# Patient Record
Sex: Female | Born: 1960 | Race: Black or African American | Hispanic: No | Marital: Single | Smoking: Never smoker
Health system: Southern US, Community
[De-identification: ages and names within clinical notes are randomized; demographics above are authoritative.]

## PROBLEM LIST (undated history)

## (undated) DIAGNOSIS — I639 Cerebral infarction, unspecified: Secondary | ICD-10-CM

## (undated) DIAGNOSIS — T7840XA Allergy, unspecified, initial encounter: Secondary | ICD-10-CM

## (undated) DIAGNOSIS — J309 Allergic rhinitis, unspecified: Secondary | ICD-10-CM

## (undated) DIAGNOSIS — E559 Vitamin D deficiency, unspecified: Secondary | ICD-10-CM

## (undated) DIAGNOSIS — I10 Essential (primary) hypertension: Secondary | ICD-10-CM

## (undated) HISTORY — DX: Vitamin D deficiency, unspecified: E55.9

## (undated) HISTORY — DX: Allergy, unspecified, initial encounter: T78.40XA

## (undated) HISTORY — DX: Allergic rhinitis, unspecified: J30.9

## (undated) HISTORY — DX: Essential (primary) hypertension: I10

## (undated) HISTORY — PX: TOTAL ABDOMINAL HYSTERECTOMY W/ BILATERAL SALPINGOOPHORECTOMY: SHX83

## (undated) HISTORY — PX: ABDOMINAL HYSTERECTOMY: SHX81

## (undated) HISTORY — DX: Cerebral infarction, unspecified: I63.9

---

## 1998-02-22 ENCOUNTER — Emergency Department (HOSPITAL_COMMUNITY): Admission: EM | Admit: 1998-02-22 | Discharge: 1998-02-22 | Payer: Self-pay | Admitting: Emergency Medicine

## 1998-02-22 ENCOUNTER — Encounter: Payer: Self-pay | Admitting: Emergency Medicine

## 1999-03-17 ENCOUNTER — Other Ambulatory Visit: Admission: RE | Admit: 1999-03-17 | Discharge: 1999-03-17 | Payer: Self-pay | Admitting: Internal Medicine

## 1999-04-22 ENCOUNTER — Encounter: Payer: Self-pay | Admitting: Internal Medicine

## 1999-04-22 ENCOUNTER — Ambulatory Visit (HOSPITAL_COMMUNITY): Admission: RE | Admit: 1999-04-22 | Discharge: 1999-04-22 | Payer: Self-pay | Admitting: Internal Medicine

## 2002-01-04 ENCOUNTER — Emergency Department (HOSPITAL_COMMUNITY): Admission: EM | Admit: 2002-01-04 | Discharge: 2002-01-04 | Payer: Self-pay | Admitting: Emergency Medicine

## 2002-10-13 ENCOUNTER — Encounter: Admission: RE | Admit: 2002-10-13 | Discharge: 2002-10-13 | Payer: Self-pay | Admitting: Internal Medicine

## 2002-10-13 ENCOUNTER — Encounter: Payer: Self-pay | Admitting: Internal Medicine

## 2004-08-17 ENCOUNTER — Ambulatory Visit: Payer: Self-pay | Admitting: Internal Medicine

## 2004-11-24 ENCOUNTER — Ambulatory Visit: Payer: Self-pay | Admitting: Internal Medicine

## 2005-05-02 ENCOUNTER — Ambulatory Visit: Payer: Self-pay | Admitting: Internal Medicine

## 2005-05-25 ENCOUNTER — Ambulatory Visit: Payer: Self-pay | Admitting: Internal Medicine

## 2005-08-24 ENCOUNTER — Ambulatory Visit: Payer: Self-pay | Admitting: Internal Medicine

## 2005-09-27 ENCOUNTER — Ambulatory Visit: Payer: Self-pay | Admitting: Internal Medicine

## 2006-02-13 ENCOUNTER — Ambulatory Visit: Payer: Self-pay | Admitting: Internal Medicine

## 2006-07-06 ENCOUNTER — Ambulatory Visit: Payer: Self-pay | Admitting: Internal Medicine

## 2006-11-07 ENCOUNTER — Ambulatory Visit: Payer: Self-pay | Admitting: Internal Medicine

## 2007-03-19 ENCOUNTER — Ambulatory Visit: Payer: Self-pay | Admitting: Internal Medicine

## 2007-04-16 ENCOUNTER — Ambulatory Visit: Payer: Self-pay | Admitting: Internal Medicine

## 2007-06-04 ENCOUNTER — Encounter: Admission: RE | Admit: 2007-06-04 | Discharge: 2007-06-04 | Payer: Self-pay | Admitting: Internal Medicine

## 2007-08-05 ENCOUNTER — Ambulatory Visit: Payer: Self-pay | Admitting: Internal Medicine

## 2007-10-11 DIAGNOSIS — J3089 Other allergic rhinitis: Secondary | ICD-10-CM

## 2007-10-11 DIAGNOSIS — J45998 Other asthma: Secondary | ICD-10-CM

## 2007-10-11 DIAGNOSIS — L259 Unspecified contact dermatitis, unspecified cause: Secondary | ICD-10-CM | POA: Insufficient documentation

## 2007-10-11 DIAGNOSIS — J302 Other seasonal allergic rhinitis: Secondary | ICD-10-CM

## 2007-10-14 ENCOUNTER — Ambulatory Visit: Payer: Self-pay | Admitting: Internal Medicine

## 2008-01-06 ENCOUNTER — Ambulatory Visit: Payer: Self-pay | Admitting: Internal Medicine

## 2008-05-14 ENCOUNTER — Encounter: Payer: Self-pay | Admitting: Internal Medicine

## 2008-06-02 ENCOUNTER — Ambulatory Visit: Payer: Self-pay | Admitting: Internal Medicine

## 2008-07-08 ENCOUNTER — Ambulatory Visit: Payer: Self-pay | Admitting: Internal Medicine

## 2008-07-10 ENCOUNTER — Ambulatory Visit: Payer: Self-pay | Admitting: Internal Medicine

## 2008-07-12 ENCOUNTER — Emergency Department (HOSPITAL_COMMUNITY): Admission: EM | Admit: 2008-07-12 | Discharge: 2008-07-12 | Payer: Self-pay | Admitting: Emergency Medicine

## 2008-07-13 ENCOUNTER — Ambulatory Visit: Payer: Self-pay | Admitting: Internal Medicine

## 2008-07-13 LAB — CONVERTED CEMR LAB
BUN: 15 mg/dL (ref 6–23)
Basophils Absolute: 0 10*3/uL (ref 0.0–0.1)
Basophils Relative: 0 % (ref 0.0–3.0)
CO2: 27 meq/L (ref 19–32)
Calcium: 10.4 mg/dL (ref 8.4–10.5)
Chloride: 96 meq/L (ref 96–112)
Creatinine, Ser: 0.7 mg/dL (ref 0.4–1.2)
Eosinophils Absolute: 0 10*3/uL (ref 0.0–0.7)
Eosinophils Relative: 0.1 % (ref 0.0–5.0)
GFR calc Af Amer: 114 mL/min
GFR calc non Af Amer: 95 mL/min
Glucose, Bld: 102 mg/dL — ABNORMAL HIGH (ref 70–99)
HCT: 42.1 % (ref 36.0–46.0)
Hemoglobin: 14.3 g/dL (ref 12.0–15.0)
Lymphocytes Relative: 17.9 % (ref 12.0–46.0)
MCHC: 34 g/dL (ref 30.0–36.0)
MCV: 82.6 fL (ref 78.0–100.0)
Monocytes Absolute: 0.8 10*3/uL (ref 0.1–1.0)
Monocytes Relative: 6.6 % (ref 3.0–12.0)
Neutro Abs: 8.8 10*3/uL — ABNORMAL HIGH (ref 1.4–7.7)
Neutrophils Relative %: 75.4 % (ref 43.0–77.0)
Platelets: 320 10*3/uL (ref 150–400)
Potassium: 3.5 meq/L (ref 3.5–5.1)
Pro B Natriuretic peptide (BNP): 22 pg/mL (ref 0.0–100.0)
RBC: 5.09 M/uL (ref 3.87–5.11)
RDW: 13.7 % (ref 11.5–14.6)
Sodium: 135 meq/L (ref 135–145)
WBC: 11.7 10*3/uL — ABNORMAL HIGH (ref 4.5–10.5)

## 2008-07-15 ENCOUNTER — Telehealth: Payer: Self-pay | Admitting: Internal Medicine

## 2008-07-26 DIAGNOSIS — I10 Essential (primary) hypertension: Secondary | ICD-10-CM | POA: Insufficient documentation

## 2008-08-08 ENCOUNTER — Encounter: Payer: Self-pay | Admitting: Internal Medicine

## 2008-08-13 ENCOUNTER — Telehealth: Payer: Self-pay | Admitting: Internal Medicine

## 2008-08-17 ENCOUNTER — Ambulatory Visit: Payer: Self-pay | Admitting: Internal Medicine

## 2008-10-13 ENCOUNTER — Ambulatory Visit: Payer: Self-pay | Admitting: Internal Medicine

## 2008-12-15 ENCOUNTER — Ambulatory Visit: Payer: Self-pay | Admitting: Internal Medicine

## 2009-02-12 ENCOUNTER — Emergency Department (HOSPITAL_COMMUNITY): Admission: EM | Admit: 2009-02-12 | Discharge: 2009-02-12 | Payer: Self-pay | Admitting: Emergency Medicine

## 2009-02-15 ENCOUNTER — Emergency Department (HOSPITAL_COMMUNITY): Admission: EM | Admit: 2009-02-15 | Discharge: 2009-02-15 | Payer: Self-pay | Admitting: Family Medicine

## 2009-02-19 ENCOUNTER — Emergency Department (HOSPITAL_COMMUNITY): Admission: EM | Admit: 2009-02-19 | Discharge: 2009-02-19 | Payer: Self-pay | Admitting: Family Medicine

## 2009-02-22 ENCOUNTER — Ambulatory Visit: Payer: Self-pay | Admitting: Internal Medicine

## 2009-02-26 ENCOUNTER — Emergency Department (HOSPITAL_COMMUNITY): Admission: EM | Admit: 2009-02-26 | Discharge: 2009-02-26 | Payer: Self-pay | Admitting: Family Medicine

## 2009-04-14 ENCOUNTER — Ambulatory Visit: Payer: Self-pay | Admitting: Internal Medicine

## 2009-06-17 ENCOUNTER — Emergency Department (HOSPITAL_COMMUNITY): Admission: EM | Admit: 2009-06-17 | Discharge: 2009-06-17 | Payer: Self-pay | Admitting: Family Medicine

## 2009-06-18 ENCOUNTER — Ambulatory Visit (HOSPITAL_COMMUNITY): Admission: RE | Admit: 2009-06-18 | Discharge: 2009-06-18 | Payer: Self-pay | Admitting: Gastroenterology

## 2009-06-30 ENCOUNTER — Encounter: Payer: Self-pay | Admitting: Internal Medicine

## 2009-07-28 ENCOUNTER — Ambulatory Visit: Payer: Self-pay | Admitting: Internal Medicine

## 2009-08-12 ENCOUNTER — Ambulatory Visit: Payer: Self-pay | Admitting: Internal Medicine

## 2009-12-01 ENCOUNTER — Ambulatory Visit: Payer: Self-pay | Admitting: Internal Medicine

## 2009-12-06 ENCOUNTER — Ambulatory Visit: Payer: Self-pay | Admitting: Internal Medicine

## 2010-02-11 ENCOUNTER — Ambulatory Visit: Payer: Self-pay | Admitting: Internal Medicine

## 2010-02-11 DIAGNOSIS — K219 Gastro-esophageal reflux disease without esophagitis: Secondary | ICD-10-CM

## 2010-03-16 ENCOUNTER — Ambulatory Visit: Payer: Self-pay | Admitting: Internal Medicine

## 2010-03-30 ENCOUNTER — Encounter: Payer: Self-pay | Admitting: Internal Medicine

## 2010-04-12 ENCOUNTER — Ambulatory Visit: Payer: Self-pay | Admitting: Internal Medicine

## 2010-06-01 ENCOUNTER — Ambulatory Visit: Admit: 2010-06-01 | Payer: Self-pay | Admitting: Internal Medicine

## 2010-06-02 ENCOUNTER — Telehealth: Payer: Self-pay | Admitting: Internal Medicine

## 2010-06-28 NOTE — Miscellaneous (Signed)
Summary: Injection Record / [facility]    Injection Record / [facility]    Imported By: Lennie Odor 10/19/2009 15:11:40  _____________________________________________________________________  External Attachment:    Type:   Image     Comment:   External Document

## 2010-06-28 NOTE — Miscellaneous (Signed)
Summary: Injection Record/[facility]  Injectio Record/ Allergy   Imported By: Sherian Rein 10/19/2009 08:42:58  _____________________________________________________________________  External Attachment:    Type:   Image     Comment:   External Document

## 2010-06-28 NOTE — Miscellaneous (Signed)
Summary: Injection Financial risk analyst   Imported By: Sherian Rein 04/20/2010 14:16:43  _____________________________________________________________________  External Attachment:    Type:   Image     Comment:   External Document

## 2010-06-28 NOTE — Miscellaneous (Signed)
Summary: Injection record/[facility]  Injection record/[facility]   Imported By: Lester Shiner 12/22/2009 09:36:46  _____________________________________________________________________  External Attachment:    Type:   Image     Comment:   External Document

## 2010-06-28 NOTE — Assessment & Plan Note (Signed)
Summary: rov/am appt/apc   Primary Provider/Referring Provider:  Kellie Shropshire  CC:  Follow up visit-allergies; having some chest tightness, strachy throat, sinus pressure, and watery eyes., and Hypertension Management.  History of Present Illness: April 14, 2009- Asthma, allergic rhinitis, Hx eczema Did well through the summer til now, but in the last week she has felt chest tight without wheeze and without obvious cold or head congestion. We reviewed her meds. She did get a flu shot.  August 12, 2009- Asthma, allergic rhinitis, Hx eczema She had increased wheeze with a viral illness 2-3 weeks ago, but fought it off with her own meds. Still a little tired but otw feels well. No significant pollen discomfort yet.  Continues allergy vaccine doing well, needing epipen refill to update.  Asks refill prednisone to hold.  December 06, 2009--Presents for an acute office visit. Complains that she has had a cold w/ sinus congestion, drianage, ear pressure for 3 weeks, Now over last week, cough and wheezing are acting up.  Denies chest pain,  orthopnea, hemoptysis, fever, n/v/d, edema, headache. No otc used.   February 11, 2010- Asthma, allergic rhiniitis, Hx eczema Got help here and resolved sinusitis in [place]. Now in her worst season with itching eyes, scratchy throat, chest tight and wheeze.  Using Advair two times a day. Pat 2 weeks needing rescue inhaler several times daily. Thinks she may have to take a little time off at work due to tired.  Feeling more heartburn treated with Tums.    Asthma History    Initial Asthma Severity Rating:    Age range: 12+ years    Symptoms: daily    Nighttime Awakenings: 0-2/month    Interferes w/ normal activity: some limitations    SABA use (not for EIB): several times per day    Asthma Severity Assessment: Severe Persistent  Hypertension History:      Positive major cardiovascular risk factors include hypertension.  Negative major cardiovascular risk  factors include female age less than 25 years old and non-tobacco-user status.     Preventive Screening-Counseling & Management  Alcohol-Tobacco     Smoking Status: never  Current Medications (verified): 1)  Advair Diskus 500-50 Mcg/dose  Misc (Fluticasone-Salmeterol) .Marland Kitchen.. 1 Puff and Rinse, Twice Daily 2)  Proair Hfa 108 (90 Base) Mcg/act  Aers (Albuterol Sulfate) .... 2 Puffs Four Times A Day As Needed 3)  Claritin 10 Mg  Tabs (Loratadine) .... Take 1 By Mouth Once Daily 4)  Flonase 50 Mcg/act  Susp (Fluticasone Propionate) .... Use As Directed As Needed 5)  Albuterol Sulfate (2.5 Mg/31ml) 0.083%  Nebu (Albuterol Sulfate) .Marland Kitchen.. 1 Neb Four Times A Day As Needed 6)  Triamterene-Hctz 37.5-25 Mg  Tabs (Triamterene-Hctz) .... Take 1 By Mouth Once Daily 7)  Allergy Vaccine 1:10 Go (W-E) .... Once A Week 8)  Epipen 0.3 Mg/0.2ml (1:1000)  Devi (Epinephrine Hcl (Anaphylaxis)) .... For Severe Allergic Reaction 9)  Multivitamins   Tabs (Multiple Vitamin) .... Take 1 By Mouth Once Daily 10)  Aleve 220 Mg  Tabs (Naproxen Sodium) .... As Needed 11)  Fish Oil 1000 Mg Caps (Omega-3 Fatty Acids) .... Take 1 By Mouth Once Daily  Allergies (verified): 1)  ! 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  House, has cat and outside dog.  Risk Factors: Smoking Status: never (02/11/2010)  Review of Systems      See HPI       The patient complains of shortness of breath with activity,  non-productive cough, acid heartburn, indigestion, and nasal congestion/difficulty breathing through nose.  The patient denies shortness of breath at rest, productive cough, coughing up blood, chest pain, irregular heartbeats, loss of appetite, weight change, abdominal pain, difficulty swallowing, sore throat, tooth/dental problems, headaches, and sneezing.    Vital Signs:  Patient profile:   50 year old female Height:      64 inches Weight:      169.13 pounds BMI:     29.14 O2 Sat:      98 % on Room air Pulse rate:   98 / minute BP sitting:   148 / 82  (left arm) Cuff size:   regular  Vitals Entered By: Reynaldo Minium CMA (February 11, 2010 10:31 AM)  O2 Flow:  Room air CC: Follow up visit-allergies; having some chest tightness, strachy throat, sinus pressure, and watery eyes., Hypertension Management   Physical Exam  Additional Exam:  General: A/Ox3; pleasant and cooperative, NAD, calm, unlabored SKIN: no rash, lesions NODES: no lymphadenopathy HEENT: [place]/AT, EOM- WNL, Conjuctivae- pale w/ clear mucus.  PERRLA, TM-WNL, Nose- clear, Throat- clear and wnl, Mallampati III, residual tonsils, no drainage NECK: Supple w/ fair ROM, JVD- none, normal carotid impulses w/o bruits Thyroid-  CHEST: Coarse BS w/ no wheeizng.  HEART: RRR, no m/g/r heard ABDOMEN: soft nt  ELF:YBOF, nl pulses, no edema  NEURO: Grossly intact to observation      Impression & Recommendations:  Problem # 1:  ASTHMA (ICD-493.90) She happens to be clear right now but gives good hx for an exacerbation she attributes to the seasonal factors. She is not itching or sneezing and I'm nbot sure how much is really allergic, especially since she is noting more heartburn and some penetration during eating. Management of LPR was discussed.  She will be off work this weekend, but may need a note for a couple f days off next week if she is slow to improve.  Problem # 2:  GERD (ICD-530.81)  This may be an aggravating  factor or incidental. I wll have her take otc pepcid.  Orders: Est. Patient Level IV (75102)  Problem # 3:  ALLERGIC RHINITIS (ICD-477.9)  Noting nasal stuffines which should improve with prednisone. Her updated medication list for this problem includes:    Claritin 10 Mg Tabs (Loratadine) .Marland Kitchen... Take 1 by mouth once daily    Flonase 50 Mcg/act Susp (Fluticasone propionate) ..... Use as directed as needed  Orders: Est. Patient Level IV (58527)  Medications Added to Medication List This Visit: 1)  Prednisone 10 Mg Tabs (Prednisone) .Marland Kitchen.. 1 tab four times daily x 2 days, 3 times daily x 2 days, 2 times daily x 2 days, 1 time daily x 2 days  Hypertension Assessment/Plan:      The patient's hypertensive risk group is category A: No risk factors and no target organ damage.  Today's blood pressure is 148/82.     Patient Instructions: 1)  Please schedule a follow-up appointment in 1 month. 2)  Script sent for prednisone  taper 3)  Try otc Pepcid 20 mg, once every day before breakfast. See if this stops the heartburn. Prescriptions: PREDNISONE 10 MG TABS (PREDNISONE) 1 tab four times daily x 2 days, 3 times daily x 2 days, 2 times daily x 2 days, 1 time daily x 2 days  #20 x 0   Entered and Authorized by:   Waymon Budge MD   Signed by:   Waymon Budge MD on 02/11/2010   Method used:   Electronically to        [facility]* (retail)       [address]       [address] Shipping/mailing       [place], [place]  [address]       Ph: [phone]       Fax: [phone]   RxID:   [id number]

## 2010-06-28 NOTE — Assessment & Plan Note (Signed)
Summary: Acute NP office visit - asthma   Primary Provider/Referring Provider:  Kellie Shropshire  CC:  wheezing, increased SOB, occ dry cough, and chills x2-3weeks.  History of Present Illness: 12/15/08- Asthma, allergic rhinitis, Hx eczema Overall she has been doing better. In past 3 weeks increased cough and tightness but better in last few days. Weather/ hukmidity bothers, and may have had a cold last week. Lying down gets some postnasal drip. not congested in head but occasionally headache. Sneezing. Denies ear pain or sore throat. No fever. Using flonase - does help. Doing well with allergy shots.  April 14, 2009- Asthma, allergic rhinitis, Hx eczema Did well through the summer til now, but in the last week she has felt chest tight without wheeze and without obvious cold or head congestion. We reviewed her meds. She did get a flu shot.  August 12, 2009- Asthma, allergic rhinitis, Hx eczema She had increased wheeze with a viral illness 2-3 weeks ago, but fought it off with her own meds. Still a little tired but otw feels well. No significant pollen discomfort yet.  Continues allergy vaccine doing well, needing epipen refill to update.  Asks refill prednisone to hold.  December 06, 2009--Presents for an acute office visit. Complains that she has had a cold w/ sinus congestion, drianage, ear pressure for 3 weeks, Now over last week, cough and wheezing are acting up.  Denies chest pain,  orthopnea, hemoptysis, fever, n/v/d, edema, headache. No otc used.    Medications Prior to Update: 1)  Advair Diskus 500-50 Mcg/dose  Misc (Fluticasone-Salmeterol) .Marland Kitchen.. 1 Puff and Rinse, Twice Daily 2)  Proair Hfa 108 (90 Base) Mcg/act  Aers (Albuterol Sulfate) .... 2 Puffs Four Times A Day As Needed 3)  Claritin 10 Mg  Tabs (Loratadine) .... Take 1 By Mouth Once Daily 4)  Flonase 50 Mcg/act  Susp (Fluticasone Propionate) .... Use As Directed As Needed 5)  Albuterol Sulfate (2.5 Mg/71ml) 0.083%  Nebu (Albuterol  Sulfate) .Marland Kitchen.. 1 Neb Four Times A Day As Needed 6)  Triamterene-Hctz 37.5-25 Mg  Tabs (Triamterene-Hctz) .... Take 1 By Mouth Once Daily 7)  Allergy Vaccine 1:10 Go (W-E) 8)  Epipen 0.3 Mg/0.56ml (1:1000)  Devi (Epinephrine Hcl (Anaphylaxis)) .... For Severe Allergic Reaction 9)  Multivitamins   Tabs (Multiple Vitamin) .... Take 1 By Mouth Once Daily 10)  Aleve 220 Mg  Tabs (Naproxen Sodium) .... As Needed 11)  Fish Oil 1000 Mg Caps (Omega-3 Fatty Acids) .... Take 1 By Mouth Once Daily 12)  Prednisone 10 Mg Tabs (Prednisone) .Marland Kitchen.. 1 Tab Four Times Daily X 2 Days, 3 Times Daily X 2 Days, 2 Times Daily X 2 Days, 1 Time Daily X 2 Days  Current Medications (verified): 1)  Advair Diskus 500-50 Mcg/dose  Misc (Fluticasone-Salmeterol) .Marland Kitchen.. 1 Puff and Rinse, Twice Daily 2)  Proair Hfa 108 (90 Base) Mcg/act  Aers (Albuterol Sulfate) .... 2 Puffs Four Times A Day As Needed 3)  Claritin 10 Mg  Tabs (Loratadine) .... Take 1 By Mouth Once Daily 4)  Flonase 50 Mcg/act  Susp (Fluticasone Propionate) .... Use As Directed As Needed 5)  Albuterol Sulfate (2.5 Mg/4ml) 0.083%  Nebu (Albuterol Sulfate) .Marland Kitchen.. 1 Neb Four Times A Day As Needed 6)  Triamterene-Hctz 37.5-25 Mg  Tabs (Triamterene-Hctz) .... Take 1 By Mouth Once Daily 7)  Allergy Vaccine 1:10 Go (W-E) .... Once A Week 8)  Epipen 0.3 Mg/0.78ml (1:1000)  Devi (Epinephrine Hcl (Anaphylaxis)) .... For Severe Allergic Reaction 9)  Multivitamins  Tabs (Multiple Vitamin) .... Take 1 By Mouth Once Daily 10)  Aleve 220 Mg  Tabs (Naproxen Sodium) .... As Needed 11)  Fish Oil 1000 Mg Caps (Omega-3 Fatty Acids) .... Take 1 By Mouth Once Daily  Allergies (verified): 1)  ! 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  Risk Factors: Smoking Status: never (02-Nov-2007)  Review of Systems      See HPI  Vital Signs:  Patient profile:   50 year old female Height:      64 inches Weight:      166.38 pounds BMI:     28.66 O2 Sat:      99 % on Room air Temp:     97.8 degrees F oral Pulse rate:   85 / minute BP sitting:   134 / 84  (right arm) Cuff size:   regular  Vitals Entered By: Boone Master CNA/MA (December 06, 2009 11:21 AM)  O2 Flow:  Room air  Physical Exam  Additional Exam:  General: A/Ox3; pleasant and cooperative, NAD, calm, unlabored SKIN: no rash, lesions NODES: no lymphadenopathy HEENT: [place]/AT, EOM- WNL, Conjuctivae- pale w/ clear mucus.  PERRLA, TM-WNL, Nose- clear, Throat- clear and wnl, Mallampati III, residual tonsils, no drainage NECK: Supple w/ fair ROM, JVD- none, normal carotid impulses w/o bruits Thyroid-  CHEST: Coarse BS w/ no wheeizng.  HEART: RRR, no m/g/r heard ABDOMEN: soft nt  ZOX:WRUE, nl pulses, no edema  NEURO: Grossly intact to observation      Impression & Recommendations:  Problem # 1:  ASTHMA (ICD-493.90)  AEAB exacerbation REC;  Zpack take as directed .  Mucinex DM two times a day as needed cough/congesiton  Saline nasal rinses as needed  Prednisone taper over next week.  Please contact office for sooner follow up if symptoms do not improve or worsen  The following medications were removed from the medication list:    Prednisone 10 Mg Tabs (Prednisone) .Marland Kitchen... 1 tab four times daily x 2 days, 3 times daily x 2 days, 2 times daily x 2 days, 1 time daily x 2 days Her updated medication list for this problem includes:    Claritin 10 Mg Tabs  (Loratadine) .Marland Kitchen... Take 1 by mouth once daily    Prednisone 10 Mg Tabs (Prednisone) .Marland KitchenMarland KitchenMarland KitchenMarland Kitchen 4 tabs for 2 days, then 3 tabs for 2 days, 2 tabs for 2 days, then 1 tab for 2 days, then stop  Orders: Nebulizer Tx (45409) Est. Patient Level IV (81191)  Medications Added to Medication List This Visit: 1)  Allergy Vaccine 1:10 Go (w-e)  .... Once a week 2)  Zithromax Z-pak 250 Mg Tabs (Azithromycin) .... Take as directed 3)  Prednisone 10 Mg Tabs (Prednisone) .... 4 tabs for 2 days, then 3 tabs for 2 days, 2 tabs for 2 days, then 1 tab for 2 days, then stop  Complete Medication List: 1)  Advair Diskus 500-50 Mcg/dose Misc (Fluticasone-salmeterol) .Marland Kitchen.. 1 puff and rinse,  twice daily 2)  Proair Hfa 108 (90 Base) Mcg/act Aers (Albuterol sulfate) .... 2 puffs four times a day as needed 3)  Claritin 10 Mg Tabs (Loratadine) .... Take 1 by mouth once daily 4)  Flonase 50 Mcg/act Susp (Fluticasone propionate) .... Use as directed as needed 5)  Albuterol Sulfate (2.5 Mg/9ml) 0.083% Nebu (Albuterol sulfate) .Marland Kitchen.. 1 neb four times a day as needed 6)  Triamterene-hctz 37.5-25 Mg Tabs (Triamterene-hctz) .... Take 1 by mouth once daily 7)  Allergy Vaccine 1:10 Go (w-e)  .... Once a week 8)  Epipen 0.3 Mg/0.95ml (1:1000) Devi (Epinephrine hcl (anaphylaxis)) .... For severe allergic reaction 9)  Multivitamins Tabs (Multiple vitamin) .... Take 1 by mouth once daily 10)  Aleve 220 Mg Tabs (Naproxen sodium) .... As needed 11)  Fish Oil 1000 Mg Caps (Omega-3 fatty acids) .... Take 1 by mouth once daily 12)  Zithromax Z-pak 250 Mg Tabs (Azithromycin) .... Take as directed 13)  Prednisone 10 Mg Tabs (Prednisone) .... 4 tabs for 2 days, then 3 tabs for 2 days, 2 tabs for 2 days, then 1 tab for 2 days, then stop  Patient Instructions: 1)  Zpack take as directed .  2)  Mucinex DM two times a day as needed cough/congesiton  3)  Saline nasal rinses as needed  4)  Prednisone taper over next week.  5)  Please contact office  for sooner follow up if symptoms do not improve or worsen  Prescriptions: PREDNISONE 10 MG TABS (PREDNISONE) 4 tabs for 2 days, then 3 tabs for 2 days, 2 tabs for 2 days, then 1 tab for 2 days, then stop  #1 x 0   Entered and Authorized by:   Rubye Oaks NP   Signed by:   Tammy Parrett NP on 12/06/2009   Method used:   Electronically to        [id number]* (retail)       [address] [place], [place]  [address]       Ph: [phone]       Fax: [phone]   RxID:   [id number] ZITHROMAX Z-PAK 250 MG TABS (AZITHROMYCIN) take as directed  #1 x 0   Entered and Authorized by:   Rubye Oaks NP   Signed by:   Tammy Parrett NP on 12/06/2009   Method used:   Electronically to        [id number]* (retail)       [address] [place], [place]  [address]       Ph: [phone]       Fax: [phone]   RxID:   [id number]    Immunization History:  Influenza Immunization History:    Influenza:  historical (02/26/2009)

## 2010-06-28 NOTE — Assessment & Plan Note (Signed)
Summary: 1 month/apc   Primary Heidi Tran/Referring Heidi Tran:  Heidi Tran  CC:  1 month follow up visit-allergies and asthma-SOB, fatigued, and chest tightness; need RX for allergy needles..  History of Present Illness: August 12, 2009- Asthma, allergic rhinitis, Hx eczema She had increased wheeze with a viral illness 2-3 weeks ago, but fought it off with her own meds. Still a little tired but otw feels well. No significant pollen discomfort yet.  Continues allergy vaccine doing well, needing epipen refill to update.  Asks refill prednisone to hold.  December 06, 2009--Presents for an acute office visit. Complains that she has had a cold w/ sinus congestion, drianage, ear pressure for 3 weeks, Now over last week, cough and wheezing are acting up.  Denies chest pain,  orthopnea, hemoptysis, fever, n/v/d, edema, headache. No otc used.   February 11, 2010- Asthma, allergic rhiniitis, Hx eczema Got help here and resolved sinusitis in [place]. Now in her worst season with itching eyes, scratchy throat, chest tight and wheeze.  Using Advair two times a day. Pat 2 weeks needing rescue inhaler several times daily. Thinks she may have to take a little time off at work due to tired.  Feeling more heartburn treated with Tums.   March 16, 2010- Asthma, allergic rhiniitis, Hx eczema Nurse cc: Asthma, allergic rhiniitis, Hx eczema Has been more tight in last month, putting off calling me, but having to miss some work. Finds she can't walk the distance now that parking at work is changed. Using rescue inhaler at least once daily and nebulizer 4-5 x/ this week. No definite cold/ infection. Did have nasal congestion. Using flonase and saline spray. She continues allergy vaccine with no problems.   Asthma History    Asthma Control Assessment:    Age range: 12+ years    Symptoms: throughout the day    Nighttime Awakenings: 1-3/week    Interferes w/ normal activity: some limitations    SABA use (not for EIB):  several times per day    Asthma Control Assessment: Very Poorly Controlled   Preventive Screening-Counseling & Management  Alcohol-Tobacco     Smoking Status: never  Current Medications (verified): 1)  Advair Diskus 500-50 Mcg/dose  Misc (Fluticasone-Salmeterol) .Marland Kitchen.. 1 Puff and Rinse, Twice Daily 2)  Proair Hfa 108 (90 Base) Mcg/act  Aers (Albuterol Sulfate) .... 2 Puffs Four Times A Day As Needed 3)  Claritin 10 Mg  Tabs (Loratadine) .... Take 1 By Mouth Once Daily 4)  Flonase 50 Mcg/act  Susp (Fluticasone Propionate) .... Use As Directed As Needed 5)  Albuterol Sulfate (2.5 Mg/32ml) 0.083%  Nebu (Albuterol Sulfate) .Marland Kitchen.. 1 Neb Four Times A Day As Needed 6)  Triamterene-Hctz 37.5-25 Mg  Tabs (Triamterene-Hctz) .... Take 1 By Mouth Once Daily 7)  Allergy Vaccine 1:10 Go (W-E) .... Once A Week 8)  Epipen 0.3 Mg/0.71ml (1:1000)  Devi (Epinephrine Hcl (Anaphylaxis)) .... For Severe Allergic Reaction 9)  Multivitamins   Tabs (Multiple Vitamin) .... Take 1 By Mouth Once Daily 10)  Aleve 220 Mg  Tabs (Naproxen Sodium) .... As Needed 11)  Fish Oil 1000 Mg Caps (Omega-3 Fatty Acids) .... Take 1 By Mouth Once Daily  Allergies (verified): 1)  ! 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  House, has cat and outside dog.  Risk Factors: Smoking Status: never (03/16/2010)  Review of Systems      See HPI       The patient complains of shortness of breath with activity.  The  patient denies shortness of breath at rest, productive cough, non-productive cough, coughing up blood, chest pain, irregular heartbeats, acid heartburn, indigestion, loss of appetite, weight change, abdominal pain, difficulty swallowing, sore throat, tooth/dental problems, headaches, nasal congestion/difficulty breathing through nose, and sneezing.    Vital Signs:  Patient profile:   50 year old female Height:      64 inches Weight:      171.13 pounds BMI:     29.48 O2 Sat:      99 % on Room air Pulse rate:   82 / minute BP sitting:   124 / 82  (left arm) Cuff size:   regular  Vitals Entered By: Reynaldo Minium CMA (March 16, 2010 10:36 AM)  O2 Flow:  Room air CC: 1 month follow up visit-allergies and asthma-SOB, fatigued, chest tightness; need RX for allergy needles.   Physical Exam  Additional Exam:  General: A/Ox3; pleasant and cooperative, NAD, calm, unlabored SKIN: no rash, lesions NODES: no lymphadenopathy HEENT: [place]/AT, EOM- WNL, Conjuctivae- pale w/ clear mucus.  PERRLA, TM-WNL, Nose- clear, Throat- clear and wnl, Mallampati III, residual tonsils, no drainage NECK: Supple w/ fair ROM, JVD- none, normal carotid impulses w/o bruits Thyroid-  CHEST: Coarse BS w/ no wheeizng.  HEART: RRR, no m/g/r heard ABDOMEN: soft nt  ZOX:WRUE, nl pulses, no edema  NEURO: Grossly intact to observation      Impression & Recommendations:  Problem # 1:  ASTHMA (ICD-493.90) She describes substantially more difficulty over the Fall season although exam today seems unremarkable and unchanged from last visit. I will have her try Spiriva. She didn't see benefit from Singulair before. We will add a maintenance prednisone dose for a while and give a handicapped parking card   Problem # 2:  ALLERGIC RHINITIS (ICD-477.9) Increased nasal symptoms parallel her lower respiratory complaints, but not as troublesome for her. We wreviewed available otc therapies. Her updated medication list for this  problem includes:    Claritin 10 Mg Tabs (Loratadine) .Marland Kitchen... Take 1 by mouth once daily    Flonase 50 Mcg/act Susp (Fluticasone propionate) ..... Use as directed as needed  Problem # 3:  ECZEMA (ICD-692.9) Assessment: Comment Only  The following medications were removed from the medication list:    Prednisone 10 Mg Tabs (Prednisone) .Marland Kitchen... 1 tab four times daily x 2 days, 3 times daily x 2 days, 2 times daily x 2 days, 1 time daily x 2 days Her updated medication list for this problem includes:    Claritin 10 Mg Tabs (Loratadine) .Marland Kitchen... Take 1 by mouth once daily    Prednisone 10 Mg Tabs (Prednisone) .Marland Kitchen... 1 daily  Medications Added to Medication List This Visit: 1)  Prednisone 10 Mg Tabs (Prednisone) .Marland Kitchen.. 1 daily  Other Orders: Est. Patient Level IV (45409)  Patient Instructions: 1)  Please schedule a follow-up appointment in 1 month. 2)  HC parking card 3)  Script sent to take prednisone 10 mg daily for stability till yoiu get back. 4)  Sample Spiriva 1 daily Prescriptions: PREDNISONE 10 MG TABS (PREDNISONE) 1 daily  #30 x 1   Entered and Authorized by:   Waymon Budge MD   Signed by:   Waymon Budge MD on 03/16/2010   Method used:   Electronically to        [facility]* (retail)       [address]       [address] Shipping/mailing       [place], [place]  [address]       Ph: [phone]       Fax: [phone]   RxID:   [id number]

## 2010-06-28 NOTE — Assessment & Plan Note (Signed)
Summary: rov 3 months///kp   Primary Provider/Referring Provider:  Kellie Shropshire  CC:  3 month follow up visit-? flu then asthma flare up last week.Heidi Tran  History of Present Illness: 12/15/08- Asthma, allergic rhinitis, Hx eczema Overall she has been doing better. In past 3 weeks increased cough and tightness but better in last few days. Weather/ hukmidity bothers, and may have had a cold last week. Lying down gets some postnasal drip. not congested in head but occasionally headache. Sneezing. Denies ear pain or sore throat. No fever. Using flonase - does help. Doing well with allergy shots.  April 14, 2009- Asthma, allergic rhinitis, Hx eczema Did well through the summer til now, but in the last week she has felt chest tight without wheeze and without obvious cold or head congestion. We reviewed her meds. She did get a flu shot.  August 12, 2009- Asthma, allergic rhinitis, Hx eczema She had increased wheeze with a viral illness 2-3 weeks ago, but fought it off with her own meds. Still a little tired but otw feels well. No significant pollen discomfort yet.  Continues allergy vaccine doing well, needing epipen refill to update.  Asks refill prednisone to hold.    Current Medications (verified): 1)  Advair Diskus 500-50 Mcg/dose  Misc (Fluticasone-Salmeterol) .Heidi Tran.. 1 Puff and Rinse, Twice Daily 2)  Proair Hfa 108 (90 Base) Mcg/act  Aers (Albuterol Sulfate) .... 2 Puffs Four Times A Day As Needed 3)  Claritin 10 Mg  Tabs (Loratadine) .... Take 1 By Mouth Once Daily 4)  Flonase 50 Mcg/act  Susp (Fluticasone Propionate) .... Use As Directed As Needed 5)  Albuterol Sulfate (2.5 Mg/33ml) 0.083%  Nebu (Albuterol Sulfate) .Heidi Tran.. 1 Neb Four Times A Day As Needed 6)  Triamterene-Hctz 37.5-25 Mg  Tabs (Triamterene-Hctz) .... Take 1 By Mouth Once Daily 7)  Allergy Vaccine 1:10 Go (W-E) 8)  Epipen 0.3 Mg/0.86ml (1:1000)  Devi (Epinephrine Hcl (Anaphylaxis)) .... For Severe Allergic Reaction 9)  Multivitamins    Tabs (Multiple Vitamin) .... Take 1 By Mouth Once Daily 10)  Aleve 220 Mg  Tabs (Naproxen Sodium) .... As Needed 11)  Fish Oil 1000 Mg Caps (Omega-3 Fatty Acids) .... Take 1 By Mouth Once Daily  Allergies (verified): 1)  ! 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  House, has cat and outside dog.  Risk Factors: Smoking Status: never (Oct 26, 2007)  Past Surgical History: T A H and B S O Dental extractions  Review of Systems      See HPI  The patient denies anorexia, fever, weight loss, weight gain, vision loss, decreased hearing, hoarseness, chest pain, syncope, dyspnea on exertion, peripheral edema, prolonged cough, headaches, hemoptysis, and severe indigestion/heartburn.    Vital Signs:  Patient profile:   50 year old female Height:      64 inches Weight:      165.13 pounds BMI:     28.45 O2 Sat:      99 % on Room air Pulse rate:   93 / minute BP sitting:   146 / 88  (right arm) Cuff size:   regular  Vitals Entered By: Reynaldo Minium CMA (August 12, 2009 10:37 AM)  O2 Flow:  Room air  Physical Exam  Additional Exam:  General: A/Ox3; pleasant and cooperative, NAD, calm, unlabored SKIN: no rash, lesions NODES: no lymphadenopathy HEENT: [place]/AT, EOM- WNL, Conjuctivae- clear, PERRLA, TM-WNL, Nose- clear, Throat- clear and wnl, Mallampati III, residual tonsils, no drainage NECK: Supple w/ fair ROM, JVD- none, normal carotid impulses w/o bruits Thyroid-  CHEST: Clear to P&A although she feels tight now. O2 SAT REST ROOM AIR NOW IS  99. HEART: RRR, no m/g/r heard ABDOMEN:  QMV:HQIO, nl pulses, no edema  NEURO: Grossly intact to observation      Impression & Recommendations:  Problem # 1:  ALLERGIC RHINITIS (ICD-477.9)  Continues allergy vaccine. management reviewed. Her updated medication list for this problem includes:    Claritin 10 Mg Tabs (Loratadine) .Heidi Tran... Take 1 by mouth once daily    Flonase 50 Mcg/act Susp (Fluticasone propionate) ..... Use as directed as needed  Problem # 2:  ASTHMA (ICD-493.90) Was able to control her asthma during a viral exacerbation, without needing her prednisone taper.  Medications Added to Medication List This Visit: 1)  Prednisone 10 Mg Tabs (Prednisone) .Heidi Tran.. 1 tab four times daily x 2 days, 3 times daily x 2 days, 2 times daily x 2 days, 1 time daily x 2 days  Other Orders: Est. Patient Level III (96295)  Patient Instructions: 1)  Schedule return appointment in August so we can see you at the end of the summer. Earlier if needed. 2)  Refill script for Epipen to keep where you get your allergy shots. 3)  Script for prednisone taper to hold. 4)  Note for close parking at work. Prescriptions: PREDNISONE 10 MG TABS (PREDNISONE) 1 tab four times daily x 2 days, 3 times daily x 2 days, 2 times daily x 2 days, 1 time daily x 2 days  #20 x 0   Entered and Authorized by:   Waymon Budge MD   Signed by:   Waymon Budge MD on 08/12/2009   Method used:   Electronically to        [facility] [id number]* (retail)       [address] [place], [place]  [address]       Ph: [phone]       Fax: [phone]   RxID:   [id number] EPIPEN 0.3 MG/0.3ML (1:1000)  DEVI (EPINEPHRINE HCL (ANAPHYLAXIS)) For severe allergic reaction  #1 x prn   Entered and Authorized by:   Waymon Budge MD   Signed by:   Waymon Budge MD on 08/12/2009   Method used:   Electronically to        [facility] [address] [facility] [id number]* (retail)       [address] [place], [place]   [address]       Ph: [phone]       Fax: [phone]   RxID:   [id number]

## 2010-06-28 NOTE — Miscellaneous (Signed)
Summary: Injection Record/[facility]  Injection Record/[facility]   Imported By: Sherian Rein 10/19/2009 12:15:36  _____________________________________________________________________  External Attachment:    Type:   Image     Comment:   External Document

## 2010-06-30 NOTE — Progress Notes (Signed)
Summary: nos appt  Phone Note Call from Patient   Caller: [email or web address]  Call For: young Summary of Call: LMTCB x2 to rsc nos from 1/4. Initial call taken by: Darletta Moll,  June 02, 2010 3:53 PM

## 2010-07-13 ENCOUNTER — Ambulatory Visit: Payer: Self-pay | Admitting: Internal Medicine

## 2010-07-14 ENCOUNTER — Telehealth: Payer: Self-pay | Admitting: Internal Medicine

## 2010-07-20 NOTE — Progress Notes (Signed)
Summary: nos appt  Phone Note Call from Patient   Caller: [email or web address]  Call For: Jaelene Garciagarcia Summary of Call: ATC pt to rsc nos from 2/15 phone disconnected//jd Initial call taken by: Darletta Moll,  July 14, 2010 10:16 AM

## 2010-08-25 ENCOUNTER — Other Ambulatory Visit: Payer: Self-pay | Admitting: *Deleted

## 2010-08-25 NOTE — Telephone Encounter (Signed)
Katie denied refill request on 07/26/2010 and said pt needed OV first. There are no pending appts for this patient.

## 2010-09-07 ENCOUNTER — Other Ambulatory Visit: Payer: Self-pay | Admitting: Internal Medicine

## 2010-09-09 ENCOUNTER — Ambulatory Visit (INDEPENDENT_AMBULATORY_CARE_PROVIDER_SITE_OTHER): Payer: Self-pay

## 2010-09-09 DIAGNOSIS — J309 Allergic rhinitis, unspecified: Secondary | ICD-10-CM

## 2010-10-11 NOTE — Assessment & Plan Note (Signed)
[facility]                             PULMONARY OFFICE NOTE   Heidi Tran, Heidi Tran                       MRN:          [id number]  DATE:04/16/2007                            DOB:          1961-02-05    PROBLEM LIST:  1. Asthma.  2. Allergic rhinitis.  3. Eczema.   HISTORY:  She was last seen over a year ago but comes now reporting  asthma flare over the past five days.  She had to miss two days at work.  Taking Claritin-D.  She asks about trying Singulair again.  Nothing  purulent currently.   MEDICATIONS:  1. Advair 250/50.  2. Triamterene/hydrochlorothiazide 37.5 mg.  3. Vitamins.  4. Allergy vaccine has continued at 1:10 without problems.  She feels      it helps.  5. Proair rescue inhaler.  6. Occasional Claritin-D.  7. Flonase.  8. Nebulizer machine with albuterol being used a couple of times a day      currently.   ALLERGIES:  SULFA with rash.   OBJECTIVE:  VITAL SIGNS:  Weight 181 pounds, blood pressure 138/82,  pulse 106, room air saturation 97%.  HEENT:  There is a trace of green postnasal drainage.  CHEST:  Quiet without wheeze, but she is a little breathless after  walking the hall.  HEART:  Sounds regular without murmur.   IMPRESSION:  Rhinitis and probable low grade rhinosinusitis,  exacerbation of asthma.   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  Treatment today  with nebulizer Xopenex 1.25 mg and Depo-Medrol 80 mg IM.  Try Singulair  10 mg.  We refilled her Advair 250.  Standby prescription for Z-Pak.  Schedule return in six months, earlier p.r.n.     Clinton D. Maple Hudson, MD, Tonny Bollman, FACP  Electronically Signed   CDY/MedQ  DD: 04/16/2007  DT: 04/16/2007  Job #: [id number]   cc:   Merlene Laughter. Renae Gloss, M.D.

## 2010-10-17 ENCOUNTER — Ambulatory Visit: Payer: Self-pay | Admitting: Internal Medicine

## 2010-11-07 ENCOUNTER — Encounter: Payer: Self-pay | Admitting: Internal Medicine

## 2010-11-09 ENCOUNTER — Ambulatory Visit: Payer: Self-pay | Admitting: Internal Medicine

## 2010-11-18 ENCOUNTER — Encounter: Payer: Self-pay | Admitting: Internal Medicine

## 2010-11-21 ENCOUNTER — Ambulatory Visit (INDEPENDENT_AMBULATORY_CARE_PROVIDER_SITE_OTHER): Payer: 59 | Admitting: Internal Medicine

## 2010-11-21 ENCOUNTER — Encounter: Payer: Self-pay | Admitting: Internal Medicine

## 2010-11-21 VITALS — BP 110/80 | HR 76 | Ht 64.0 in | Wt 160.0 lb

## 2010-11-21 DIAGNOSIS — J45909 Unspecified asthma, uncomplicated: Secondary | ICD-10-CM

## 2010-11-21 DIAGNOSIS — J309 Allergic rhinitis, unspecified: Secondary | ICD-10-CM

## 2010-11-21 NOTE — Progress Notes (Signed)
  Subjective:    Patient ID: Heidi Tran, female    DOB: 10-Jun-1960, 50 y.o.   MRN: [id number]  HPI 11/21/10- 52 yoF followed for asthma, allergic rhinitis, complicated by GERD and Eczema Last here March 16, 2010- note reviewed. Reports an asthma attack before work 4 days ago, blamed on rushing and heat. She used her nebulizer and rescue inhaler. Still feels a little weak, coughing more than usual, nonproductive. Denies having any cold/ infection.  Continues allergy vaccine. Review of Systems Constitutional:   No weight loss, night sweats,  Fevers, chills, fatigue, lassitude. HEENT:   No headaches,  Difficulty swallowing,  Tooth/dental problems,  Sore throat,                No sneezing, itching, ear ache, nasal congestion, post nasal drip,   CV:  No chest pain,  Orthopnea, PND, swelling in lower extremities, anasarca, dizziness, palpitations  GI  No heartburn, indigestion, abdominal pain, nausea, vomiting, diarrhea, change in bowel habits, loss of appetite  Resp: Some shortness of breath with exertion, not at rest.  No excess mucus, no productive cough,  No non-productive cough,  No coughing up of blood.  No change in color of mucus.  No wheezing.   Skin: no rash or lesions.  GU: no dysuria, change in color of urine, no urgency or frequency.  No flank pain.  MS:  No joint pain or swelling.  No decreased range of motion.  No back pain.  Psych:  No change in mood or affect. No depression or anxiety.  No memory loss.      Objective:   Physical Exam General- Alert, Oriented, Affect-appropriate, Distress- none acute  Skin- rash-none, lesions- none, excoriation- none  Lymphadenopathy- none  Head- atraumatic  Eyes- Gross vision intact, PERRLA, conjunctivae clear secretions  Ears- Hearing, canals, Tm- normal  Nose- Clear, No-septal dev, mucus, polyps, erosion, perforation   Throat- Mallampati III , mucosa clear , drainage- none, tonsils- atrophic  Neck- flexible , trachea  midline, no stridor , thyroid nl, carotid no bruit  Chest - symmetrical excursion , unlabored     Heart/CV- RRR , no murmur , no gallop  , no rub, nl s1 s2                     - JVD- none , edema- none, stasis changes- none, varices- none     Lung- clear to P&A, wheeze- none, cough- none , dullness-none, rub- none     Chest wall-   Abd- tender-no, distended-no, bowel sounds-present, HSM- no  Br/ Gen/ Rectal- Not done, not indicated  Extrem- cyanosis- none, clubbing, none, atrophy- none, strength- nl  Neuro- grossly intact to observation         Assessment & Plan:

## 2010-11-21 NOTE — Assessment & Plan Note (Signed)
She managed well with her meds through a short-term flare with no other irritant or infection issues. Discussed options and agreed to observe for now. She will call for med refills.

## 2010-11-26 NOTE — Assessment & Plan Note (Signed)
Adequate control, adding antihistamines seasonally.

## 2010-12-16 ENCOUNTER — Telehealth: Payer: Self-pay | Admitting: Internal Medicine

## 2010-12-16 MED ORDER — PREDNISONE 10 MG PO TABS: ORAL_TABLET | ORAL | Status: DC

## 2010-12-16 NOTE — Telephone Encounter (Signed)
I will ask Florentina Addison to look for f/u opportunity slot.  Meanwhile, suggest we offer prednisone 10 mg # 20, 4 X 2 DAYS, 3 X 2 DAYS, 2 X 2 DAYS, 1 X 2 DAYS.  since that won't bother her stomach, but should help asthma.

## 2010-12-16 NOTE — Telephone Encounter (Signed)
Spoke with patient-aware of appt on 12-20-10 at 9am; pt to be here at 845am to check in.

## 2010-12-16 NOTE — Telephone Encounter (Signed)
Spoke with pt and notified of recs per CDY. She verbalized understanding. Rx was called to pharm. I will forward msg back to Katie per CDY to look for slot to work pt in. I advised that she seek emergency care in the meantime if needed and pt verbalized understanding.

## 2010-12-16 NOTE — Telephone Encounter (Signed)
Called, spoke with pt.  States she started having an asthma attack last Thursday which usually takes her 5-7 days to clear up.  However, pt states she is still having chest tightness, nonprod cough, chest congestion, and stomach is "messed up."  Pt states she usually gets an upset stomach with her asthma attacks which usually resolve but hasn't yet this time - still having n/v/d and week.  States she has some phenergan at home which she will try for this.  SOB is at baseline.  Denies wheezing.  Taking mucinex bid.  She is requesting an OV or something to be sent to [facility].  Allergies verified.  CDY, pls advise.  Thanks!   Allergies  Allergen Reactions  . Sulfonamide Derivatives     REACTION: rash

## 2010-12-20 ENCOUNTER — Encounter: Payer: Self-pay | Admitting: *Deleted

## 2010-12-20 ENCOUNTER — Encounter: Payer: Self-pay | Admitting: Internal Medicine

## 2010-12-20 ENCOUNTER — Ambulatory Visit (INDEPENDENT_AMBULATORY_CARE_PROVIDER_SITE_OTHER): Payer: 59 | Admitting: Internal Medicine

## 2010-12-20 VITALS — BP 140/90 | HR 83 | Ht 64.0 in | Wt 154.6 lb

## 2010-12-20 DIAGNOSIS — J45909 Unspecified asthma, uncomplicated: Secondary | ICD-10-CM

## 2010-12-20 NOTE — Progress Notes (Signed)
Subjective:    Patient ID: Heidi Tran, female    DOB: [birth year], 50 y.o.   MRN: [id number]  HPI    Review of Systems     Objective:   Physical Exam        Assessment & Plan:   Subjective:    Patient ID: Heidi Tran, female    DOB: [birth year], 50 y.o.   MRN: [id number]  HPI 11/21/10- 38 yoF followed for asthma, allergic rhinitis, complicated by GERD and Eczema Last here March 16, 2010- note reviewed. Reports an asthma attack before work 4 days ago, blamed on rushing and heat. She used her nebulizer and rescue inhaler. Still feels a little weak, coughing more than usual, nonproductive. Denies having any cold/ infection.  Continues allergy vaccine.  12/20/10-50 yoF never smoker followed for asthma, allergic rhinitis, complicated by GERD and Eczema Acute visit- Began 5 days ago with wheezing, controlled with meds, but also nauseated/ vomited. Nebulizer sometimes gives some loose stools, but this was different. Finally kept supper down last night. Started prednisone 7/21 and wheeze now is much better.  GI is getting better. Has 3-4 more days on prednisone. Has not had antibiotic or any unfamiliar medicine. Missed most of a week of work- went back yesterday.   Review of Systems Constitutional:   No weight loss, night sweats,  Fevers, chills, fatigue, lassitude. HEENT:   No headaches,  Difficulty swallowing,  Tooth/dental problems,  Sore throat,                No sneezing, itching, ear ache, nasal congestion, post nasal drip,   CV:  No chest pain,  Orthopnea, PND, swelling in lower extremities, anasarca, dizziness, palpitations  GI  No heartburn, indigestion, abdominal pain, , diarrhea, change in bowel habits, loss of appetite  Resp:   No coughing up of blood.  No change in color of mucus.    Skin: no rash or lesions.  GU: no dysuria, change in color of urine, no urgency or frequency.  No flank pain.  MS:  No joint pain or swelling.  No decreased range of motion.  No  back pain.  Psych:  No change in mood or affect. No depression or anxiety.  No memory loss.      Objective:   Physical Exam General- Alert, Oriented, Affect-appropriate, Distress- none acute               Looks tired  Skin- rash-none, lesions- none, excoriation- none  Lymphadenopathy- none  Head- atraumatic  Eyes- Gross vision intact, PERRLA, conjunctivae clear secretions  Ears- Hearing, canals, Tm- normal  Nose- Clear, No-septal dev, mucus, polyps, erosion, perforation   Throat- Mallampati III , mucosa clear , drainage- none, tonsils- atrophic  Neck- flexible , trachea midline, no stridor , thyroid nl, carotid no bruit  Chest - symmetrical excursion , unlabored     Heart/CV- RRR , no murmur , no gallop  , no rub, nl s1 s2                     - JVD- none , edema- none, stasis changes- none, varices- none     Lung- clear to P&A, wheeze- none, cough- none , dullness-none, rub- none     Chest wall-   Abd- tender-no, distended-no, bowel sounds-present, HSM- no  Br/ Gen/ Rectal- Not done, not indicated  Extrem- cyanosis- none, clubbing, none, atrophy- none, strength- nl  Neuro- grossly intact to observation  Assessment & Plan:

## 2010-12-20 NOTE — Patient Instructions (Addendum)
Finish prednisone taper. Rest when you can Stay hydrated Continue routine meds.  Note for LOA

## 2010-12-20 NOTE — Assessment & Plan Note (Signed)
Asthma and an associated gastroenteritis are resolving after what may have been viral. I don't think she vomited first to cause wheeze and i can't tell that meds upset her stomach. She has a few days of prednisone left and will finish taper, stay hydrated, take it easy, working as tolerated.

## 2010-12-23 ENCOUNTER — Encounter: Payer: Self-pay | Admitting: Internal Medicine

## 2010-12-29 ENCOUNTER — Telehealth: Payer: Self-pay | Admitting: Internal Medicine

## 2010-12-29 NOTE — Telephone Encounter (Signed)
Called spoke with patient who c/o asthma flare with increased SOB, wheezing, tightness in chest x1week - denies cough, f/c/s.  Offered rx over the phone or ov w/ CDY as he has openings tomorrow.  appt scheduled with CDY 8.3.12 @ 0900.  Pt okay with this date and time.  And is aware that if her breathing worsens before appt to go to the ER/UC.  Pt did ask about faxing FMLA paperwork to our office.  [employer] fax # given: [phone] attn Bary Leriche per Renee in [employer].  Pt will need to go to [employer] tomorrow after appt for authorization.  Pt verbalized her understanding.

## 2010-12-30 ENCOUNTER — Other Ambulatory Visit (INDEPENDENT_AMBULATORY_CARE_PROVIDER_SITE_OTHER): Payer: 59

## 2010-12-30 ENCOUNTER — Ambulatory Visit (INDEPENDENT_AMBULATORY_CARE_PROVIDER_SITE_OTHER): Payer: 59 | Admitting: Internal Medicine

## 2010-12-30 ENCOUNTER — Encounter: Payer: Self-pay | Admitting: Internal Medicine

## 2010-12-30 ENCOUNTER — Ambulatory Visit (INDEPENDENT_AMBULATORY_CARE_PROVIDER_SITE_OTHER)
Admission: RE | Admit: 2010-12-30 | Discharge: 2010-12-30 | Disposition: A | Discharge status: Home / Self Care | Payer: 59 | Source: Ambulatory Visit | Attending: Internal Medicine | Admitting: Internal Medicine

## 2010-12-30 VITALS — BP 124/74 | HR 101 | Ht 64.0 in | Wt 156.8 lb

## 2010-12-30 DIAGNOSIS — J45909 Unspecified asthma, uncomplicated: Secondary | ICD-10-CM

## 2010-12-30 DIAGNOSIS — J309 Allergic rhinitis, unspecified: Secondary | ICD-10-CM

## 2010-12-30 LAB — BASIC METABOLIC PANEL
Chloride: 101 mEq/L (ref 96–112)
GFR: 105.95 mL/min (ref >60.00)
Potassium: 3.4 mEq/L — ABNORMAL LOW (ref 3.5–5.1)
Sodium: 141 mEq/L (ref 135–145)

## 2010-12-30 LAB — CBC WITH DIFFERENTIAL/PLATELET
Basophils Absolute: 0 10*3/uL (ref 0.0–0.1)
Eosinophils Absolute: 0.1 10*3/uL (ref 0.0–0.7)
Lymphocytes Relative: 22.2 % (ref 12.0–46.0)
MCHC: 33.5 g/dL (ref 30.0–36.0)
MCV: 82.1 fl (ref 78.0–100.0)
Monocytes Absolute: 0.5 10*3/uL (ref 0.1–1.0)
Neutrophils Relative %: 68.9 % (ref 43.0–77.0)
Platelets: 248 10*3/uL (ref 150.0–400.0)
RBC: 4.65 Mil/uL (ref 3.87–5.11)

## 2010-12-30 MED ORDER — PREDNISONE 10 MG PO TABS: 10.0000 mg | ORAL_TABLET | Freq: Every day | ORAL | Status: DC

## 2010-12-30 MED ORDER — FLUTICASONE-SALMETEROL 500-50 MCG/DOSE IN AEPB: 1.0000 | INHALATION_SPRAY | Freq: Two times a day (BID) | RESPIRATORY_TRACT | Status: DC

## 2010-12-30 MED ORDER — METHYLPREDNISOLONE ACETATE 80 MG/ML IJ SUSP
80.0000 mg | Freq: Once | INTRAMUSCULAR | Status: AC
  Administered 2010-12-30: 80 mg via INTRAMUSCULAR

## 2010-12-30 NOTE — Assessment & Plan Note (Signed)
Sustained asthmatic bronchitis, probably with components of stres and reflux. The left anterior chest wall pain is c/w a costochondritis. Plan- Depo 80 then prednisone 10 mg daily, CXR, Allergy profile for IgE>>? xolair candidate? She will be sending FMLA form.  Take pepcid twice daily

## 2010-12-30 NOTE — Patient Instructions (Signed)
Depo 80  Order- CXR    Dx asthma             Lab- CBC w/ diff, BMET, Allergy Profile  Scripts sent for prednisone and Advair  Increase Pepcid to twice every day, before meals

## 2010-12-30 NOTE — Progress Notes (Signed)
Subjective:    Patient ID: Heidi Tran, female    DOB: 11/10/1960, 50 y.o.   MRN: [id number]  HPI    Review of Systems     Objective:   Physical Exam        Assessment & Plan:   Subjective:    Patient ID: Heidi Tran, female    DOB: 09/30/1960, 50 y.o.   MRN: [id number]     Objective:          Assessment & Plan:   Subjective:    Patient ID: Heidi Tran, female    DOB: Oct 09, 1960, 50 y.o.   MRN: [id number]  HPI 11/21/10- 50 yoF followed for asthma, allergic rhinitis, complicated by GERD and Eczema Last here March 16, 2010- note reviewed. Reports an asthma attack before work 4 days ago, blamed on rushing and heat. She used her nebulizer and rescue inhaler. Still feels a little weak, coughing more than usual, nonproductive. Denies having any cold/ infection.  Continues allergy vaccine.  12/20/10-50 yoF never smoker followed for asthma, allergic rhinitis, complicated by GERD and Eczema Acute visit- Began 5 days ago with wheezing, controlled with meds, but also nauseated/ vomited. Nebulizer sometimes gives some loose stools, but this was different. Finally kept supper down last night. Started prednisone 7/21 and wheeze now is much better.  GI is getting better. Has 3-4 more days on prednisone. Has not had antibiotic or any unfamiliar medicine. Missed most of a week of work- went back yesterday.   12/30/10-50 yoF never smoker followed for asthma, allergic rhinitis, complicated by GERD and Eczema After last vist was only able to work 2 days, then out now again for past week. She has been out most of the past month. Yesterday began sharp pain just left of midsternum through to left back. Used nebulizer repeatedly 2 days ago- did overstimulate some. Dry hacky cough with no sneeze or itch. Heart burn acting up- once daily pepcid some help- discussed bid and before meals. Some nasal mucus is green and head feels congested.  Continues allergy vaccine. She is blaming stress at  home and work -just inherited a teenage nephew-, job stress, heat.  Finished latest pred taper- causes nausea.   Review of Systems Constitutional:   No weight loss, night sweats,  Fevers, chills, fatigue, lassitude. HEENT:   No headaches,  Difficulty swallowing,  Tooth/dental problems,  Sore throat,                No sneezing, itching, ear ache, nasal congestion, post nasal drip,   CV:  No chest pain,  Orthopnea, PND, swelling in lower extremities, anasarca, dizziness, palpitations  GI  No heartburn, indigestion, abdominal pain, , diarrhea, change in bowel habits, loss of appetite  Resp:   No coughing up of blood.  No change in color of mucus.    Skin: no rash or lesions.  GU: no dysuria, change in color of urine, no urgency or frequency.  No flank pain.  MS:  No joint pain or swelling.  No decreased range of motion.  No back pain.  Psych:  No change in mood or affect. + depression or anxiety.  No memory loss.      Objective:   Physical Exam General- Alert, Oriented, Affect-appropriate, Distress- none acute   Looks tired Skin- rash-none, lesions- none, excoriation- none Lymphadenopathy- none Head- atraumatic            Eyes- Gross vision intact, PERRLA, conjunctivae clear secretions  Ears- Hearing, canals normal            Nose- Clear, no -Septal dev, mucus, polyps, erosion, perforation             Throat- Mallampati III , mucosa clear , drainage- none, tonsils- atrophic Neck- flexible , trachea midline, no stridor , thyroid nl, carotid no bruit Chest - symmetrical excursion , unlabored           Heart/CV- RRR , no murmur , no gallop  , no rub, nl s1 s2                           - JVD- none , edema- none, stasis changes- none, varices- none           Lung- clear to P&A but sounds are distant., wheeze- none,  dullness-none, rub- none  dry cough           Chest wall-  Abd- tender-no, distended-no, bowel sounds-present, HSM- no Br/ Gen/ Rectal- Not done, not  indicated Extrem- cyanosis- none, clubbing, none, atrophy- none, strength- nl Neuro- grossly intact to observation      Assessment & Plan:

## 2011-01-01 NOTE — Assessment & Plan Note (Signed)
Continues allergy vaccine. We are going to look at Xolair possibility.

## 2011-01-03 LAB — ALLERGY PROFILE REGION II-DC, DE, MD, ~~LOC~~, VA
Allergen, D pternoyssinus,d7: 9.88 kU/L — ABNORMAL HIGH (ref <0.35)
Alternaria Alternata: 0.12 kU/L (ref <0.35)
Bermuda Grass: 0.42 kU/L — ABNORMAL HIGH (ref <0.35)
Box Elder IgE: 0.32 kU/L (ref <0.35)
Cat Dander: 0.39 kU/L — ABNORMAL HIGH (ref <0.35)
Cockroach: 0.12 kU/L (ref <0.35)
D. farinae: 14.3 kU/L — ABNORMAL HIGH (ref <0.35)
Dog Dander: 0.72 kU/L — ABNORMAL HIGH (ref <0.35)
Elm IgE: 1.01 kU/L — ABNORMAL HIGH (ref <0.35)
Oak: 0.21 kU/L (ref <0.35)
Pecan/Hickory Tree IgE: 0.67 kU/L — ABNORMAL HIGH (ref <0.35)

## 2011-01-09 NOTE — Progress Notes (Signed)
Quick Note:  Pt aware of results. ______ 

## 2011-01-16 ENCOUNTER — Ambulatory Visit (INDEPENDENT_AMBULATORY_CARE_PROVIDER_SITE_OTHER): Payer: 59 | Admitting: Internal Medicine

## 2011-01-16 ENCOUNTER — Encounter: Payer: Self-pay | Admitting: *Deleted

## 2011-01-16 ENCOUNTER — Encounter: Payer: Self-pay | Admitting: Internal Medicine

## 2011-01-16 VITALS — BP 130/82 | HR 90 | Ht 64.0 in | Wt 157.6 lb

## 2011-01-16 DIAGNOSIS — J309 Allergic rhinitis, unspecified: Secondary | ICD-10-CM

## 2011-01-16 DIAGNOSIS — J45909 Unspecified asthma, uncomplicated: Secondary | ICD-10-CM

## 2011-01-16 MED ORDER — FAMOTIDINE 20 MG PO TABS: ORAL_TABLET | ORAL | Status: DC

## 2011-01-16 MED ORDER — LORATADINE-PSEUDOEPHEDRINE ER 5-120 MG PO TB12: 1.0000 | ORAL_TABLET | Freq: Two times a day (BID) | ORAL | Status: AC

## 2011-01-16 MED ORDER — ALPRAZOLAM 0.5 MG PO TABS: 0.5000 mg | ORAL_TABLET | Freq: Three times a day (TID) | ORAL | Status: AC | PRN

## 2011-01-16 NOTE — Patient Instructions (Signed)
Order- schedule PFT- dx asthma  Scripts for pepcid and claritin D  Script for Xanax

## 2011-01-16 NOTE — Progress Notes (Signed)
Subjective:    Patient ID: [name], female    DOB: Apr 21, 1961, 50 y.o.   MRN: [id number]  HPI    Review of Systems     Objective:   Physical Exam        Assessment & Plan:   Subjective:    Patient ID: Alaura Schippers, female    DOB: [birth year], 50 y.o.   MRN: [id number]  HPI    Review of Systems     Objective:   Physical Exam        Assessment & Plan:   Subjective:    Patient ID: Adabella Stanis, female    DOB: Apr 12, 1961, 50 y.o.   MRN: [id number]     Objective:          Assessment & Plan:   Subjective:    Patient ID: Janellie Tennison, female    DOB: 02-21-1961, 50 y.o.   MRN: [id number]  HPI 11/21/10- 54 yoF followed for asthma, allergic rhinitis, complicated by GERD and Eczema Last here March 16, 2010- note reviewed. Reports an asthma attack before work 4 days ago, blamed on rushing and heat. She used her nebulizer and rescue inhaler. Still feels a little weak, coughing more than usual, nonproductive. Denies having any cold/ infection.  Continues allergy vaccine.  12/20/10-49 yoF never smoker followed for asthma, allergic rhinitis, complicated by GERD and Eczema Acute visit- Began 5 days ago with wheezing, controlled with meds, but also nauseated/ vomited. Nebulizer sometimes gives some loose stools, but this was different. Finally kept supper down last night. Started prednisone 7/21 and wheeze now is much better.  GI is getting better. Has 3-4 more days on prednisone. Has not had antibiotic or any unfamiliar medicine. Missed most of a week of work- went back yesterday.   12/30/10-49 yoF never smoker followed for asthma, allergic rhinitis, complicated by GERD and Eczema After last vist was only able to work 2 days, then out now again for past week. She has been out most of the past month. Yesterday began sharp pain just left of midsternum through to left back. Used nebulizer repeatedly 2 days ago- did overstimulate some. Dry hacky cough with no sneeze  or itch. Heart burn acting up- once daily pepcid some help- discussed bid and before meals. Some nasal mucus is green and head feels congested.  Continues allergy vaccine. She is blaming stress at home and work -just inherited a teenage nephew-, job stress, heat.  Finished latest pred taper- causes nausea.   01/16/11- 49 yoF never smoker followed for asthma, allergic rhinitis, complicated by GERD and Eczema Throat clearing cough without wheeze. Tries to clear but cough is dry. "Weight of world" on chest sensation, centered just left of sternum as deep ache. Going to get more Pepcid to continue twice daily. Seeking short term disability.  Continues allergy vaccine. Continues to have nephew in her home.    Review of Systems Constitutional:   No weight loss, night sweats,  Fevers, chills, fatigue, lassitude. HEENT:   No headaches,  Difficulty swallowing,  Tooth/dental problems,  Sore throat,                No sneezing, itching, ear ache, nasal congestion, post nasal drip,  CV:  No chest pain,  Orthopnea, PND, swelling in lower extremities, anasarca, dizziness, palpitations GI  No heartburn, indigestion, abdominal pain, , diarrhea, change in bowel habits, loss of appetite Resp:   No coughing up of blood.  No change in color of mucus.   Skin:  no rash or lesions. GU: no dysuria, change in color of urine, no urgency or frequency.  No flank pain. MS:  No joint pain or swelling.  No decreased range of motion.  No back pain. Psych:  No change in mood or affect.    + depression or anxiety- continues.  No memory loss.      Objective:   Physical Exam General- Alert, Oriented, Affect-appropriate, Distress- none acute   Skin- rash-none, lesions- none, excoriation- none Lymphadenopathy- none Head- atraumatic            Eyes- Gross vision intact, PERRLA, conjunctivae clear secretions            Ears- Hearing, canals normal            Nose- Clear, no -Septal dev, mucus, polyps, erosion, perforation              Throat- Mallampati III , mucosa clear , drainage- none, tonsils- atrophic Neck- flexible , trachea midline, no stridor , thyroid nl, carotid no bruit Chest - symmetrical excursion , unlabored           Heart/CV- RRR , no murmur , no gallop  , no rub, nl s1 s2                           - JVD- none , edema- none, stasis changes- none, varices- none           Lung- clear to P&A but sounds are distant., wheeze- none,  dullness-none, rub- none   Minor dry cough           Chest wall-  Abd- tender-no, distended-no, bowel sounds-present, HSM- no Br/ Gen/ Rectal- Not done, not indicated Extrem- cyanosis- none, clubbing, none, atrophy- none, strength- nl Neuro- grossly intact to observation      Assessment & Plan:

## 2011-01-16 NOTE — Assessment & Plan Note (Addendum)
DDX here is asthma, vs tension, vs GERD. Will get PFT.

## 2011-01-20 NOTE — Assessment & Plan Note (Signed)
She continues to feel allergy shots help her rhinitis and improve her asthma. Has not needed antihistamines much.

## 2011-01-25 ENCOUNTER — Ambulatory Visit (INDEPENDENT_AMBULATORY_CARE_PROVIDER_SITE_OTHER): Payer: 59 | Admitting: Internal Medicine

## 2011-01-25 DIAGNOSIS — J45909 Unspecified asthma, uncomplicated: Secondary | ICD-10-CM

## 2011-01-25 LAB — PULMONARY FUNCTION TEST

## 2011-01-25 NOTE — Progress Notes (Signed)
PFT done today. 

## 2011-02-02 ENCOUNTER — Ambulatory Visit (INDEPENDENT_AMBULATORY_CARE_PROVIDER_SITE_OTHER): Payer: 59

## 2011-02-02 DIAGNOSIS — J309 Allergic rhinitis, unspecified: Secondary | ICD-10-CM

## 2011-02-17 ENCOUNTER — Encounter: Payer: Self-pay | Admitting: Internal Medicine

## 2011-02-17 ENCOUNTER — Ambulatory Visit (INDEPENDENT_AMBULATORY_CARE_PROVIDER_SITE_OTHER): Payer: 59 | Admitting: Internal Medicine

## 2011-02-17 VITALS — BP 130/90 | HR 97 | Ht 64.0 in | Wt 165.2 lb

## 2011-02-17 DIAGNOSIS — J309 Allergic rhinitis, unspecified: Secondary | ICD-10-CM

## 2011-02-17 DIAGNOSIS — J45909 Unspecified asthma, uncomplicated: Secondary | ICD-10-CM

## 2011-02-17 DIAGNOSIS — K219 Gastro-esophageal reflux disease without esophagitis: Secondary | ICD-10-CM

## 2011-02-17 MED ORDER — IPRATROPIUM-ALBUTEROL 18-103 MCG/ACT IN AERO: 2.0000 | INHALATION_SPRAY | RESPIRATORY_TRACT | Status: DC | PRN

## 2011-02-17 MED ORDER — FLUTICASONE-SALMETEROL 250-50 MCG/DOSE IN AEPB: 1.0000 | INHALATION_SPRAY | Freq: Two times a day (BID) | RESPIRATORY_TRACT | Status: DC

## 2011-02-17 NOTE — Assessment & Plan Note (Addendum)
?   Still some activity on Pepcid twice a day. Plan- change Pepcid to omeprazole otc. Continue reflux precautions.

## 2011-02-17 NOTE — Assessment & Plan Note (Signed)
Allergy vaccine continues to be well tolerated and she is compliant with the treatment plan. She complains of increased sinus pressure and "congestion", blowing out little. This may be seasonal and might not be related to the fall allergens as much as to weather change. Symptomatic therapy should be sufficient as discussed.

## 2011-02-17 NOTE — Progress Notes (Signed)
Subjective:   HPI Review of Systems    Objective:   Physical Exam    Assessment & Plan:   Subjective:   Objective:   Assessment & Plan:   Subjective:   Objective:    Assessment & Plan:   Subjective:    Objective:   Assessment & Plan:   Subjective:    Patient ID: Heidi Tran, female    DOB: Mar 29, 1961, 50 y.o.   MRN: [id number]  HPI 11/21/10- 15 yoF followed for asthma, allergic rhinitis, complicated by GERD and Eczema Last here March 16, 2010- note reviewed. Reports an asthma attack before work 4 days ago, blamed on rushing and heat. She used her nebulizer and rescue inhaler. Still feels a little weak, coughing more than usual, nonproductive. Denies having any cold/ infection.  Continues allergy vaccine.  12/20/10-49 yoF never smoker followed for asthma, allergic rhinitis, complicated by GERD and Eczema Acute visit- Began 5 days ago with wheezing, controlled with meds, but also nauseated/ vomited. Nebulizer sometimes gives some loose stools, but this was different. Finally kept supper down last night. Started prednisone 7/21 and wheeze now is much better.  GI is getting better. Has 3-4 more days on prednisone. Has not had antibiotic or any unfamiliar medicine. Missed most of a week of work- went back yesterday.   12/30/10-49 yoF never smoker followed for asthma, allergic rhinitis, complicated by GERD and Eczema After last vist was only able to work 2 days, then out now again for past week. She has been out most of the past month. Yesterday began sharp pain just left of midsternum through to left back. Used nebulizer repeatedly 2 days ago- did overstimulate some. Dry hacky cough with no sneeze or itch. Heart burn acting up- once daily pepcid some help- discussed bid and before meals. Some nasal mucus is green and head feels congested.  Continues allergy vaccine. She is blaming stress at home and work -just inherited a teenage nephew-, job stress, heat.  Finished latest  pred taper- causes nausea.   01/16/11- 49 yoF never smoker followed for asthma, allergic rhinitis, complicated by GERD and Eczema Throat clearing cough without wheeze. Tries to clear but cough is dry. "Weight of world" on chest sensation, centered just left of sternum as deep ache. Going to get more Pepcid to continue twice daily. Seeking short term disability.  Continues allergy vaccine. Continues to have nephew in her home.   02/17/11- 49 yoF never smoker followed for asthma, allergic rhinitis, complicated by GERD and Eczema Breathing comfortably at rest now. Wakes from sleep snoring or coughing, with dry throat. Will use rescue inhaler to relieve tight chest. Admits some reflux if forgets Pepcid 2x daily. Applying for short and long term disability simultaneously.  Continues allergy vaccine. Feels more sinus pressure/ congestion which she relates to fall season/ weather.  PFT- WNL 01/25/11- FEV1/FVC 0.78 w/o response to bronchodilator.  Feet may swell a little some times. Left of midsternal soreness at times.  She had taken prednisone 10 mg daily, ran out and quit last week. Continues Advair 500 and uses rescue inhaler at most twice daily. Nebulizer at least once most days.   Review of Systems Constitutional:   No weight loss, night sweats,  Fevers, chills, fatigue, lassitude. HEENT:   No headaches,  Difficulty swallowing,  Tooth/dental problems,  Sore throat,                No sneezing, itching, ear ache, +nasal congestion, post nasal drip,  CV:  No  chest pain, orthopnea, PND, swelling in lower extremities, anasarca, dizziness, palpitations GI  No heartburn, indigestion, abdominal pain, , diarrhea, change in bowel habits, loss of appetite Resp:   No coughing up of blood.  No change in color of mucus.   Skin: no rash or lesions. GU: no dysuria, change in color of urine, no urgency or frequency.  No flank pain. MS:  No joint pain or swelling.  No decreased range of motion.  No back pain. Psych:   No change in mood or affect.    + depression or anxiety- continues.  No memory loss.      Objective:   Physical Exam General- Alert, Oriented, Affect-appropriate, Distress- none acute   Skin- rash-none, lesions- none, excoriation- none Lymphadenopathy- none Head- atraumatic            Eyes- Gross vision intact, PERRLA, conjunctivae clear secretions            Ears- Hearing, canals normal            Nose- Clear, no -Septal dev, mucus, polyps, erosion, perforation             Throat- Mallampati III , mucosa clear , drainage- none, tonsils- atrophic Neck- flexible , trachea midline, no stridor , thyroid nl, carotid no bruit Chest - symmetrical excursion , unlabored           Heart/CV- RRR , no murmur , no gallop  , no rub, nl s1 s2                           - JVD- none , edema- none, stasis changes- none, varices- none           Lung- clear to P&A but sounds are distant., wheeze- none,  dullness-none, rub- none   Minor dry cough again.           Chest wall-  Abd- tender-no, distended-no, bowel sounds-present, HSM- no Br/ Gen/ Rectal- Not done, not indicated Extrem- cyanosis- none, clubbing, none, atrophy- none, strength- nl Neuro- grossly intact to observation      Assessment & Plan:

## 2011-02-17 NOTE — Patient Instructions (Signed)
Flu vax  Change Proair rescue inhaler to Combivent  2 puffs 4x daily as needed- rescue inhaler  Advair- use up your 500, then change to new script for 250/50   Still 2 puffs and rinse mouth, twice daily  We have stopped prednisone  Change Pepcid to Omeprazole otc for stomach acid,  1 tab twice daily before meals

## 2011-02-17 NOTE — Assessment & Plan Note (Signed)
Still not sure whether the acknowledged stress and reflux are the only issues. If there is asthma active, she is pretty heavily medicated.  Plan- Reduce Advair to 250   Stay off prednisone    Try change Proair to Combivent Respimat

## 2011-02-28 ENCOUNTER — Telehealth: Payer: Self-pay | Admitting: Internal Medicine

## 2011-03-01 NOTE — Telephone Encounter (Signed)
Heidi Tran, is there anything this pt needed? Nothing was documented. Please advise, thanks

## 2011-03-01 NOTE — Telephone Encounter (Signed)
I created this in Error.  Heidi Tran

## 2011-03-31 ENCOUNTER — Ambulatory Visit (INDEPENDENT_AMBULATORY_CARE_PROVIDER_SITE_OTHER): Payer: 59 | Admitting: Internal Medicine

## 2011-03-31 ENCOUNTER — Encounter: Payer: Self-pay | Admitting: Internal Medicine

## 2011-03-31 ENCOUNTER — Encounter: Payer: Self-pay | Admitting: Pulmonary Disease

## 2011-03-31 VITALS — BP 130/86 | HR 100 | Ht 64.0 in | Wt 169.4 lb

## 2011-03-31 DIAGNOSIS — K219 Gastro-esophageal reflux disease without esophagitis: Secondary | ICD-10-CM

## 2011-03-31 DIAGNOSIS — J45909 Unspecified asthma, uncomplicated: Secondary | ICD-10-CM

## 2011-03-31 NOTE — Patient Instructions (Signed)
Note- ok to return to work as of April 07, 2011  I am glad you are feeling better

## 2011-03-31 NOTE — Progress Notes (Signed)
Patient ID: Heidi Tran, female    DOB: 28-Apr-1961, 50 y.o.   MRN: [id number]  HPI 11/21/10- 46 yoF followed for asthma, allergic rhinitis, complicated by GERD and Eczema Last here March 16, 2010- note reviewed. Reports an asthma attack before work 4 days ago, blamed on rushing and heat. She used her nebulizer and rescue inhaler. Still feels a little weak, coughing more than usual, nonproductive. Denies having any cold/ infection.  Continues allergy vaccine.  12/20/10-49 yoF never smoker followed for asthma, allergic rhinitis, complicated by GERD and Eczema Acute visit- Began 5 days ago with wheezing, controlled with meds, but also nauseated/ vomited. Nebulizer sometimes gives some loose stools, but this was different. Finally kept supper down last night. Started prednisone 7/21 and wheeze now is much better.  GI is getting better. Has 3-4 more days on prednisone. Has not had antibiotic or any unfamiliar medicine. Missed most of a week of work- went back yesterday.   12/30/10-49 yoF never smoker followed for asthma, allergic rhinitis, complicated by GERD and Eczema After last vist was only able to work 2 days, then out now again for past week. She has been out most of the past month. Yesterday began sharp pain just left of midsternum through to left back. Used nebulizer repeatedly 2 days ago- did overstimulate some. Dry hacky cough with no sneeze or itch. Heart burn acting up- once daily pepcid some help- discussed bid and before meals. Some nasal mucus is green and head feels congested.  Continues allergy vaccine. She is blaming stress at home and work -just inherited a teenage nephew-, job stress, heat.  Finished latest pred taper- causes nausea.   01/16/11- 49 yoF never smoker followed for asthma, allergic rhinitis, complicated by GERD and Eczema Throat clearing cough without wheeze. Tries to clear but cough is dry. "Weight of world" on chest sensation, centered just left of sternum as deep  ache. Going to get more Pepcid to continue twice daily. Seeking short term disability.  Continues allergy vaccine. Continues to have nephew in her home.   02/17/11- 49 yoF never smoker followed for asthma, allergic rhinitis, complicated by GERD and Eczema Breathing comfortably at rest now. Wakes from sleep snoring or coughing, with dry throat. Will use rescue inhaler to relieve tight chest. Admits some reflux if forgets Pepcid 2x daily. Applying for short and long term disability simultaneously.  Continues allergy vaccine. Feels more sinus pressure/ congestion which she relates to fall season/ weather.  PFT- WNL 01/25/11- FEV1/FVC 0.78 w/o response to bronchodilator.  Feet may swell a little some times. Left of midsternal soreness at times.  She had taken prednisone 10 mg daily, ran out and quit last week. Continues Advair 500 and uses rescue inhaler at most twice daily. Nebulizer at least once most days.   03/31/11  50 yoF never smoker followed for asthma, allergic rhinitis, complicated by GERD and Eczema She has been off of prednisone since the last visit and we had changed pro air to Combivent. She continues allergy vaccine. She still feels "tired, exhausted" but clearly is much more comfortable. Easy exertional dyspnea. She is supposed to go back to work next week and I am encouraging her to do so. Pepcid seems adequate for acid control. She is using Advair 250/50 twice daily. She is still dealing at home with a teenage female who is living with her now.  Review of Systems Constitutional:   No weight loss, night sweats,  Fevers, chills,+ fatigue, lassitude. HEENT:   No  headaches,  Difficulty swallowing,  Tooth/dental problems,  Sore throat,                No sneezing, itching, ear ache, +nasal congestion, post nasal drip,  CV:  No chest pain, orthopnea, PND, swelling in lower extremities, anasarca, dizziness, palpitations GI  No heartburn, indigestion, abdominal pain, , diarrhea, change in bowel  habits, loss of appetite Resp:   No coughing up of blood.  No change in color of mucus.   Skin: no  GU: no dysuria, change in color of urine, no urgency or frequency.  No flank pain. MS:  No joint pain or swelling.  No decreased range of motion.  No back pain. Psych:  No change in mood or affect.    + depression or anxiety- continues.  No memory loss.      Objective:   Physical Exam General- Alert, Oriented, Affect-appropriate, Distress- none acute. Affect seems much happier today.  Skin- rash-none, lesions- none, excoriation- none Lymphadenopathy- none Head- atraumatic            Eyes- Gross vision intact, PERRLA, conjunctivae clear secretions            Ears- Hearing, canals normal            Nose- Clear, no -Septal dev, mucus, polyps, erosion, perforation             Throat- Mallampati III , mucosa clear , drainage- none, tonsils- atrophic Neck- flexible , trachea midline, no stridor , thyroid nl, carotid no bruit Chest - symmetrical excursion , unlabored           Heart/CV- RRR , no murmur , no gallop  , no rub, nl s1 s2                           - JVD- none , edema- none, stasis changes- none, varices- none           Lung- clear to P&A ., wheeze- none,  dullness-none, rub- none              Chest wall-  Abd- tender-no, distended-no, bowel sounds-present, HSM- no Br/ Gen/ Rectal- Not done, not indicated Extrem- cyanosis- none, clubbing, none, atrophy- none, strength- nl Neuro- grossly intact to observation

## 2011-04-01 NOTE — Assessment & Plan Note (Signed)
Controlled and comfortable. She is satisfied that her medications are working. I think she has also begun to feel she can call with her stresses and that she is ready to return to work.

## 2011-04-01 NOTE — Assessment & Plan Note (Signed)
GERD is adequately controlled by Pepcid. She is following reflux precautions.

## 2011-05-25 ENCOUNTER — Ambulatory Visit: Payer: 59 | Admitting: Internal Medicine

## 2011-05-31 ENCOUNTER — Ambulatory Visit (INDEPENDENT_AMBULATORY_CARE_PROVIDER_SITE_OTHER): Payer: 59 | Admitting: Internal Medicine

## 2011-05-31 ENCOUNTER — Encounter: Payer: Self-pay | Admitting: Internal Medicine

## 2011-05-31 ENCOUNTER — Encounter: Payer: Self-pay | Admitting: *Deleted

## 2011-05-31 VITALS — BP 122/76 | HR 94 | Ht 64.0 in | Wt 177.8 lb

## 2011-05-31 DIAGNOSIS — J45909 Unspecified asthma, uncomplicated: Secondary | ICD-10-CM

## 2011-05-31 DIAGNOSIS — J309 Allergic rhinitis, unspecified: Secondary | ICD-10-CM

## 2011-05-31 NOTE — Patient Instructions (Signed)
Work note- I follow for allergic rhinitis and asthma which have been recently worsened by a flu-like illness. I recommend she work only 4 days/ week until next office visit in 3 months.   Please call as needed

## 2011-05-31 NOTE — Assessment & Plan Note (Signed)
Control continues adequate. This is not a season when she expects significant allergy symptoms.

## 2011-05-31 NOTE — Progress Notes (Signed)
Patient ID: Lenna Hagarty, female    DOB: [birth year], 51 y.o.   MRN: [id number]  HPI 11/21/10- 51 yoF followed for asthma, allergic rhinitis, complicated by GERD and Eczema Last here March 16, 2010- note reviewed. Reports an asthma attack before work 4 days ago, blamed on rushing and heat. She used her nebulizer and rescue inhaler. Still feels a little weak, coughing more than usual, nonproductive. Denies having any cold/ infection.  Continues allergy vaccine.  12/20/10-51 yoF never smoker followed for asthma, allergic rhinitis, complicated by GERD and Eczema Acute visit- Began 5 days ago with wheezing, controlled with meds, but also nauseated/ vomited. Nebulizer sometimes gives some loose stools, but this was different. Finally kept supper down last night. Started prednisone 7/21 and wheeze now is much better.  GI is getting better. Has 3-4 more days on prednisone. Has not had antibiotic or any unfamiliar medicine. Missed most of a week of work- went back yesterday.   12/30/10-51 yoF never smoker followed for asthma, allergic rhinitis, complicated by GERD and Eczema After last vist was only able to work 2 days, then out now again for past week. She has been out most of the past month. Yesterday began sharp pain just left of midsternum through to left back. Used nebulizer repeatedly 2 days ago- did overstimulate some. Dry hacky cough with no sneeze or itch. Heart burn acting up- once daily pepcid some help- discussed bid and before meals. Some nasal mucus is green and head feels congested.  Continues allergy vaccine. She is blaming stress at home and work -just inherited a teenage nephew-, job stress, heat.  Finished latest pred taper- causes nausea.   01/16/11- 51 yoF never smoker followed for asthma, allergic rhinitis, complicated by GERD and Eczema Throat clearing cough without wheeze. Tries to clear but cough is dry. "Weight of world" on chest sensation, centered just left of sternum as deep  ache. Going to get more Pepcid to continue twice daily. Seeking short term disability.  Continues allergy vaccine. Continues to have nephew in her home.   02/17/11- 51 yoF never smoker followed for asthma, allergic rhinitis, complicated by GERD and Eczema Breathing comfortably at rest now. Wakes from sleep snoring or coughing, with dry throat. Will use rescue inhaler to relieve tight chest. Admits some reflux if forgets Pepcid 2x daily. Applying for short and long term disability simultaneously.  Continues allergy vaccine. Feels more sinus pressure/ congestion which she relates to fall season/ weather.  PFT- WNL 01/25/11- FEV1/FVC 0.78 w/o response to bronchodilator.  Feet may swell a little some times. Left of midsternal soreness at times.  She had taken prednisone 10 mg daily, ran out and quit last week. Continues Advair 500 and uses rescue inhaler at most twice daily. Nebulizer at least once most days.   03/31/11  51 yoF never smoker followed for asthma, allergic rhinitis, complicated by GERD and Eczema She has been off of prednisone since the last visit and we had changed pro air to Combivent. She continues allergy vaccine. She still feels "tired, exhausted" but clearly is much more comfortable. Easy exertional dyspnea. She is supposed to go back to work next week and I am encouraging her to do so. Pepcid seems adequate for acid control. She is using Advair 250/50 twice daily. She is still dealing at home with a teenage female who is living with her now.  05/31/11- 51 yoF never smoker followed for asthma, allergic rhinitis, complicated by GERD,  Eczema, stress She was able to  go back to work November 19. In the past week she has had discomfort she thought felt like a viral illness including myalgias and chilling without recognized fever or sore throat. She has been coughing more, described as a dry hacking cough. Wheezes some at night when she feels tight while lying down. Nasal congestion. Complains of  persistent weakness which has dragged on since the November illness and made worse by the current symptoms. She had discussed it with her employers and is asking me now to restrict her to a four-day work week for a while "till I can get my strength back". She has been treating herself with Aleve and throat lozenges.  Review of Systems Constitutional:   No weight loss, night sweats,  Fevers, chills,+ fatigue, lassitude. HEENT:   No headaches,  Difficulty swallowing,  Tooth/dental problems,  Sore throat,                No sneezing, itching, ear ache, +nasal congestion, post nasal drip,  CV:  No chest pain, orthopnea, PND, swelling in lower extremities, anasarca, dizziness, palpitations GI  No heartburn, indigestion, abdominal pain, , diarrhea, change in bowel habits, loss of appetite Resp:   No coughing up of blood.  No change in color of mucus.   Skin: no  GU:  MS:  No joint pain or swelling.  No decreased range of motion.  No back pain. Psych:  No change in mood or affect.    + depression or anxiety- continues.  No memory loss.      Objective:   Physical Exam General- Alert, Oriented, Affect-appropriate, Distress- none acute.   Skin- rash-none, lesions- none, excoriation- none Lymphadenopathy- none Head- atraumatic            Eyes- Gross vision intact, PERRLA, conjunctivae clear secretions            Ears- Hearing, canals normal            Nose- Repeated sniffing, no -Septal dev, mucus, polyps, erosion, perforation             Throat- Mallampati III , mucosa clear , drainage- none, tonsils- atrophic Neck- flexible , trachea midline, no stridor , thyroid nl, carotid no bruit Chest - symmetrical excursion , unlabored           Heart/CV- RRR , no murmur , no gallop  , no rub, nl s1 s2                           - JVD- none , edema- none, stasis changes- none, varices- none           Lung- clear to P&A ., wheeze- none,  dullness-none, rub- none              Chest wall-  Abd- Br/ Gen/  Rectal- Not done, not indicated Extrem- cyanosis- none, clubbing, none, atrophy- none, strength- nl Neuro- grossly intact to observation

## 2011-05-31 NOTE — Assessment & Plan Note (Signed)
Probable recent viral illness partly blunted by her previous flu shot. Persistent sense of weakness and fatigue likely comes from this but I think it is made worse by ongoing stress related to having to share her home with the young female relative she took in.

## 2011-07-03 ENCOUNTER — Other Ambulatory Visit: Payer: Self-pay | Admitting: Internal Medicine

## 2011-08-02 ENCOUNTER — Ambulatory Visit (INDEPENDENT_AMBULATORY_CARE_PROVIDER_SITE_OTHER): Payer: 59

## 2011-08-02 DIAGNOSIS — J309 Allergic rhinitis, unspecified: Secondary | ICD-10-CM

## 2011-08-23 ENCOUNTER — Other Ambulatory Visit: Payer: Self-pay | Admitting: Internal Medicine

## 2011-08-23 ENCOUNTER — Ambulatory Visit
Admission: RE | Admit: 2011-08-23 | Discharge: 2011-08-23 | Disposition: A | Discharge status: Home / Self Care | Payer: 59 | Source: Ambulatory Visit | Attending: Internal Medicine | Admitting: Internal Medicine

## 2011-08-23 DIAGNOSIS — M79643 Pain in unspecified hand: Secondary | ICD-10-CM

## 2011-08-23 DIAGNOSIS — M25539 Pain in unspecified wrist: Secondary | ICD-10-CM

## 2011-08-29 ENCOUNTER — Ambulatory Visit: Payer: 59 | Admitting: Internal Medicine

## 2011-10-06 ENCOUNTER — Encounter: Payer: Self-pay | Admitting: Internal Medicine

## 2011-10-06 ENCOUNTER — Ambulatory Visit (INDEPENDENT_AMBULATORY_CARE_PROVIDER_SITE_OTHER): Payer: 59 | Admitting: Internal Medicine

## 2011-10-06 VITALS — BP 130/86 | HR 95 | Ht 64.0 in | Wt 174.2 lb

## 2011-10-06 DIAGNOSIS — J45909 Unspecified asthma, uncomplicated: Secondary | ICD-10-CM

## 2011-10-06 DIAGNOSIS — J309 Allergic rhinitis, unspecified: Secondary | ICD-10-CM

## 2011-10-06 NOTE — Patient Instructions (Signed)
Please call as needed  Ok to resume your regular work schedule

## 2011-10-06 NOTE — Progress Notes (Signed)
Patient ID: Lenna Hagarty, female    DOB: [birth year], 51 y.o.   MRN: [id number]  HPI 11/21/10- 32 yoF followed for asthma, allergic rhinitis, complicated by GERD and Eczema Last here March 16, 2010- note reviewed. Reports an asthma attack before work 4 days ago, blamed on rushing and heat. She used her nebulizer and rescue inhaler. Still feels a little weak, coughing more than usual, nonproductive. Denies having any cold/ infection.  Continues allergy vaccine.  12/20/10-49 yoF never smoker followed for asthma, allergic rhinitis, complicated by GERD and Eczema Acute visit- Began 5 days ago with wheezing, controlled with meds, but also nauseated/ vomited. Nebulizer sometimes gives some loose stools, but this was different. Finally kept supper down last night. Started prednisone 7/21 and wheeze now is much better.  GI is getting better. Has 3-4 more days on prednisone. Has not had antibiotic or any unfamiliar medicine. Missed most of a week of work- went back yesterday.   12/30/10-49 yoF never smoker followed for asthma, allergic rhinitis, complicated by GERD and Eczema After last vist was only able to work 2 days, then out now again for past week. She has been out most of the past month. Yesterday began sharp pain just left of midsternum through to left back. Used nebulizer repeatedly 2 days ago- did overstimulate some. Dry hacky cough with no sneeze or itch. Heart burn acting up- once daily pepcid some help- discussed bid and before meals. Some nasal mucus is green and head feels congested.  Continues allergy vaccine. She is blaming stress at home and work -just inherited a teenage nephew-, job stress, heat.  Finished latest pred taper- causes nausea.   01/16/11- 49 yoF never smoker followed for asthma, allergic rhinitis, complicated by GERD and Eczema Throat clearing cough without wheeze. Tries to clear but cough is dry. "Weight of world" on chest sensation, centered just left of sternum as deep  ache. Going to get more Pepcid to continue twice daily. Seeking short term disability.  Continues allergy vaccine. Continues to have nephew in her home.   02/17/11- 49 yoF never smoker followed for asthma, allergic rhinitis, complicated by GERD and Eczema Breathing comfortably at rest now. Wakes from sleep snoring or coughing, with dry throat. Will use rescue inhaler to relieve tight chest. Admits some reflux if forgets Pepcid 2x daily. Applying for short and long term disability simultaneously.  Continues allergy vaccine. Feels more sinus pressure/ congestion which she relates to fall season/ weather.  PFT- WNL 01/25/11- FEV1/FVC 0.78 w/o response to bronchodilator.  Feet may swell a little some times. Left of midsternal soreness at times.  She had taken prednisone 10 mg daily, ran out and quit last week. Continues Advair 500 and uses rescue inhaler at most twice daily. Nebulizer at least once most days.   03/31/11  50 yoF never smoker followed for asthma, allergic rhinitis, complicated by GERD and Eczema She has been off of prednisone since the last visit and we had changed pro air to Combivent. She continues allergy vaccine. She still feels "tired, exhausted" but clearly is much more comfortable. Easy exertional dyspnea. She is supposed to go back to work next week and I am encouraging her to do so. Pepcid seems adequate for acid control. She is using Advair 250/50 twice daily. She is still dealing at home with a teenage female who is living with her now.  05/31/11- 50 yoF never smoker followed for asthma, allergic rhinitis, complicated by GERD,  Eczema, stress She was able to  go back to work November 19. In the past week she has had discomfort she thought felt like a viral illness including myalgias and chilling without recognized fever or sore throat. She has been coughing more, described as a dry hacking cough. Wheezes some at night when she feels tight while lying down. Nasal congestion. Complains of  persistent weakness which has dragged on since the November illness and made worse by the current symptoms. She had discussed it with her employers and is asking me now to restrict her to a four-day work week for a while "till I can get my strength back". She has been treating herself with Aleve and throat lozenges.  10/06/11- 50 yoF never smoker followed for asthma, allergic rhinitis, complicated by GERD,  Eczema, stress She is now back at work 4 days a week. Still sometimes aware of shortness of breath. PFTs were normal in 2012.  Review of Systems-see HPI Constitutional:   No weight loss, night sweats,  Fevers, chills,+ fatigue, lassitude. HEENT:   No headaches,  Difficulty swallowing,  Tooth/dental problems,  Sore throat,                No sneezing, itching, ear ache, +nasal congestion, post nasal drip,  CV:  No chest pain, orthopnea, PND, swelling in lower extremities, anasarca, dizziness, palpitations GI  No heartburn, indigestion, abdominal pain, , Resp:   No coughing up of blood.  No change in color of mucus.   Skin: no  GU:  MS:  No joint pain or swelling. Marland Kitchen Psych:  No change in mood or affect.    + depression or anxiety- continues.  No memory loss.  Objective:   Physical Exam General- Alert, Oriented, Affect-appropriate, Distress- none acute.   Skin- rash-none, lesions- none, excoriation- none Lymphadenopathy- none Head- atraumatic            Eyes- Gross vision intact, PERRLA, conjunctivae clear secretions            Ears- Hearing, canals normal            Nose- Repeated sniffing, no -Septal dev, mucus, polyps, erosion, perforation             Throat- Mallampati III , mucosa clear , drainage- none, tonsils- atrophic Neck- flexible , trachea midline, no stridor , thyroid nl, carotid no bruit Chest - symmetrical excursion , unlabored           Heart/CV- RRR , no murmur , no gallop  , no rub, nl s1 s2                           - JVD- none , edema- none, stasis changes- none,  varices- none           Lung- very clear to P&A ., wheeze- none,  dullness-none, rub- none              Chest wall-  Abd- Br/ Gen/ Rectal- Not done, not indicated Extrem- cyanosis- none, clubbing, none, atrophy- none, strength- nl Neuro- grossly intact to observation

## 2011-10-13 NOTE — Assessment & Plan Note (Addendum)
The component of stress and anxiety has improved, making it easier for her to understand her own feelings and limitations. Asthma is bothering her less with no acute exacerbations. I think she can return to full-time work now. Educated her on the new Combivent Respimat device.

## 2011-10-13 NOTE — Assessment & Plan Note (Signed)
Well controlled and doing better than usual for this time of year for her.

## 2011-10-27 ENCOUNTER — Other Ambulatory Visit: Payer: Self-pay | Admitting: Internal Medicine

## 2011-10-27 DIAGNOSIS — Z1231 Encounter for screening mammogram for malignant neoplasm of breast: Secondary | ICD-10-CM

## 2011-11-24 ENCOUNTER — Ambulatory Visit: Payer: 59

## 2011-12-22 ENCOUNTER — Ambulatory Visit
Admission: RE | Admit: 2011-12-22 | Discharge: 2011-12-22 | Disposition: A | Discharge status: Home / Self Care | Payer: 59 | Source: Ambulatory Visit | Attending: Internal Medicine | Admitting: Internal Medicine

## 2011-12-22 DIAGNOSIS — Z1231 Encounter for screening mammogram for malignant neoplasm of breast: Secondary | ICD-10-CM

## 2012-01-24 ENCOUNTER — Ambulatory Visit (INDEPENDENT_AMBULATORY_CARE_PROVIDER_SITE_OTHER): Payer: 59

## 2012-01-24 DIAGNOSIS — J309 Allergic rhinitis, unspecified: Secondary | ICD-10-CM

## 2012-02-06 ENCOUNTER — Ambulatory Visit: Payer: 59 | Admitting: Internal Medicine

## 2012-02-07 ENCOUNTER — Other Ambulatory Visit: Payer: Self-pay | Admitting: Internal Medicine

## 2012-02-08 ENCOUNTER — Encounter: Payer: Self-pay | Admitting: Internal Medicine

## 2012-02-08 ENCOUNTER — Ambulatory Visit (INDEPENDENT_AMBULATORY_CARE_PROVIDER_SITE_OTHER): Payer: 59 | Admitting: Internal Medicine

## 2012-02-08 VITALS — BP 138/82 | HR 75 | Ht 64.0 in | Wt 169.0 lb

## 2012-02-08 DIAGNOSIS — J398 Other specified diseases of upper respiratory tract: Secondary | ICD-10-CM

## 2012-02-08 DIAGNOSIS — J988 Other specified respiratory disorders: Secondary | ICD-10-CM

## 2012-02-08 DIAGNOSIS — J45998 Other asthma: Secondary | ICD-10-CM

## 2012-02-08 DIAGNOSIS — J309 Allergic rhinitis, unspecified: Secondary | ICD-10-CM

## 2012-02-08 MED ORDER — EPINEPHRINE 0.3 MG/0.3ML IJ DEVI: 0.3000 mg | Freq: Once | INTRAMUSCULAR | Status: DC

## 2012-02-08 MED ORDER — FLUTICASONE-SALMETEROL 250-50 MCG/DOSE IN AEPB: 1.0000 | INHALATION_SPRAY | Freq: Two times a day (BID) | RESPIRATORY_TRACT | Status: DC

## 2012-02-08 NOTE — Patient Instructions (Addendum)
Scripts sent to refill Advair and Epipen  Please call as needed

## 2012-02-08 NOTE — Progress Notes (Signed)
Patient ID: Heidi Tran, female    DOB: [birth year], 51 y.o.   MRN: [id number]  HPI 11/21/10- 32 yoF followed for asthma, allergic rhinitis, complicated by GERD and Eczema Last here March 16, 2010- note reviewed. Reports an asthma attack before work 4 days ago, blamed on rushing and heat. She used her nebulizer and rescue inhaler. Still feels a little weak, coughing more than usual, nonproductive. Denies having any cold/ infection.  Continues allergy vaccine.  12/20/10-49 yoF never smoker followed for asthma, allergic rhinitis, complicated by GERD and Eczema Acute visit- Began 5 days ago with wheezing, controlled with meds, but also nauseated/ vomited. Nebulizer sometimes gives some loose stools, but this was different. Finally kept supper down last night. Started prednisone 7/21 and wheeze now is much better.  GI is getting better. Has 3-4 more days on prednisone. Has not had antibiotic or any unfamiliar medicine. Missed most of a week of work- went back yesterday.   12/30/10-49 yoF never smoker followed for asthma, allergic rhinitis, complicated by GERD and Eczema After last vist was only able to work 2 days, then out now again for past week. She has been out most of the past month. Yesterday began sharp pain just left of midsternum through to left back. Used nebulizer repeatedly 2 days ago- did overstimulate some. Dry hacky cough with no sneeze or itch. Heart burn acting up- once daily pepcid some help- discussed bid and before meals. Some nasal mucus is green and head feels congested.  Continues allergy vaccine. She is blaming stress at home and work -just inherited a teenage nephew-, job stress, heat.  Finished latest pred taper- causes nausea.   01/16/11- 49 yoF never smoker followed for asthma, allergic rhinitis, complicated by GERD and Eczema Throat clearing cough without wheeze. Tries to clear but cough is dry. "Weight of world" on chest sensation, centered just left of sternum as deep  ache. Going to get more Pepcid to continue twice daily. Seeking short term disability.  Continues allergy vaccine. Continues to have nephew in her home.   02/17/11- 49 yoF never smoker followed for asthma, allergic rhinitis, complicated by GERD and Eczema Breathing comfortably at rest now. Wakes from sleep snoring or coughing, with dry throat. Will use rescue inhaler to relieve tight chest. Admits some reflux if forgets Pepcid 2x daily. Applying for short and long term disability simultaneously.  Continues allergy vaccine. Feels more sinus pressure/ congestion which she relates to fall season/ weather.  PFT- WNL 01/25/11- FEV1/FVC 0.78 w/o response to bronchodilator.  Feet may swell a little some times. Left of midsternal soreness at times.  She had taken prednisone 10 mg daily, ran out and quit last week. Continues Advair 500 and uses rescue inhaler at most twice daily. Nebulizer at least once most days.   03/31/11  50 yoF never smoker followed for asthma, allergic rhinitis, complicated by GERD and Eczema She has been off of prednisone since the last visit and we had changed pro air to Combivent. She continues allergy vaccine. She still feels "tired, exhausted" but clearly is much more comfortable. Easy exertional dyspnea. She is supposed to go back to work next week and I am encouraging her to do so. Pepcid seems adequate for acid control. She is using Advair 250/50 twice daily. She is still dealing at home with a teenage female who is living with her now.  05/31/11- 50 yoF never smoker followed for asthma, allergic rhinitis, complicated by GERD,  Eczema, stress She was able to  go back to work November 19. In the past week she has had discomfort she thought felt like a viral illness including myalgias and chilling without recognized fever or sore throat. She has been coughing more, described as a dry hacking cough. Wheezes some at night when she feels tight while lying down. Nasal congestion. Complains of  persistent weakness which has dragged on since the November illness and made worse by the current symptoms. She had discussed it with her employers and is asking me now to restrict her to a four-day work week for a while "till I can get my strength back". She has been treating herself with Aleve and throat lozenges.  10/06/11- 50 yoF never smoker followed for asthma, allergic rhinitis, complicated by GERD,  Eczema, stress She is now back at work 4 days a week. Still sometimes aware of shortness of breath. PFTs were normal in 2012.  02/08/12-  51 yoF never smoker followed for asthma, allergic rhinitis, complicated by GERD,  Eczema, stress Slight SOB this morning-? due to weather; otherwise doing well; on vaccine and no flare ups. Continues allergy vaccine 1:10 GO. Gets her flu shot at work. Rare need for rescue inhaler. She is back to working full time now.  Review of Systems-see HPI Constitutional:   No weight loss, night sweats,  Fevers, chills,+ fatigue, lassitude. HEENT:   No headaches,  Difficulty swallowing,  Tooth/dental problems,  Sore throat,                No sneezing, itching, ear ache, +nasal congestion, post nasal drip,  CV:  No chest pain, orthopnea, PND, swelling in lower extremities, anasarca, dizziness, palpitations GI  No heartburn, indigestion, abdominal pain, , Resp:   No coughing up of blood.  No change in color of mucus.  + Mild wheeze today Skin: no  GU:  MS:  No joint pain or swelling. Marland Kitchen Psych:  No change in mood or affect.    + depression or anxiety- continues.  No memory loss.  Objective:   Physical Exam General- Alert, Oriented, Affect-appropriate, Distress- none acute.   Skin- rash-none, lesions- none, excoriation- none Lymphadenopathy- none Head- atraumatic            Eyes- Gross vision intact, PERRLA, conjunctivae clear secretions            Ears- Hearing, canals normal            Nose- Repeated sniffing, no -Septal dev, mucus, polyps, erosion, perforation              Throat- Mallampati III , mucosa clear , drainage- none, tonsils- atrophic Neck- flexible , trachea midline, no stridor , thyroid nl, carotid no bruit Chest - symmetrical excursion , unlabored           Heart/CV- RRR , no murmur , no gallop  , no rub, nl s1 s2                           - JVD- none , edema- none, stasis changes- none, varices- none           Lung-  clear to P&A ., wheeze- none,  dullness-none, rub- none              Chest wall-  Abd- Br/ Gen/ Rectal- Not done, not indicated Extrem- cyanosis- none, clubbing, none, atrophy- none, strength- nl Neuro- grossly intact to observation

## 2012-02-16 NOTE — Assessment & Plan Note (Signed)
She feels she is doing well with allergy vaccine. Plan-refill EpiPen

## 2012-02-16 NOTE — Assessment & Plan Note (Signed)
Slight exacerbation earlier today blamed on humid weather but clear now. Plan-refill Advair

## 2012-06-18 ENCOUNTER — Telehealth: Payer: Self-pay | Admitting: Internal Medicine

## 2012-06-18 MED ORDER — PREDNISONE 10 MG PO TABS: ORAL_TABLET | ORAL | Status: DC

## 2012-06-18 MED ORDER — IPRATROPIUM-ALBUTEROL 20-100 MCG/ACT IN AERS: 1.0000 | INHALATION_SPRAY | Freq: Four times a day (QID) | RESPIRATORY_TRACT | Status: DC | PRN

## 2012-06-18 NOTE — Telephone Encounter (Signed)
Pt returned call.  Advised prescriptions were sent to the pharmacy.  Pt verbalized understanding & stated nothing further needed at this time. Heidi Tran

## 2012-06-18 NOTE — Telephone Encounter (Signed)
Per Cy 1. Prednisone 8 day taper. 40mg x2 then 30 mg x2 , 20mg  x2 , 10 mg x 2.#20 2. rx combivent resipimat <>1 puff q 4 hours prn ><refill prn  Explain change from old combivent to the resipimat

## 2012-06-18 NOTE — Telephone Encounter (Signed)
I spoke with pt and she stated she had an asthma attack last week and is requesting pred taper as she is still having some sxs. C/o still SOB off and on, wheezing, chest. Last OV 02/08/12 pending OV 08/07/12 Also will need combivent refill. She does not have new combivent respimat so please advise directions for this as well thanks  Allergies  Allergen Reactions  . Sulfonamide Derivatives     REACTION: rash

## 2012-06-18 NOTE — Telephone Encounter (Signed)
LMTCB x 1. Prescriptions sent to the pharmacy.

## 2012-08-07 ENCOUNTER — Ambulatory Visit (INDEPENDENT_AMBULATORY_CARE_PROVIDER_SITE_OTHER): Payer: 59 | Admitting: Internal Medicine

## 2012-08-07 ENCOUNTER — Encounter: Payer: Self-pay | Admitting: Internal Medicine

## 2012-08-07 ENCOUNTER — Ambulatory Visit: Payer: 59 | Admitting: Internal Medicine

## 2012-08-07 VITALS — BP 120/98 | HR 103 | Ht 64.0 in | Wt 165.0 lb

## 2012-08-07 DIAGNOSIS — J309 Allergic rhinitis, unspecified: Secondary | ICD-10-CM

## 2012-08-07 DIAGNOSIS — J302 Other seasonal allergic rhinitis: Secondary | ICD-10-CM

## 2012-08-07 DIAGNOSIS — J45998 Other asthma: Secondary | ICD-10-CM

## 2012-08-07 DIAGNOSIS — J45909 Unspecified asthma, uncomplicated: Secondary | ICD-10-CM

## 2012-08-07 MED ORDER — AZELASTINE-FLUTICASONE 137-50 MCG/ACT NA SUSP: 2.0000 | Freq: Every day | NASAL | Status: DC

## 2012-08-07 NOTE — Patient Instructions (Addendum)
Sample Dymista nasal spray     1-2 puffs each nostril once daily at bedtime     Try this instead of Flonase for now  You can try different otc antihistamines- Claritin/ loratadine, Allegra/ fexofenadine, Zyrtec/ cetirizine, or clortrimeton  If you keep having allergy problems this spring, we will schedule allergy skin testing  Please call as needed

## 2012-08-07 NOTE — Progress Notes (Signed)
Patient ID: Heidi Tran, female    DOB: [birth year], 52 y.o.   MRN: [id number]  HPI 11/21/10- 32 yoF followed for asthma, allergic rhinitis, complicated by GERD and Eczema Last here March 16, 2010- note reviewed. Reports an asthma attack before work 4 days ago, blamed on rushing and heat. She used her nebulizer and rescue inhaler. Still feels a little weak, coughing more than usual, nonproductive. Denies having any cold/ infection.  Continues allergy vaccine.  12/20/10-49 yoF never smoker followed for asthma, allergic rhinitis, complicated by GERD and Eczema Acute visit- Began 5 days ago with wheezing, controlled with meds, but also nauseated/ vomited. Nebulizer sometimes gives some loose stools, but this was different. Finally kept supper down last night. Started prednisone 7/21 and wheeze now is much better.  GI is getting better. Has 3-4 more days on prednisone. Has not had antibiotic or any unfamiliar medicine. Missed most of a week of work- went back yesterday.   12/30/10-49 yoF never smoker followed for asthma, allergic rhinitis, complicated by GERD and Eczema After last vist was only able to work 2 days, then out now again for past week. She has been out most of the past month. Yesterday began sharp pain just left of midsternum through to left back. Used nebulizer repeatedly 2 days ago- did overstimulate some. Dry hacky cough with no sneeze or itch. Heart burn acting up- once daily pepcid some help- discussed bid and before meals. Some nasal mucus is green and head feels congested.  Continues allergy vaccine. She is blaming stress at home and work -just inherited a teenage nephew-, job stress, heat.  Finished latest pred taper- causes nausea.   01/16/11- 49 yoF never smoker followed for asthma, allergic rhinitis, complicated by GERD and Eczema Throat clearing cough without wheeze. Tries to clear but cough is dry. "Weight of world" on chest sensation, centered just left of sternum as deep  ache. Going to get more Pepcid to continue twice daily. Seeking short term disability.  Continues allergy vaccine. Continues to have nephew in her home.   02/17/11- 49 yoF never smoker followed for asthma, allergic rhinitis, complicated by GERD and Eczema Breathing comfortably at rest now. Wakes from sleep snoring or coughing, with dry throat. Will use rescue inhaler to relieve tight chest. Admits some reflux if forgets Pepcid 2x daily. Applying for short and long term disability simultaneously.  Continues allergy vaccine. Feels more sinus pressure/ congestion which she relates to fall season/ weather.  PFT- WNL 01/25/11- FEV1/FVC 0.78 w/o response to bronchodilator.  Feet may swell a little some times. Left of midsternal soreness at times.  She had taken prednisone 10 mg daily, ran out and quit last week. Continues Advair 500 and uses rescue inhaler at most twice daily. Nebulizer at least once most days.   03/31/11  50 yoF never smoker followed for asthma, allergic rhinitis, complicated by GERD and Eczema She has been off of prednisone since the last visit and we had changed pro air to Combivent. She continues allergy vaccine. She still feels "tired, exhausted" but clearly is much more comfortable. Easy exertional dyspnea. She is supposed to go back to work next week and I am encouraging her to do so. Pepcid seems adequate for acid control. She is using Advair 250/50 twice daily. She is still dealing at home with a teenage female who is living with her now.  05/31/11- 50 yoF never smoker followed for asthma, allergic rhinitis, complicated by GERD,  Eczema, stress She was able to  go back to work November 19. In the past week she has had discomfort she thought felt like a viral illness including myalgias and chilling without recognized fever or sore throat. She has been coughing more, described as a dry hacking cough. Wheezes some at night when she feels tight while lying down. Nasal congestion. Complains of  persistent weakness which has dragged on since the November illness and made worse by the current symptoms. She had discussed it with her employers and is asking me now to restrict her to a four-day work week for a while "till I can get my strength back". She has been treating herself with Aleve and throat lozenges.  10/06/11- 50 yoF never smoker followed for asthma, allergic rhinitis, complicated by GERD,  Eczema, stress She is now back at work 4 days a week. Still sometimes aware of shortness of breath. PFTs were normal in 2012.  02/08/12-  51 yoF never smoker followed for asthma, allergic rhinitis, complicated by GERD,  Eczema, stress Slight SOB this morning-? due to weather; otherwise doing well; on vaccine and no flare ups. Continues allergy vaccine 1:10 GO. Gets her flu shot at work. Rare need for rescue inhaler. She is back to working full time now.  08/06/12-  51 yoF never smoker followed for asthma, allergic rhinitis, complicated by GERD,  Eczema, stress Follows for : pt states having some sob,watery eyes,chest congestion, using rescue inhaler alot more. still on allergy vaccine 1:10 GO with no flare ups.  In the last 3 weeks has had watery eyes, some chest tightness and shortness of breath without wheeze. Using Flonase and Claritin. She had taken a prednisone taper during the winter.  Review of Systems-see HPI Constitutional:   No weight loss, night sweats,  Fevers, chills,+ fatigue, lassitude. HEENT:   No headaches,  Difficulty swallowing,  Tooth/dental problems,  Sore throat,                + sneezing, itching, ear ache, +nasal congestion, post nasal drip,  CV:  No chest pain, orthopnea, PND, swelling in lower extremities, anasarca, dizziness, palpitations GI  No heartburn, indigestion, abdominal pain, , Resp:   No coughing up of blood.  No change in color of mucus.  No-wheeze today Skin: no  GU:  MS:  No joint pain or swelling. Marland Kitchen Psych:  No change in mood or affect.    + depression  or anxiety- continues.  No memory loss.  Objective:   Physical Exam General- Alert, Oriented, Affect-appropriate, Distress- none acute.   Skin- rash-none, lesions- none, excoriation- none Lymphadenopathy- none Head- atraumatic            Eyes- Gross vision intact, PERRLA, conjunctivae clear secretions            Ears- Hearing, canals normal            Nose- Repeated sniffing, no -Septal dev, +mucus bridging, no-polyps, erosion, perforation             Throat- Mallampati III , mucosa clear , drainage- none, tonsils- atrophic Neck- flexible , trachea midline, no stridor , thyroid nl, carotid no bruit Chest - symmetrical excursion , unlabored           Heart/CV- RRR , no murmur , no gallop  , no rub, nl s1 s2                           - JVD- none , edema- none, stasis  changes- none, varices- none           Lung-  clear to P&A ., wheeze- none,  dullness-none, rub- none              Chest wall-  Abd- Br/ Gen/ Rectal- Not done, not indicated Extrem- cyanosis- none, clubbing, none, atrophy- none, strength- nl Neuro- grossly intact to observation

## 2012-08-08 ENCOUNTER — Ambulatory Visit (INDEPENDENT_AMBULATORY_CARE_PROVIDER_SITE_OTHER): Payer: 59

## 2012-08-10 NOTE — Assessment & Plan Note (Signed)
She can continue allergy vaccine.try adding sample Dymista nasal spray.

## 2012-08-10 NOTE — Assessment & Plan Note (Signed)
Acute chest tightness and shortness of breath indicate exacerbation although she is not yet hearing wheeze. We would like to delay prednisone if possible and we'll see if controlling upper airway symptoms makes a difference.

## 2012-08-27 ENCOUNTER — Ambulatory Visit: Payer: 59 | Admitting: Internal Medicine

## 2012-09-04 ENCOUNTER — Telehealth: Payer: Self-pay | Admitting: Internal Medicine

## 2012-09-04 NOTE — Telephone Encounter (Signed)
lmomtcb x1 for pt 

## 2012-09-05 MED ORDER — AZELASTINE-FLUTICASONE 137-50 MCG/ACT NA SUSP: 2.0000 | Freq: Every day | NASAL | Status: DC

## 2012-09-05 NOTE — Telephone Encounter (Signed)
Spoke with pt requesting rx be sent for dymista. rx sent nothing further needed.

## 2012-09-18 ENCOUNTER — Ambulatory Visit (INDEPENDENT_AMBULATORY_CARE_PROVIDER_SITE_OTHER): Payer: 59 | Admitting: Internal Medicine

## 2012-09-18 ENCOUNTER — Encounter: Payer: Self-pay | Admitting: Internal Medicine

## 2012-09-18 VITALS — BP 118/74 | HR 78 | Ht 64.0 in | Wt 170.4 lb

## 2012-09-18 DIAGNOSIS — J45909 Unspecified asthma, uncomplicated: Secondary | ICD-10-CM

## 2012-09-18 DIAGNOSIS — J302 Other seasonal allergic rhinitis: Secondary | ICD-10-CM

## 2012-09-18 DIAGNOSIS — J309 Allergic rhinitis, unspecified: Secondary | ICD-10-CM

## 2012-09-18 DIAGNOSIS — J45998 Other asthma: Secondary | ICD-10-CM

## 2012-09-18 NOTE — Patient Instructions (Addendum)
We can continue allergy vaccine 1:10 GO  Please call as needed 

## 2012-09-18 NOTE — Progress Notes (Signed)
Patient ID: Heidi Tran, female    DOB: [birth year], 52 y.o.   MRN: [id number]  HPI 11/21/10- 32 yoF followed for asthma, allergic rhinitis, complicated by GERD and Eczema Last here March 16, 2010- note reviewed. Reports an asthma attack before work 4 days ago, blamed on rushing and heat. She used her nebulizer and rescue inhaler. Still feels a little weak, coughing more than usual, nonproductive. Denies having any cold/ infection.  Continues allergy vaccine.  12/20/10-49 yoF never smoker followed for asthma, allergic rhinitis, complicated by GERD and Eczema Acute visit- Began 5 days ago with wheezing, controlled with meds, but also nauseated/ vomited. Nebulizer sometimes gives some loose stools, but this was different. Finally kept supper down last night. Started prednisone 7/21 and wheeze now is much better.  GI is getting better. Has 3-4 more days on prednisone. Has not had antibiotic or any unfamiliar medicine. Missed most of a week of work- went back yesterday.   12/30/10-49 yoF never smoker followed for asthma, allergic rhinitis, complicated by GERD and Eczema After last vist was only able to work 2 days, then out now again for past week. She has been out most of the past month. Yesterday began sharp pain just left of midsternum through to left back. Used nebulizer repeatedly 2 days ago- did overstimulate some. Dry hacky cough with no sneeze or itch. Heart burn acting up- once daily pepcid some help- discussed bid and before meals. Some nasal mucus is green and head feels congested.  Continues allergy vaccine. She is blaming stress at home and work -just inherited a teenage nephew-, job stress, heat.  Finished latest pred taper- causes nausea.   01/16/11- 49 yoF never smoker followed for asthma, allergic rhinitis, complicated by GERD and Eczema Throat clearing cough without wheeze. Tries to clear but cough is dry. "Weight of world" on chest sensation, centered just left of sternum as deep  ache. Going to get more Pepcid to continue twice daily. Seeking short term disability.  Continues allergy vaccine. Continues to have nephew in her home.   02/17/11- 49 yoF never smoker followed for asthma, allergic rhinitis, complicated by GERD and Eczema Breathing comfortably at rest now. Wakes from sleep snoring or coughing, with dry throat. Will use rescue inhaler to relieve tight chest. Admits some reflux if forgets Pepcid 2x daily. Applying for short and long term disability simultaneously.  Continues allergy vaccine. Feels more sinus pressure/ congestion which she relates to fall season/ weather.  PFT- WNL 01/25/11- FEV1/FVC 0.78 w/o response to bronchodilator.  Feet may swell a little some times. Left of midsternal soreness at times.  She had taken prednisone 10 mg daily, ran out and quit last week. Continues Advair 500 and uses rescue inhaler at most twice daily. Nebulizer at least once most days.   03/31/11  50 yoF never smoker followed for asthma, allergic rhinitis, complicated by GERD and Eczema She has been off of prednisone since the last visit and we had changed pro air to Combivent. She continues allergy vaccine. She still feels "tired, exhausted" but clearly is much more comfortable. Easy exertional dyspnea. She is supposed to go back to work next week and I am encouraging her to do so. Pepcid seems adequate for acid control. She is using Advair 250/50 twice daily. She is still dealing at home with a teenage female who is living with her now.  05/31/11- 50 yoF never smoker followed for asthma, allergic rhinitis, complicated by GERD,  Eczema, stress She was able to  go back to work November 19. In the past week she has had discomfort she thought felt like a viral illness including myalgias and chilling without recognized fever or sore throat. She has been coughing more, described as a dry hacking cough. Wheezes some at night when she feels tight while lying down. Nasal congestion. Complains of  persistent weakness which has dragged on since the November illness and made worse by the current symptoms. She had discussed it with her employers and is asking me now to restrict her to a four-day work week for a while "till I can get my strength back". She has been treating herself with Aleve and throat lozenges.  10/06/11- 50 yoF never smoker followed for asthma, allergic rhinitis, complicated by GERD,  Eczema, stress She is now back at work 4 days a week. Still sometimes aware of shortness of breath. PFTs were normal in 2012.  02/08/12-  51 yoF never smoker followed for asthma, allergic rhinitis, complicated by GERD,  Eczema, stress Slight SOB this morning-? due to weather; otherwise doing well; on vaccine and no flare ups. Continues allergy vaccine 1:10 GO. Gets her flu shot at work. Rare need for rescue inhaler. She is back to working full time now.  08/06/12-  51 yoF never smoker followed for asthma, allergic rhinitis, complicated by GERD,  Eczema, stress Follows for : pt states having some sob,watery eyes,chest congestion, using rescue inhaler alot more. still on allergy vaccine 1:10 GO with no flare ups.  In the last 3 weeks has had watery eyes, some chest tightness and shortness of breath without wheeze. Using Flonase and Claritin. She had taken a prednisone taper during the winter.  09/18/12-  33 yoF never smoker followed for asthma, allergic rhinitis, complicated by GERD,  Eczema, stress FOLLOWS FOR: still on allergy vaccine 1:10 GO and doing well; has slight chest tightness from increased pollen outside. Likes Dymista. No longer having eczema.  Review of Systems-see HPI Constitutional:   No weight loss, night sweats,  Fevers, chills,+ fatigue, lassitude. HEENT:   No headaches,  Difficulty swallowing,  Tooth/dental problems,  Sore throat,                No- sneezing, itching, ear ache, +nasal congestion, post nasal drip,  CV:  No chest pain, orthopnea, PND, swelling in lower  extremities, anasarca, dizziness, palpitations GI  No heartburn, indigestion, abdominal pain, , Resp:   No coughing up of blood.  No change in color of mucus.  No-wheeze today Skin: no  GU:  MS:  No joint pain or swelling. Marland Kitchen Psych:  No change in mood or affect.    + depression or anxiety- continues.  No memory loss.  Objective:   Physical Exam General- Alert, Oriented, Affect-appropriate, Distress- none acute.   Skin- rash-none, lesions- none, excoriation- none Lymphadenopathy- none Head- atraumatic            Eyes- Gross vision intact, PERRLA, conjunctivae clear secretions            Ears- Hearing, canals normal            Nose- Repeated sniffing, no -Septal dev, +mucus bridging, no-polyps, erosion, perforation             Throat- Mallampati III , mucosa clear , drainage- none, tonsils- atrophic Neck- flexible , trachea midline, no stridor , thyroid nl, carotid no bruit Chest - symmetrical excursion , unlabored           Heart/CV- RRR , no murmur ,  no gallop  , no rub, nl s1 s2                           - JVD- none , edema- none, stasis changes- none, varices- none           Lung-  clear to P&A ., wheeze- none,  dullness-none, rub- none              Chest wall-  Abd- Br/ Gen/ Rectal- Not done, not indicated Extrem- cyanosis- none, clubbing, none, atrophy- none, strength- nl Neuro- grossly intact to observation

## 2012-09-25 NOTE — Assessment & Plan Note (Signed)
Adequate control in peak pollen season. She understands to use antihistamine if needed. We discussed goals and risks of allergy vaccine she is satisfied to continue.

## 2012-09-25 NOTE — Assessment & Plan Note (Signed)
Minor chest tightness at times without active wheezing. Medications seem to be doing well.

## 2012-10-25 ENCOUNTER — Other Ambulatory Visit: Payer: Self-pay | Admitting: Nurse Practitioner

## 2012-10-25 DIAGNOSIS — M545 Low back pain: Secondary | ICD-10-CM

## 2012-11-02 ENCOUNTER — Other Ambulatory Visit: Payer: 59

## 2012-11-04 ENCOUNTER — Other Ambulatory Visit: Payer: Self-pay | Admitting: Internal Medicine

## 2013-03-03 ENCOUNTER — Ambulatory Visit (INDEPENDENT_AMBULATORY_CARE_PROVIDER_SITE_OTHER)
Admission: RE | Admit: 2013-03-03 | Discharge: 2013-03-03 | Disposition: A | Discharge status: Home / Self Care | Payer: 59 | Source: Ambulatory Visit | Attending: Internal Medicine | Admitting: Internal Medicine

## 2013-03-03 ENCOUNTER — Ambulatory Visit (INDEPENDENT_AMBULATORY_CARE_PROVIDER_SITE_OTHER): Payer: 59 | Admitting: Internal Medicine

## 2013-03-03 ENCOUNTER — Encounter: Payer: Self-pay | Admitting: Internal Medicine

## 2013-03-03 VITALS — BP 138/82 | HR 94 | Ht 64.0 in | Wt 173.0 lb

## 2013-03-03 DIAGNOSIS — J45909 Unspecified asthma, uncomplicated: Secondary | ICD-10-CM

## 2013-03-03 DIAGNOSIS — R0789 Other chest pain: Secondary | ICD-10-CM

## 2013-03-03 DIAGNOSIS — J45998 Other asthma: Secondary | ICD-10-CM

## 2013-03-03 NOTE — Progress Notes (Signed)
Patient ID: Heidi Tran, female    DOB: [birth year], 52 y.o.   MRN: [id number]  HPI 11/21/10- 32 yoF followed for asthma, allergic rhinitis, complicated by GERD and Eczema Last here March 16, 2010- note reviewed. Reports an asthma attack before work 4 days ago, blamed on rushing and heat. She used her nebulizer and rescue inhaler. Still feels a little weak, coughing more than usual, nonproductive. Denies having any cold/ infection.  Continues allergy vaccine.  12/20/10-49 yoF never smoker followed for asthma, allergic rhinitis, complicated by GERD and Eczema Acute visit- Began 5 days ago with wheezing, controlled with meds, but also nauseated/ vomited. Nebulizer sometimes gives some loose stools, but this was different. Finally kept supper down last night. Started prednisone 7/21 and wheeze now is much better.  GI is getting better. Has 3-4 more days on prednisone. Has not had antibiotic or any unfamiliar medicine. Missed most of a week of work- went back yesterday.   12/30/10-49 yoF never smoker followed for asthma, allergic rhinitis, complicated by GERD and Eczema After last vist was only able to work 2 days, then out now again for past week. She has been out most of the past month. Yesterday began sharp pain just left of midsternum through to left back. Used nebulizer repeatedly 2 days ago- did overstimulate some. Dry hacky cough with no sneeze or itch. Heart burn acting up- once daily pepcid some help- discussed bid and before meals. Some nasal mucus is green and head feels congested.  Continues allergy vaccine. She is blaming stress at home and work -just inherited a teenage nephew-, job stress, heat.  Finished latest pred taper- causes nausea.   01/16/11- 49 yoF never smoker followed for asthma, allergic rhinitis, complicated by GERD and Eczema Throat clearing cough without wheeze. Tries to clear but cough is dry. "Weight of world" on chest sensation, centered just left of sternum as deep  ache. Going to get more Pepcid to continue twice daily. Seeking short term disability.  Continues allergy vaccine. Continues to have nephew in her home.   02/17/11- 49 yoF never smoker followed for asthma, allergic rhinitis, complicated by GERD and Eczema Breathing comfortably at rest now. Wakes from sleep snoring or coughing, with dry throat. Will use rescue inhaler to relieve tight chest. Admits some reflux if forgets Pepcid 2x daily. Applying for short and long term disability simultaneously.  Continues allergy vaccine. Feels more sinus pressure/ congestion which she relates to fall season/ weather.  PFT- WNL 01/25/11- FEV1/FVC 0.78 w/o response to bronchodilator.  Feet may swell a little some times. Left of midsternal soreness at times.  She had taken prednisone 10 mg daily, ran out and quit last week. Continues Advair 500 and uses rescue inhaler at most twice daily. Nebulizer at least once most days.   03/31/11  50 yoF never smoker followed for asthma, allergic rhinitis, complicated by GERD and Eczema She has been off of prednisone since the last visit and we had changed pro air to Combivent. She continues allergy vaccine. She still feels "tired, exhausted" but clearly is much more comfortable. Easy exertional dyspnea. She is supposed to go back to work next week and I am encouraging her to do so. Pepcid seems adequate for acid control. She is using Advair 250/50 twice daily. She is still dealing at home with a teenage female who is living with her now.  05/31/11- 50 yoF never smoker followed for asthma, allergic rhinitis, complicated by GERD,  Eczema, stress She was able to  go back to work November 19. In the past week she has had discomfort she thought felt like a viral illness including myalgias and chilling without recognized fever or sore throat. She has been coughing more, described as a dry hacking cough. Wheezes some at night when she feels tight while lying down. Nasal congestion. Complains of  persistent weakness which has dragged on since the November illness and made worse by the current symptoms. She had discussed it with her employers and is asking me now to restrict her to a four-day work week for a while "till I can get my strength back". She has been treating herself with Aleve and throat lozenges.  10/06/11- 50 yoF never smoker followed for asthma, allergic rhinitis, complicated by GERD,  Eczema, stress She is now back at work 4 days a week. Still sometimes aware of shortness of breath. PFTs were normal in 2012.  02/08/12-  51 yoF never smoker followed for asthma, allergic rhinitis, complicated by GERD,  Eczema, stress Slight SOB this morning-? due to weather; otherwise doing well; on vaccine and no flare ups. Continues allergy vaccine 1:10 GO. Gets her flu shot at work. Rare need for rescue inhaler. She is back to working full time now.  08/06/12-  51 yoF never smoker followed for asthma, allergic rhinitis, complicated by GERD,  Eczema, stress Follows for : pt states having some sob,watery eyes,chest congestion, using rescue inhaler alot more. still on allergy vaccine 1:10 GO with no flare ups.  In the last 3 weeks has had watery eyes, some chest tightness and shortness of breath without wheeze. Using Flonase and Claritin. She had taken a prednisone taper during the winter.  09/18/12-  8 yoF never smoker followed for asthma, allergic rhinitis, complicated by GERD,  Eczema, stress FOLLOWS FOR: still on allergy vaccine 1:10 GO and doing well; has slight chest tightness from increased pollen outside. Likes Dymista. No longer having eczema.  03/03/13- 52 yoF never smoker followed for asthma, allergic rhinitis, complicated by GERD,  Eczema, stress ACUTE VISIT:  SOB, tightness in chest and wheezing x3 weeks and fatigued Gets flu vaccine at work PCP gave Rocephin, prednisone taper 4 weeks ago without help. Feels weak, dry cough and wheeze but never fever or green. Had sharp pain left  anterior upper chest through to scapula, pleuritic, resolved. Denies leg pain or swelling. Allergy vaccine 1:10 GO- doing well, continuing to give her own.  Review of Systems-see HPI Constitutional:   No weight loss, night sweats,  Fevers, chills,+ fatigue, lassitude. HEENT:   No headaches,  Difficulty swallowing,  Tooth/dental problems,  Sore throat,                No- sneezing, itching, ear ache, +nasal congestion, post nasal drip,  CV:  No chest pain, orthopnea, PND, swelling in lower extremities, anasarca, dizziness, palpitations GI  No heartburn, indigestion, abdominal pain, , Resp:   No coughing up of blood.  No change in color of mucus.  +wheeze  Skin: no  GU:  MS:  No joint pain or swelling. Marland Kitchen Psych:  No change in mood or affect.    + depression or anxiety- continues.  No memory loss.  Objective:   Physical Exam General- Alert, Oriented, Affect-appropriate, Distress- none acute.   Skin- rash-none, lesions- none, excoriation- none Lymphadenopathy- none Head- atraumatic            Eyes- Gross vision intact, PERRLA, conjunctivae clear secretions  Ears- Hearing, canals normal            Nose- Repeated sniffing, no -Septal dev, +mucus bridging, no-polyps, erosion, perforation             Throat- Mallampati III , mucosa clear , drainage- none, tonsils- atrophic Neck- flexible , trachea midline, no stridor , thyroid nl, carotid no bruit Chest - symmetrical excursion , unlabored           Heart/CV- RRR , no murmur , no gallop  , no rub, nl s1 s2                           - JVD- none , edema- none, stasis changes- none, varices- none           Lung-  clear to P&A ., wheeze- none,  dullness-none, rub- none              Chest wall-  Abd- Br/ Gen/ Rectal- Not done, not indicated Extrem- cyanosis- none, clubbing, none, atrophy- none, strength- nl Neuro- grossly intact to observation

## 2013-03-03 NOTE — Patient Instructions (Signed)
Order- CXR  Dx left chest pain, atypical  Take the pepcid twice daily before meals. If needed, take a liquid antacid in between.

## 2013-03-04 ENCOUNTER — Ambulatory Visit (INDEPENDENT_AMBULATORY_CARE_PROVIDER_SITE_OTHER): Payer: 59

## 2013-03-04 DIAGNOSIS — J309 Allergic rhinitis, unspecified: Secondary | ICD-10-CM

## 2013-03-05 ENCOUNTER — Telehealth: Payer: Self-pay | Admitting: Internal Medicine

## 2013-03-05 ENCOUNTER — Encounter: Payer: Self-pay | Admitting: Internal Medicine

## 2013-03-05 NOTE — Telephone Encounter (Signed)
Done

## 2013-03-05 NOTE — Telephone Encounter (Signed)
I spoke with pt. She stated she needs a work note stating she has been out sick. She reports she has been out for 3 weeks d/t her asthma. She stated she turned in [facility] ad thought this would cover her but they are needing an additional letter. She needs this faxed [phone] attn: [email or web address]. Please advise Dr. Maple Hudson thanks  --aware he will be back until tomorrow.

## 2013-03-06 ENCOUNTER — Telehealth: Payer: Self-pay | Admitting: Internal Medicine

## 2013-03-06 NOTE — Telephone Encounter (Signed)
PT notified of xray results per Dr Maple Hudson

## 2013-03-06 NOTE — Telephone Encounter (Signed)
Florentina Addison, will you please have CD[employer] sign this letter and fax it to below.  Thank you.

## 2013-03-06 NOTE — Telephone Encounter (Signed)
Letter has been faxed to number on file. Thanks.

## 2013-03-12 ENCOUNTER — Other Ambulatory Visit: Payer: Self-pay | Admitting: Internal Medicine

## 2013-03-12 NOTE — Assessment & Plan Note (Addendum)
Question acute exacerbation of asthma. DDX pleurisy or referred esophagitis pain Plan-chest x-ray

## 2013-03-18 ENCOUNTER — Ambulatory Visit: Payer: 59 | Admitting: Internal Medicine

## 2013-06-26 ENCOUNTER — Other Ambulatory Visit: Payer: Self-pay | Admitting: Internal Medicine

## 2013-07-10 ENCOUNTER — Other Ambulatory Visit: Payer: Self-pay | Admitting: Internal Medicine

## 2013-08-20 ENCOUNTER — Ambulatory Visit (INDEPENDENT_AMBULATORY_CARE_PROVIDER_SITE_OTHER): Payer: 59

## 2013-08-20 DIAGNOSIS — J309 Allergic rhinitis, unspecified: Secondary | ICD-10-CM

## 2013-09-01 ENCOUNTER — Encounter: Payer: Self-pay | Admitting: Internal Medicine

## 2013-09-01 ENCOUNTER — Ambulatory Visit (INDEPENDENT_AMBULATORY_CARE_PROVIDER_SITE_OTHER): Payer: 59 | Admitting: Internal Medicine

## 2013-09-01 VITALS — BP 150/102 | HR 90 | Ht 64.0 in | Wt 172.2 lb

## 2013-09-01 DIAGNOSIS — J45909 Unspecified asthma, uncomplicated: Secondary | ICD-10-CM

## 2013-09-01 DIAGNOSIS — J3089 Other allergic rhinitis: Principal | ICD-10-CM

## 2013-09-01 DIAGNOSIS — J302 Other seasonal allergic rhinitis: Secondary | ICD-10-CM

## 2013-09-01 DIAGNOSIS — J309 Allergic rhinitis, unspecified: Secondary | ICD-10-CM

## 2013-09-01 DIAGNOSIS — J45998 Other asthma: Secondary | ICD-10-CM

## 2013-09-01 NOTE — Patient Instructions (Addendum)
We can continue allergy vaccine 1:10 GO  Suggest you work with plain zyrtec, adding a decongestant like sudafed or sudafed-PE only when needed. That way puts less squeeze on your blood pressure than taking Zyrtec-D all the time does.   Please call as needed

## 2013-09-01 NOTE — Progress Notes (Signed)
Patient ID: Heidi Tran, female    DOB: [birth year], 53 y.o.   MRN: [id number]  HPI 11/21/10- 53 yoF followed for asthma, allergic rhinitis, complicated by GERD and Eczema Last here March 16, 2010- note reviewed. Reports an asthma attack before work 4 days ago, blamed on rushing and heat. She used her nebulizer and rescue inhaler. Still feels a little weak, coughing more than usual, nonproductive. Denies having any cold/ infection.  Continues allergy vaccine.  12/20/10-53 yoF never smoker followed for asthma, allergic rhinitis, complicated by GERD and Eczema Acute visit- Began 5 days ago with wheezing, controlled with meds, but also nauseated/ vomited. Nebulizer sometimes gives some loose stools, but this was different. Finally kept supper down last night. Started prednisone 7/21 and wheeze now is much better.  GI is getting better. Has 3-4 more days on prednisone. Has not had antibiotic or any unfamiliar medicine. Missed most of a week of work- went back yesterday.   12/30/10-53 yoF never smoker followed for asthma, allergic rhinitis, complicated by GERD and Eczema After last vist was only able to work 2 days, then out now again for past week. She has been out most of the past month. Yesterday began sharp pain just left of midsternum through to left back. Used nebulizer repeatedly 2 days ago- did overstimulate some. Dry hacky cough with no sneeze or itch. Heart burn acting up- once daily pepcid some help- discussed bid and before meals. Some nasal mucus is green and head feels congested.  Continues allergy vaccine. She is blaming stress at home and work -just inherited a teenage nephew-, job stress, heat.  Finished latest pred taper- causes nausea.   01/16/11- 53 yoF never smoker followed for asthma, allergic rhinitis, complicated by GERD and Eczema Throat clearing cough without wheeze. Tries to clear but cough is dry. "Weight of world" on chest sensation, centered just left of sternum as deep  ache. Going to get more Pepcid to continue twice daily. Seeking short term disability.  Continues allergy vaccine. Continues to have nephew in her home.   02/17/11- 53 yoF never smoker followed for asthma, allergic rhinitis, complicated by GERD and Eczema Breathing comfortably at rest now. Wakes from sleep snoring or coughing, with dry throat. Will use rescue inhaler to relieve tight chest. Admits some reflux if forgets Pepcid 2x daily. Applying for short and long term disability simultaneously.  Continues allergy vaccine. Feels more sinus pressure/ congestion which she relates to fall season/ weather.  PFT- WNL 01/25/11- FEV1/FVC 0.78 w/o response to bronchodilator.  Feet may swell a little some times. Left of midsternal soreness at times.  She had taken prednisone 10 mg daily, ran out and quit last week. Continues Advair 500 and uses rescue inhaler at most twice daily. Nebulizer at least once most days.   03/31/11  53 yoF never smoker followed for asthma, allergic rhinitis, complicated by GERD and Eczema She has been off of prednisone since the last visit and we had changed pro air to Combivent. She continues allergy vaccine. She still feels "tired, exhausted" but clearly is much more comfortable. Easy exertional dyspnea. She is supposed to go back to work next week and I am encouraging her to do so. Pepcid seems adequate for acid control. She is using Advair 250/50 twice daily. She is still dealing at home with a teenage female who is living with her now.  05/31/11- 53 yoF never smoker followed for asthma, allergic rhinitis, complicated by GERD,  Eczema, stress She was able to  go back to work November 19. In the past week she has had discomfort she thought felt like a viral illness including myalgias and chilling without recognized fever or sore throat. She has been coughing more, described as a dry hacking cough. Wheezes some at night when she feels tight while lying down. Nasal congestion. Complains of  persistent weakness which has dragged on since the November illness and made worse by the current symptoms. She had discussed it with her employers and is asking me now to restrict her to a four-day work week for a while "till I can get my strength back". She has been treating herself with Aleve and throat lozenges.  10/06/11- 53 yoF never smoker followed for asthma, allergic rhinitis, complicated by GERD,  Eczema, stress She is now back at work 4 days a week. Still sometimes aware of shortness of breath. PFTs were normal in 2012.  02/08/12-  53 yoF never smoker followed for asthma, allergic rhinitis, complicated by GERD,  Eczema, stress Slight SOB this morning-? due to weather; otherwise doing well; on vaccine and no flare ups. Continues allergy vaccine 1:10 GO. Gets her flu shot at work. Rare need for rescue inhaler. She is back to working full time now.  08/06/12-  53 yoF never smoker followed for asthma, allergic rhinitis, complicated by GERD,  Eczema, stress Follows for : pt states having some sob,watery eyes,chest congestion, using rescue inhaler alot more. still on allergy vaccine 1:10 GO with no flare ups.  In the last 3 weeks has had watery eyes, some chest tightness and shortness of breath without wheeze. Using Flonase and Claritin. She had taken a prednisone taper during the winter.  09/18/12-  53 yoF never smoker followed for asthma, allergic rhinitis, complicated by GERD,  Eczema, stress FOLLOWS FOR: still on allergy vaccine 1:10 GO and doing well; has slight chest tightness from increased pollen outside. Likes Dymista. No longer having eczema.  03/03/13- 53 yoF never smoker followed for asthma, allergic rhinitis, complicated by GERD,  Eczema, stress ACUTE VISIT:  SOB, tightness in chest and wheezing x3 weeks and fatigued Gets flu vaccine at work PCP gave Rocephin, prednisone taper 4 weeks ago without help. Feels weak, dry cough and wheeze but never fever or green. Had sharp pain left  anterior upper chest through to scapula, pleuritic, resolved. Denies leg pain or swelling. Allergy vaccine 1:10 GO- doing well, continuing to give her own.  09/01/13- 53 yoF never smoker followed for asthma, allergic rhinitis, complicated by GERD,  Eczema, stress FOLLOWS FOR:  Increased sob since pollen season began over past two weeks.  Believes Zyrtec is causing elevated bp Allergy vaccine 1:10 GO- doing well, continuing to give her own. Increased nasal congestion this spring. Using Zyrtec D but it may raise her blood pressure. We discussed decongestants.  Review of Systems-see HPI Constitutional:   No weight loss, night sweats,  Fevers, chills,+ fatigue, lassitude. HEENT:   No headaches,  Difficulty swallowing,  Tooth/dental problems,  Sore throat,                No- sneezing, itching, ear ache, +nasal congestion, post nasal drip,  CV:  No chest pain, orthopnea, PND, swelling in lower extremities, anasarca, dizziness, palpitations GI  No heartburn, indigestion, abdominal pain, , Resp:   No coughing up of blood.  No change in color of mucus. No-wheeze  Skin: no rash GU:  MS:  No joint pain or swelling. Marland Kitchen Psych:  No change in mood or affect.    +  depression or anxiety- continues.  No memory loss.  Objective:   Physical Exam General- Alert, Oriented, Affect-appropriate, Distress- none acute.   Skin- rash-none, lesions- none, excoriation- none Lymphadenopathy- none Head- atraumatic            Eyes- Gross vision intact, PERRLA, conjunctivae clear secretions            Ears- Hearing, canals normal            Nose- Repeated sniffing, no -Septal dev, +mucus bridging, no-polyps, erosion, perforation             Throat- Mallampati III , mucosa clear , drainage- none, tonsils- atrophic Neck- flexible , trachea midline, no stridor , thyroid nl, carotid no bruit Chest - symmetrical excursion , unlabored           Heart/CV- RRR , no murmur , no gallop  , no rub, nl s1 s2                            - JVD- none , edema- none, stasis changes- none, varices- none           Lung-  clear to P&A , wheeze- none,  dullness-none, rub- none              Chest wall-  Abd- Br/ Gen/ Rectal- Not done, not indicated Extrem- cyanosis- none, clubbing, none, atrophy- none, strength- nl Neuro- grossly intact to observation

## 2013-09-28 NOTE — Assessment & Plan Note (Signed)
Credits allergy vaccine for generally doing quite well. Mild seasonal exacerbation now. We discussed how to reduce exposure to decongestants by using only when necessary. Try plain Zyrtec/Sudafed PE rather than fixed combination

## 2013-09-28 NOTE — Assessment & Plan Note (Signed)
Better control without recent wheezing

## 2013-10-01 ENCOUNTER — Telehealth: Payer: Self-pay | Admitting: Internal Medicine

## 2013-10-01 NOTE — Telephone Encounter (Signed)
lmomtcb x1 for pt 

## 2013-10-01 NOTE — Telephone Encounter (Signed)
Pt states she thinks she may have a sinus infection; she is having sinus pressure with under eye pressure, headaches, and has started blowing green color from her nose. She is also having slight ear pressure as well. Pt denies any fever or chills.   CY Please advise. Thanks.  Allergies  Allergen Reactions  . Sulfonamide Derivatives     REACTION: rash   Current outpatient prescriptions:ADVAIR DISKUS 250-50 MCG/DOSE AEPB, INHALE 1 PUFF INTO THE LUNGS 2 TIMES DAILY *RINSE MOUTH AFTER USE*, Disp: 60 each, Rfl: PRN;  albuterol (PROVENTIL) (2.5 MG/3ML) 0.083% nebulizer solution, Take 2.5 mg by nebulization every 6 (six) hours as needed.  , Disp: , Rfl: ;  Azelastine-Fluticasone (DYMISTA) 137-50 MCG/ACT SUSP, Place 2 sprays into both nostrils at bedtime., Disp: 1 Bottle, Rfl: 1 cetirizine (ZYRTEC) 10 MG tablet, Take 10 mg by mouth daily., Disp: , Rfl: ;  EPIPEN 2-PAK 0.3 MG/0.3ML SOAJ injection, INJECT 0.3 MLS INTO THE MUSCLE ONCE AS DIRECTED, Disp: 1 Device, Rfl: PRN;  famotidine (PEPCID) 20 MG tablet, TAKE 1 TABLET BY MOUTH TWICE DAILY BEFORE MEALS, Disp: 60 tablet, Rfl: 3;  fish oil-omega-3 fatty acids 1000 MG capsule, Take 1 g by mouth daily.  , Disp: , Rfl:  Ipratropium-Albuterol (COMBIVENT RESPIMAT) 20-100 MCG/ACT AERS respimat, Inhale 1 puff into the lungs 4 (four) times daily as needed for wheezing., Disp: 4 g, Rfl: 11;  Multiple Vitamin (MULTIVITAMIN) capsule, Take 1 capsule by mouth daily.  , Disp: , Rfl: ;  naproxen sodium (ANAPROX) 220 MG tablet, Take 220 mg by mouth 2 (two) times daily with a meal.  , Disp: , Rfl: ;  rosuvastatin (CRESTOR) 40 MG tablet, Take 40 mg by mouth daily., Disp: , Rfl:  triamterene-hydrochlorothiazide (MAXZIDE-25) 37.5-25 MG per tablet, Take 1 tablet by mouth daily.  , Disp: , Rfl:

## 2013-10-01 NOTE — Telephone Encounter (Signed)
lmomtcb x 2  

## 2013-10-01 NOTE — Telephone Encounter (Signed)
Offer Augmentin 500 mg, # 14, 1 twice daily, may refill x 1

## 2013-10-02 MED ORDER — AMOXICILLIN-POT CLAVULANATE 500-125 MG PO TABS: 1.0000 | ORAL_TABLET | Freq: Two times a day (BID) | ORAL | Status: DC

## 2013-10-02 NOTE — Telephone Encounter (Signed)
Called and spoke with pt and she is aware of CY recs for the augmentin and pt is aware that this has been sent to the pharmacy.

## 2013-10-29 ENCOUNTER — Encounter: Payer: Self-pay | Admitting: Internal Medicine

## 2013-11-21 ENCOUNTER — Other Ambulatory Visit: Payer: Self-pay | Admitting: Internal Medicine

## 2013-12-19 ENCOUNTER — Ambulatory Visit (INDEPENDENT_AMBULATORY_CARE_PROVIDER_SITE_OTHER): Payer: 59

## 2013-12-19 ENCOUNTER — Ambulatory Visit (INDEPENDENT_AMBULATORY_CARE_PROVIDER_SITE_OTHER): Payer: 59 | Admitting: Family Medicine

## 2013-12-19 VITALS — BP 134/82 | HR 86 | Temp 98.2°F | Resp 18 | Ht 64.0 in | Wt 170.8 lb

## 2013-12-19 DIAGNOSIS — S99919A Unspecified injury of unspecified ankle, initial encounter: Secondary | ICD-10-CM

## 2013-12-19 DIAGNOSIS — M25579 Pain in unspecified ankle and joints of unspecified foot: Secondary | ICD-10-CM

## 2013-12-19 DIAGNOSIS — M79609 Pain in unspecified limb: Secondary | ICD-10-CM

## 2013-12-19 DIAGNOSIS — M79661 Pain in right lower leg: Secondary | ICD-10-CM

## 2013-12-19 DIAGNOSIS — M898X6 Other specified disorders of bone, lower leg: Secondary | ICD-10-CM

## 2013-12-19 DIAGNOSIS — S99921A Unspecified injury of right foot, initial encounter: Secondary | ICD-10-CM

## 2013-12-19 DIAGNOSIS — S99911A Unspecified injury of right ankle, initial encounter: Secondary | ICD-10-CM

## 2013-12-19 DIAGNOSIS — M25571 Pain in right ankle and joints of right foot: Secondary | ICD-10-CM

## 2013-12-19 DIAGNOSIS — S99929A Unspecified injury of unspecified foot, initial encounter: Secondary | ICD-10-CM

## 2013-12-19 DIAGNOSIS — M79671 Pain in right foot: Secondary | ICD-10-CM

## 2013-12-19 DIAGNOSIS — S8990XA Unspecified injury of unspecified lower leg, initial encounter: Secondary | ICD-10-CM

## 2013-12-19 DIAGNOSIS — S8991XA Unspecified injury of right lower leg, initial encounter: Secondary | ICD-10-CM

## 2013-12-19 DIAGNOSIS — I10 Essential (primary) hypertension: Secondary | ICD-10-CM

## 2013-12-19 NOTE — Patient Instructions (Signed)
Tylenol or Alleve if needed. Ice at end of day and elevation if needed. Range of motion, then exercises on handout as tolerated. If still with pain with walking into next week, can switch to a walking boot if needed.  Return to the clinic or go to the nearest emergency room if any of your symptoms worsen or new symptoms occur.  Acute Ankle Sprain with Phase I Rehab An acute ankle sprain is a partial or complete tear in one or more of the ligaments of the ankle due to traumatic injury. The severity of the injury depends on both the number of ligaments sprained and the grade of sprain. There are 3 grades of sprains.   A grade 1 sprain is a mild sprain. There is a slight pull without obvious tearing. There is no loss of strength, and the muscle and ligament are the correct length.  A grade 2 sprain is a moderate sprain. There is tearing of fibers within the substance of the ligament where it connects two bones or two cartilages. The length of the ligament is increased, and there is usually decreased strength.  A grade 3 sprain is a complete rupture of the ligament and is uncommon. In addition to the grade of sprain, there are three types of ankle sprains.  Lateral ankle sprains: This is a sprain of one or more of the three ligaments on the outer side (lateral) of the ankle. These are the most common sprains. Medial ankle sprains: There is one large triangular ligament of the inner side (medial) of the ankle that is susceptible to injury. Medial ankle sprains are less common. Syndesmosis, "high ankle," sprains: The syndesmosis is the ligament that connects the two bones of the lower leg. Syndesmosis sprains usually only occur with very severe ankle sprains. SYMPTOMS  Pain, tenderness, and swelling in the ankle, starting at the side of injury that may progress to the whole ankle and foot with time.  "Pop" or tearing sensation at the time of injury.  Bruising that may spread to the heel.  Impaired  ability to walk soon after injury. CAUSES   Acute ankle sprains are caused by trauma placed on the ankle that temporarily forces or pries the anklebone (talus) out of its normal socket.  Stretching or tearing of the ligaments that normally hold the joint in place (usually due to a twisting injury). RISK INCREASES WITH:  Previous ankle sprain.  Sports in which the foot may land awkwardly (i.e., basketball, volleyball, or soccer) or walking or running on uneven or rough surfaces.  Shoes with inadequate support to prevent sideways motion when stress occurs.  Poor strength and flexibility.  Poor balance skills.  Contact sports. PREVENTION   Warm up and stretch properly before activity.  Maintain physical fitness:  Ankle and leg flexibility, muscle strength, and endurance.  Cardiovascular fitness.  Balance training activities.  Use proper technique and have a coach correct improper technique.  Taping, protective strapping, bracing, or high-top tennis shoes may help prevent injury. Initially, tape is best; however, it loses most of its support function within 10 to 15 minutes.  Wear proper-fitted protective shoes (High-top shoes with taping or bracing is more effective than either alone).  Provide the ankle with support during sports and practice activities for 12 months following injury. PROGNOSIS   If treated properly, ankle sprains can be expected to recover completely; however, the length of recovery depends on the degree of injury.  A grade 1 sprain usually heals enough in 5  to 7 days to allow modified activity and requires an average of 6 weeks to heal completely.  A grade 2 sprain requires 6 to 10 weeks to heal completely.  A grade 3 sprain requires 12 to 16 weeks to heal.  A syndesmosis sprain often takes more than 3 months to heal. RELATED COMPLICATIONS   Frequent recurrence of symptoms may result in a chronic problem. Appropriately addressing the problem the  first time decreases the frequency of recurrence and optimizes healing time. Severity of the initial sprain does not predict the likelihood of later instability.  Injury to other structures (bone, cartilage, or tendon).  A chronically unstable or arthritic ankle joint is a possibility with repeated sprains. TREATMENT Treatment initially involves the use of ice, medication, and compression bandages to help reduce pain and inflammation. Ankle sprains are usually immobilized in a walking cast or boot to allow for healing. Crutches may be recommended to reduce pressure on the injury. After immobilization, strengthening and stretching exercises may be necessary to regain strength and a full range of motion. Surgery is rarely needed to treat ankle sprains. MEDICATION   Nonsteroidal anti-inflammatory medications, such as aspirin and ibuprofen (do not take for the first 3 days after injury or within 7 days before surgery), or other minor pain relievers, such as acetaminophen, are often recommended. Take these as directed by your caregiver. Contact your caregiver immediately if any bleeding, stomach upset, or signs of an allergic reaction occur from these medications.  Ointments applied to the skin may be helpful.  Pain relievers may be prescribed as necessary by your caregiver. Do not take prescription pain medication for longer than 4 to 7 days. Use only as directed and only as much as you need. HEAT AND COLD  Cold treatment (icing) is used to relieve pain and reduce inflammation for acute and chronic cases. Cold should be applied for 10 to 15 minutes every 2 to 3 hours for inflammation and pain and immediately after any activity that aggravates your symptoms. Use ice packs or an ice massage.  Heat treatment may be used before performing stretching and strengthening activities prescribed by your caregiver. Use a heat pack or a warm soak. SEEK IMMEDIATE MEDICAL CARE IF:   Pain, swelling, or bruising  worsens despite treatment.  You experience pain, numbness, discoloration, or coldness in the foot or toes.  New, unexplained symptoms develop (drugs used in treatment may produce side effects.) EXERCISES  PHASE I EXERCISES RANGE OF MOTION (ROM) AND STRETCHING EXERCISES - Ankle Sprain, Acute Phase I, Weeks 1 to 2 These exercises may help you when beginning to restore flexibility in your ankle. You will likely work on these exercises for the 1 to 2 weeks after your injury. Once your physician, physical therapist, or athletic trainer sees adequate progress, he or she will advance your exercises. While completing these exercises, remember:   Restoring tissue flexibility helps normal motion to return to the joints. This allows healthier, less painful movement and activity.  An effective stretch should be held for at least 30 seconds.  A stretch should never be painful. You should only feel a gentle lengthening or release in the stretched tissue. RANGE OF MOTION - Dorsi/Plantar Flexion  While sitting with your right / left knee straight, draw the top of your foot upwards by flexing your ankle. Then reverse the motion, pointing your toes downward.  Hold each position for __________ seconds.  After completing your first set of exercises, repeat this exercise with your knee  bent. Repeat __________ times. Complete this exercise __________ times per day.  RANGE OF MOTION - Ankle Alphabet  Imagine your right / left big toe is a pen.  Keeping your hip and knee still, write out the entire alphabet with your "pen." Make the letters as large as you can without increasing any discomfort. Repeat __________ times. Complete this exercise __________ times per day.  STRENGTHENING EXERCISES - Ankle Sprain, Acute -Phase I, Weeks 1 to 2 These exercises may help you when beginning to restore strength in your ankle. You will likely work on these exercises for 1 to 2 weeks after your injury. Once your physician,  physical therapist, or athletic trainer sees adequate progress, he or she will advance your exercises. While completing these exercises, remember:   Muscles can gain both the endurance and the strength needed for everyday activities through controlled exercises.  Complete these exercises as instructed by your physician, physical therapist, or athletic trainer. Progress the resistance and repetitions only as guided.  You may experience muscle soreness or fatigue, but the pain or discomfort you are trying to eliminate should never worsen during these exercises. If this pain does worsen, stop and make certain you are following the directions exactly. If the pain is still present after adjustments, discontinue the exercise until you can discuss the trouble with your clinician. STRENGTH - Dorsiflexors  Secure a rubber exercise band/tubing to a fixed object (i.e., table, pole) and loop the other end around your right / left foot.  Sit on the floor facing the fixed object. The band/tubing should be slightly tense when your foot is relaxed.  Slowly draw your foot back toward you using your ankle and toes.  Hold this position for __________ seconds. Slowly release the tension in the band and return your foot to the starting position. Repeat __________ times. Complete this exercise __________ times per day.  STRENGTH - Plantar-flexors   Sit with your right / left leg extended. Holding onto both ends of a rubber exercise band/tubing, loop it around the ball of your foot. Keep a slight tension in the band.  Slowly push your toes away from you, pointing them downward.  Hold this position for __________ seconds. Return slowly, controlling the tension in the band/tubing. Repeat __________ times. Complete this exercise __________ times per day.  STRENGTH - Ankle Eversion  Secure one end of a rubber exercise band/tubing to a fixed object (table, pole). Loop the other end around your foot just before your  toes.  Place your fists between your knees. This will focus your strengthening at your ankle.  Drawing the band/tubing across your opposite foot, slowly, pull your little toe out and up. Make sure the band/tubing is positioned to resist the entire motion.  Hold this position for __________ seconds. Have your muscles resist the band/tubing as it slowly pulls your foot back to the starting position.  Repeat __________ times. Complete this exercise __________ times per day.  STRENGTH - Ankle Inversion  Secure one end of a rubber exercise band/tubing to a fixed object (table, pole). Loop the other end around your foot just before your toes.  Place your fists between your knees. This will focus your strengthening at your ankle.  Slowly, pull your big toe up and in, making sure the band/tubing is positioned to resist the entire motion.  Hold this position for __________ seconds.  Have your muscles resist the band/tubing as it slowly pulls your foot back to the starting position. Repeat __________ times. Complete  this exercises __________ times per day.  STRENGTH - Towel Curls  Sit in a chair positioned on a non-carpeted surface.  Place your right / left foot on a towel, keeping your heel on the floor.  Pull the towel toward your heel by only curling your toes. Keep your heel on the floor.  If instructed by your physician, physical therapist, or athletic trainer, add weight to the end of the towel. Repeat __________ times. Complete this exercise __________ times per day. Document Released: 12/14/2004 Document Revised: 09/29/2013 Document Reviewed: 08/27/2008 [facility] Patient Information 2015 [place], [place]. This information is not intended to replace advice given to you by your health care provider. Make sure you discuss any questions you have with your health care provider.

## 2013-12-19 NOTE — Progress Notes (Signed)
Subjective:    Patient ID: Heidi Tran, female    DOB: [birth year], 53 y.o.   MRN: [id number]  HPI Heidi Tran is a 53 y.o. female  Presents for R ankle pain. Fell and twisted R ankle 8 days ago.  Bruised, swollen initially, difficulty to WB initially, but able to work on it next day with wrapped with ACE, and ice, elevation and Alleve, and mm relaxer. Able to walk on some at work, now hurting more past few days. stiff 2 days ago and popped on outside and front. Throbbing ever since. No initial foot or knee pain. Has had some soreness into leg toward knee at times, no pain in knee or foot with weightbearing.   Hx of r ankle sprain 3 times prior. No prior fractures.   Hx of HTN , has taken alleve few times as above.   Patient Active Problem List   Diagnosis Date Noted  . GERD 02/11/2010  . HYPERTENSION 07/26/2008  . Seasonal and perennial allergic rhinitis 10/11/2007  . 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 ECZEMA 10/11/2007   Past Medical History  Diagnosis Date  . Allergic rhinitis, cause unspecified   . Asthma   . Unspecified essential hypertension    Past Surgical History  Procedure Laterality Date  . Total abdominal hysterectomy w/ bilateral salpingoophorectomy     Allergies  Allergen Reactions  . Sulfonamide Derivatives     REACTION: rash   Prior to Admission medications   Medication Sig Start Date End Date Taking? Authorizing Provider  ADVAIR DISKUS 250-50 MCG/DOSE AEPB INHALE 1 PUFF INTO THE LUNGS 2 TIMES DAILY *RINSE MOUTH AFTER USE* 03/12/13  Yes Waymon Budge, MD  albuterol (PROVENTIL) (2.5 MG/3ML) 0.083% nebulizer solution Take 2.5 mg by nebulization every 6 (six) hours as needed.     Yes Historical Provider, MD  amoxicillin-clavulanate (AUGMENTIN) 500-125 MG per tablet Take 1 tablet (500 mg total) by mouth 2 (two) times daily. 10/02/13  Yes Waymon Budge, MD  Azelastine-Fluticasone (DYMISTA) 137-50 MCG/ACT SUSP Place 2 sprays into both nostrils at  bedtime. 09/05/12  Yes Waymon Budge, MD  cetirizine (ZYRTEC) 10 MG tablet Take 10 mg by mouth daily.   Yes Historical Provider, MD  COMBIVENT RESPIMAT 20-100 MCG/ACT AERS respimat INHALE 1 PUFF BY MOUTH EVERY 4 HOURS AS NEEDED 11/21/13  Yes Clinton D Young, MD  EPIPEN 2-PAK 0.3 MG/0.3ML SOAJ injection INJECT 0.3 MLS INTO THE MUSCLE ONCE AS DIRECTED 07/10/13  Yes Waymon Budge, MD  famotidine (PEPCID) 20 MG tablet TAKE 1 TABLET BY MOUTH TWICE DAILY BEFORE MEALS 06/26/13  Yes Waymon Budge, MD  fish oil-omega-3 fatty acids 1000 MG capsule Take 1 g by mouth daily.     Yes Historical Provider, MD  Multiple Vitamin (MULTIVITAMIN) capsule Take 1 capsule by mouth daily.     Yes Historical Provider, MD  naproxen sodium (ANAPROX) 220 MG tablet Take 220 mg by mouth 2 (two) times daily with a meal.     Yes Historical Provider, MD  rosuvastatin (CRESTOR) 40 MG tablet Take 40 mg by mouth daily.   Yes Historical Provider, MD  triamterene-hydrochlorothiazide (MAXZIDE-25) 37.5-25 MG per tablet Take 1 tablet by mouth daily.     Yes Historical Provider, MD   History   Social History  . Marital Status: Single    Spouse Name: N/A    Number of Children: N/A  . Years of Education: N/A   Occupational History  . Secretary    Social History Main  Topics  . Smoking status: Never Smoker   . Smokeless tobacco: Not on file  . Alcohol Use: No  . Drug Use: Not on file  . Sexual Activity: Not on file   Other Topics Concern  . Not on file   Social History Narrative  . No narrative on file       Review of Systems  Musculoskeletal: Positive for arthralgias, joint swelling and myalgias (front of R leg. ).  Skin: Negative for wound (few initial abrasions, no wounds now. ).       Objective:   Physical Exam  Vitals reviewed. Constitutional: She is oriented to person, place, and time. She appears well-developed and well-nourished. No distress.  HENT:  Head: Normocephalic and atraumatic.    Pulmonary/Chest: Effort normal.  Musculoskeletal:       Right knee: She exhibits bony tenderness. She exhibits normal range of motion and no swelling. No tenderness found. No medial joint line and no lateral joint line tenderness noted.       Right ankle: She exhibits decreased range of motion and swelling (lateral ankle. ). She exhibits no ecchymosis, no deformity and normal pulse. Tenderness (slight ttp over naviciula, 5th mt and diffusely dorsal-lateral foot to lateral ankle. ttp over lateral malleolous. ). Lateral malleolus, CF ligament, head of 5th metatarsal and proximal fibula tenderness found. Achilles tendon normal. Achilles tendon exhibits no pain and no defect.       Legs:      Right foot: She exhibits decreased range of motion, tenderness and bony tenderness.  nvi distally.   Neurological: She is alert and oriented to person, place, and time.  Skin: Skin is warm, dry and intact. No bruising, no ecchymosis and no rash noted. No erythema.  Psychiatric: She has a normal mood and affect. Her behavior is normal.   Filed Vitals:   12/19/13 1212  BP: 134/82  Pulse: 86  Temp: 98.2 F (36.8 C)  TempSrc: Oral  Resp: 18  Height: 5\' 4"  (1.626 m)  Weight: 170 lb 12.8 oz (77.474 kg)  SpO2: 98%   [facility] reading (PRIMARY) by  Dr. Neva Seat: R tib/fib: negative R ankle: negative R foot: negative     Assessment & Plan:  Heidi Tran is a 53 y.o. female Foot injury, right, initial encounter - Plan: DG Tibia/Fibula Right, DG Ankle Complete Right, DG Foot Complete Right  Right foot pain - Plan: DG Tibia/Fibula Right, DG Ankle Complete Right, DG Foot Complete Right  Right ankle pain - Plan: DG Tibia/Fibula Right, DG Ankle Complete Right, DG Foot Complete Right  Pain of right fibula - Plan: DG Tibia/Fibula Right, DG Ankle Complete Right, DG Foot Complete Right  Ankle injury, right, initial encounter - Plan: DG Tibia/Fibula Right, DG Ankle Complete Right, DG Foot Complete Right  Knee  injury, right, initial encounter - Plan: DG Tibia/Fibula Right, DG Ankle Complete Right, DG Foot Complete Right  Essential hypertension  R  Lateral ankle sprain with prior sprains. No apparent fracture. Soreness to knee and into foot may be related to change in activity/different muscle use with ankle pain. No apparent fx on XR.   -sweedo brace applied, felt more comfortable with walking in this and no pain into foot or knee. If these areas do become sore - can change out for a tall cam and possibly crutches if needed for pain. If tolerating sweedo - HEP by handout and theraband given. rtc precautions. Ok to try few alleve otc if needed, but to watch BP with  underlying HTN.   No orders of the defined types were placed in this encounter.   Patient Instructions  Tylenol or Alleve if needed. Ice at end of day and elevation if needed. Range of motion, then exercises on handout as tolerated. If still with pain with walking into next week, can switch to a walking boot if needed.  Return to the clinic or go to the nearest emergency room if any of your symptoms worsen or new symptoms occur.  Acute Ankle Sprain with Phase I Rehab An acute ankle sprain is a partial or complete tear in one or more of the ligaments of the ankle due to traumatic injury. The severity of the injury depends on both the number of ligaments sprained and the grade of sprain. There are 3 grades of sprains.   A grade 1 sprain is a mild sprain. There is a slight pull without obvious tearing. There is no loss of strength, and the muscle and ligament are the correct length.  A grade 2 sprain is a moderate sprain. There is tearing of fibers within the substance of the ligament where it connects two bones or two cartilages. The length of the ligament is increased, and there is usually decreased strength.  A grade 3 sprain is a complete rupture of the ligament and is uncommon. In addition to the grade of sprain, there are three types of  ankle sprains.  Lateral ankle sprains: This is a sprain of one or more of the three ligaments on the outer side (lateral) of the ankle. These are the most common sprains. Medial ankle sprains: There is one large triangular ligament of the inner side (medial) of the ankle that is susceptible to injury. Medial ankle sprains are less common. Syndesmosis, "high ankle," sprains: The syndesmosis is the ligament that connects the two bones of the lower leg. Syndesmosis sprains usually only occur with very severe ankle sprains. SYMPTOMS  Pain, tenderness, and swelling in the ankle, starting at the side of injury that may progress to the whole ankle and foot with time.  "Pop" or tearing sensation at the time of injury.  Bruising that may spread to the heel.  Impaired ability to walk soon after injury. CAUSES   Acute ankle sprains are caused by trauma placed on the ankle that temporarily forces or pries the anklebone (talus) out of its normal socket.  Stretching or tearing of the ligaments that normally hold the joint in place (usually due to a twisting injury). RISK INCREASES WITH:  Previous ankle sprain.  Sports in which the foot may land awkwardly (i.e., basketball, volleyball, or soccer) or walking or running on uneven or rough surfaces.  Shoes with inadequate support to prevent sideways motion when stress occurs.  Poor strength and flexibility.  Poor balance skills.  Contact sports. PREVENTION   Warm up and stretch properly before activity.  Maintain physical fitness:  Ankle and leg flexibility, muscle strength, and endurance.  Cardiovascular fitness.  Balance training activities.  Use proper technique and have a coach correct improper technique.  Taping, protective strapping, bracing, or high-top tennis shoes may help prevent injury. Initially, tape is best; however, it loses most of its support function within 10 to 15 minutes.  Wear proper-fitted protective shoes  (High-top shoes with taping or bracing is more effective than either alone).  Provide the ankle with support during sports and practice activities for 12 months following injury. PROGNOSIS   If treated properly, ankle sprains can be expected to recover completely;  however, the length of recovery depends on the degree of injury.  A grade 1 sprain usually heals enough in 5 to 7 days to allow modified activity and requires an average of 6 weeks to heal completely.  A grade 2 sprain requires 6 to 10 weeks to heal completely.  A grade 3 sprain requires 12 to 16 weeks to heal.  A syndesmosis sprain often takes more than 3 months to heal. RELATED COMPLICATIONS   Frequent recurrence of symptoms may result in a chronic problem. Appropriately addressing the problem the first time decreases the frequency of recurrence and optimizes healing time. Severity of the initial sprain does not predict the likelihood of later instability.  Injury to other structures (bone, cartilage, or tendon).  A chronically unstable or arthritic ankle joint is a possibility with repeated sprains. TREATMENT Treatment initially involves the use of ice, medication, and compression bandages to help reduce pain and inflammation. Ankle sprains are usually immobilized in a walking cast or boot to allow for healing. Crutches may be recommended to reduce pressure on the injury. After immobilization, strengthening and stretching exercises may be necessary to regain strength and a full range of motion. Surgery is rarely needed to treat ankle sprains. MEDICATION   Nonsteroidal anti-inflammatory medications, such as aspirin and ibuprofen (do not take for the first 3 days after injury or within 7 days before surgery), or other minor pain relievers, such as acetaminophen, are often recommended. Take these as directed by your caregiver. Contact your caregiver immediately if any bleeding, stomach upset, or signs of an allergic reaction occur  from these medications.  Ointments applied to the skin may be helpful.  Pain relievers may be prescribed as necessary by your caregiver. Do not take prescription pain medication for longer than 4 to 7 days. Use only as directed and only as much as you need. HEAT AND COLD  Cold treatment (icing) is used to relieve pain and reduce inflammation for acute and chronic cases. Cold should be applied for 10 to 15 minutes every 2 to 3 hours for inflammation and pain and immediately after any activity that aggravates your symptoms. Use ice packs or an ice massage.  Heat treatment may be used before performing stretching and strengthening activities prescribed by your caregiver. Use a heat pack or a warm soak. SEEK IMMEDIATE MEDICAL CARE IF:   Pain, swelling, or bruising worsens despite treatment.  You experience pain, numbness, discoloration, or coldness in the foot or toes.  New, unexplained symptoms develop (drugs used in treatment may produce side effects.) EXERCISES  PHASE I EXERCISES RANGE OF MOTION (ROM) AND STRETCHING EXERCISES - Ankle Sprain, Acute Phase I, Weeks 1 to 2 These exercises may help you when beginning to restore flexibility in your ankle. You will likely work on these exercises for the 1 to 2 weeks after your injury. Once your physician, physical therapist, or athletic trainer sees adequate progress, he or she will advance your exercises. While completing these exercises, remember:   Restoring tissue flexibility helps normal motion to return to the joints. This allows healthier, less painful movement and activity.  An effective stretch should be held for at least 30 seconds.  A stretch should never be painful. You should only feel a gentle lengthening or release in the stretched tissue. RANGE OF MOTION - Dorsi/Plantar Flexion  While sitting with your right / left knee straight, draw the top of your foot upwards by flexing your ankle. Then reverse the motion, pointing your toes  downward.  Hold each position for __________ seconds.  After completing your first set of exercises, repeat this exercise with your knee bent. Repeat __________ times. Complete this exercise __________ times per day.  RANGE OF MOTION - Ankle Alphabet  Imagine your right / left big toe is a pen.  Keeping your hip and knee still, write out the entire alphabet with your "pen." Make the letters as large as you can without increasing any discomfort. Repeat __________ times. Complete this exercise __________ times per day.  STRENGTHENING EXERCISES - Ankle Sprain, Acute -Phase I, Weeks 1 to 2 These exercises may help you when beginning to restore strength in your ankle. You will likely work on these exercises for 1 to 2 weeks after your injury. Once your physician, physical therapist, or athletic trainer sees adequate progress, he or she will advance your exercises. While completing these exercises, remember:   Muscles can gain both the endurance and the strength needed for everyday activities through controlled exercises.  Complete these exercises as instructed by your physician, physical therapist, or athletic trainer. Progress the resistance and repetitions only as guided.  You may experience muscle soreness or fatigue, but the pain or discomfort you are trying to eliminate should never worsen during these exercises. If this pain does worsen, stop and make certain you are following the directions exactly. If the pain is still present after adjustments, discontinue the exercise until you can discuss the trouble with your clinician. STRENGTH - Dorsiflexors  Secure a rubber exercise band/tubing to a fixed object (i.e., table, pole) and loop the other end around your right / left foot.  Sit on the floor facing the fixed object. The band/tubing should be slightly tense when your foot is relaxed.  Slowly draw your foot back toward you using your ankle and toes.  Hold this position for __________  seconds. Slowly release the tension in the band and return your foot to the starting position. Repeat __________ times. Complete this exercise __________ times per day.  STRENGTH - Plantar-flexors   Sit with your right / left leg extended. Holding onto both ends of a rubber exercise band/tubing, loop it around the ball of your foot. Keep a slight tension in the band.  Slowly push your toes away from you, pointing them downward.  Hold this position for __________ seconds. Return slowly, controlling the tension in the band/tubing. Repeat __________ times. Complete this exercise __________ times per day.  STRENGTH - Ankle Eversion  Secure one end of a rubber exercise band/tubing to a fixed object (table, pole). Loop the other end around your foot just before your toes.  Place your fists between your knees. This will focus your strengthening at your ankle.  Drawing the band/tubing across your opposite foot, slowly, pull your little toe out and up. Make sure the band/tubing is positioned to resist the entire motion.  Hold this position for __________ seconds. Have your muscles resist the band/tubing as it slowly pulls your foot back to the starting position.  Repeat __________ times. Complete this exercise __________ times per day.  STRENGTH - Ankle Inversion  Secure one end of a rubber exercise band/tubing to a fixed object (table, pole). Loop the other end around your foot just before your toes.  Place your fists between your knees. This will focus your strengthening at your ankle.  Slowly, pull your big toe up and in, making sure the band/tubing is positioned to resist the entire motion.  Hold this position for __________ seconds.  Have your  muscles resist the band/tubing as it slowly pulls your foot back to the starting position. Repeat __________ times. Complete this exercises __________ times per day.  STRENGTH - Towel Curls  Sit in a chair positioned on a non-carpeted  surface.  Place your right / left foot on a towel, keeping your heel on the floor.  Pull the towel toward your heel by only curling your toes. Keep your heel on the floor.  If instructed by your physician, physical therapist, or athletic trainer, add weight to the end of the towel. Repeat __________ times. Complete this exercise __________ times per day. Document Released: 12/14/2004 Document Revised: 09/29/2013 Document Reviewed: 08/27/2008 [facility] Patient Information 2015 [place], [place]. This information is not intended to replace advice given to you by your health care provider. Make sure you discuss any questions you have with your health care provider.

## 2014-03-03 ENCOUNTER — Ambulatory Visit (INDEPENDENT_AMBULATORY_CARE_PROVIDER_SITE_OTHER): Payer: 59

## 2014-03-03 ENCOUNTER — Ambulatory Visit (INDEPENDENT_AMBULATORY_CARE_PROVIDER_SITE_OTHER): Payer: 59 | Admitting: Internal Medicine

## 2014-03-03 ENCOUNTER — Encounter: Payer: Self-pay | Admitting: Internal Medicine

## 2014-03-03 ENCOUNTER — Encounter (INDEPENDENT_AMBULATORY_CARE_PROVIDER_SITE_OTHER): Payer: Self-pay

## 2014-03-03 VITALS — BP 134/76 | HR 91 | Ht 62.5 in | Wt 173.0 lb

## 2014-03-03 DIAGNOSIS — J309 Allergic rhinitis, unspecified: Secondary | ICD-10-CM

## 2014-03-03 DIAGNOSIS — J45998 Other asthma: Secondary | ICD-10-CM

## 2014-03-03 MED ORDER — ALBUTEROL SULFATE (2.5 MG/3ML) 0.083% IN NEBU: 2.5000 mg | INHALATION_SOLUTION | Freq: Four times a day (QID) | RESPIRATORY_TRACT | Status: DC | PRN

## 2014-03-03 MED ORDER — AZELASTINE-FLUTICASONE 137-50 MCG/ACT NA SUSP: 2.0000 | Freq: Every day | NASAL | Status: DC

## 2014-03-03 MED ORDER — EPIPEN 2-PAK 0.3 MG/0.3ML IJ SOAJ: INTRAMUSCULAR | Status: DC

## 2014-03-03 MED ORDER — ADVAIR DISKUS 250-50 MCG/DOSE IN AEPB: INHALATION_SPRAY | RESPIRATORY_TRACT | Status: DC

## 2014-03-03 MED ORDER — COMBIVENT RESPIMAT 20-100 MCG/ACT IN AERS: INHALATION_SPRAY | RESPIRATORY_TRACT | Status: DC

## 2014-03-03 MED ORDER — FAMOTIDINE 20 MG PO TABS: ORAL_TABLET | ORAL | Status: DC

## 2014-03-03 NOTE — Progress Notes (Signed)
Patient ID: Heidi Tran, female    DOB: [birth year], 53 y.o.   MRN: [id number]  HPI 11/21/10- 32 yoF followed for asthma, allergic rhinitis, complicated by GERD and Eczema Last here March 16, 2010- note reviewed. Reports an asthma attack before work 4 days ago, blamed on rushing and heat. She used her nebulizer and rescue inhaler. Still feels a little weak, coughing more than usual, nonproductive. Denies having any cold/ infection.  Continues allergy vaccine.  12/20/10-49 yoF never smoker followed for asthma, allergic rhinitis, complicated by GERD and Eczema Acute visit- Began 5 days ago with wheezing, controlled with meds, but also nauseated/ vomited. Nebulizer sometimes gives some loose stools, but this was different. Finally kept supper down last night. Started prednisone 7/21 and wheeze now is much better.  GI is getting better. Has 3-4 more days on prednisone. Has not had antibiotic or any unfamiliar medicine. Missed most of a week of work- went back yesterday.   12/30/10-49 yoF never smoker followed for asthma, allergic rhinitis, complicated by GERD and Eczema After last vist was only able to work 2 days, then out now again for past week. She has been out most of the past month. Yesterday began sharp pain just left of midsternum through to left back. Used nebulizer repeatedly 2 days ago- did overstimulate some. Dry hacky cough with no sneeze or itch. Heart burn acting up- once daily pepcid some help- discussed bid and before meals. Some nasal mucus is green and head feels congested.  Continues allergy vaccine. She is blaming stress at home and work -just inherited a teenage nephew-, job stress, heat.  Finished latest pred taper- causes nausea.   01/16/11- 49 yoF never smoker followed for asthma, allergic rhinitis, complicated by GERD and Eczema Throat clearing cough without wheeze. Tries to clear but cough is dry. "Weight of world" on chest sensation, centered just left of sternum as deep  ache. Going to get more Pepcid to continue twice daily. Seeking short term disability.  Continues allergy vaccine. Continues to have nephew in her home.   02/17/11- 49 yoF never smoker followed for asthma, allergic rhinitis, complicated by GERD and Eczema Breathing comfortably at rest now. Wakes from sleep snoring or coughing, with dry throat. Will use rescue inhaler to relieve tight chest. Admits some reflux if forgets Pepcid 2x daily. Applying for short and long term disability simultaneously.  Continues allergy vaccine. Feels more sinus pressure/ congestion which she relates to fall season/ weather.  PFT- WNL 01/25/11- FEV1/FVC 0.78 w/o response to bronchodilator.  Feet may swell a little some times. Left of midsternal soreness at times.  She had taken prednisone 10 mg daily, ran out and quit last week. Continues Advair 500 and uses rescue inhaler at most twice daily. Nebulizer at least once most days.   03/31/11  50 yoF never smoker followed for asthma, allergic rhinitis, complicated by GERD and Eczema She has been off of prednisone since the last visit and we had changed pro air to Combivent. She continues allergy vaccine. She still feels "tired, exhausted" but clearly is much more comfortable. Easy exertional dyspnea. She is supposed to go back to work next week and I am encouraging her to do so. Pepcid seems adequate for acid control. She is using Advair 250/50 twice daily. She is still dealing at home with a teenage female who is living with her now.  05/31/11- 50 yoF never smoker followed for asthma, allergic rhinitis, complicated by GERD,  Eczema, stress She was able to  go back to work November 19. In the past week she has had discomfort she thought felt like a viral illness including myalgias and chilling without recognized fever or sore throat. She has been coughing more, described as a dry hacking cough. Wheezes some at night when she feels tight while lying down. Nasal congestion. Complains of  persistent weakness which has dragged on since the November illness and made worse by the current symptoms. She had discussed it with her employers and is asking me now to restrict her to a four-day work week for a while "till I can get my strength back". She has been treating herself with Aleve and throat lozenges.  10/06/11- 50 yoF never smoker followed for asthma, allergic rhinitis, complicated by GERD,  Eczema, stress She is now back at work 4 days a week. Still sometimes aware of shortness of breath. PFTs were normal in 2012.  02/08/12-  51 yoF never smoker followed for asthma, allergic rhinitis, complicated by GERD,  Eczema, stress Slight SOB this morning-? due to weather; otherwise doing well; on vaccine and no flare ups. Continues allergy vaccine 1:10 GO. Gets her flu shot at work. Rare need for rescue inhaler. She is back to working full time now.  08/06/12-  51 yoF never smoker followed for asthma, allergic rhinitis, complicated by GERD,  Eczema, stress Follows for : pt states having some sob,watery eyes,chest congestion, using rescue inhaler alot more. still on allergy vaccine 1:10 GO with no flare ups.  In the last 3 weeks has had watery eyes, some chest tightness and shortness of breath without wheeze. Using Flonase and Claritin. She had taken a prednisone taper during the winter.  09/18/12-  3 yoF never smoker followed for asthma, allergic rhinitis, complicated by GERD,  Eczema, stress FOLLOWS FOR: still on allergy vaccine 1:10 GO and doing well; has slight chest tightness from increased pollen outside. Likes Dymista. No longer having eczema.  03/03/13- 52 yoF never smoker followed for asthma, allergic rhinitis, complicated by GERD,  Eczema, stress ACUTE VISIT:  SOB, tightness in chest and wheezing x3 weeks and fatigued Gets flu vaccine at work PCP gave Rocephin, prednisone taper 4 weeks ago without help. Feels weak, dry cough and wheeze but never fever or green. Had sharp pain left  anterior upper chest through to scapula, pleuritic, resolved. Denies leg pain or swelling. Allergy vaccine 1:10 GO- doing well, continuing to give her own.  09/01/13- 52 yoF never smoker followed for asthma, allergic rhinitis, complicated by GERD,  Eczema, stress FOLLOWS FOR:  Increased sob since pollen season began over past two weeks.  Believes Zyrtec is causing elevated bp Allergy vaccine 1:10 GO- doing well, continuing to give her own. Increased nasal congestion this spring. Using Zyrtec D but it may raise her blood pressure. We discussed decongestants.  03/03/14- 53 yoF never smoker followed for asthma, allergic rhinitis, complicated by GERD,  Eczema, stress FOLLOWS FOR: Chest tightness and SOB recently-making her tired now.  Allergy vaccine 1:10 GO     Review of Systems-see HPI Constitutional:   No weight loss, night sweats,  Fevers, chills,+ fatigue, lassitude. HEENT:   No headaches,  Difficulty swallowing,  Tooth/dental problems,  Sore throat,                No- sneezing, itching, ear ache, +nasal congestion, post nasal drip,  CV:  No chest pain, orthopnea, PND, swelling in lower extremities, anasarca, dizziness, palpitations GI  No heartburn, indigestion, abdominal pain, , Resp:   No  coughing up of blood.  No change in color of mucus. No-wheeze  Skin: no rash GU:  MS:  No joint pain or swelling. Marland Kitchen. Psych:  No change in mood or affect.    + depression or anxiety- continues.  No memory loss.  Objective:   Physical Exam General- Alert, Oriented, Affect-appropriate, Distress- none acute.   Skin- rash-none, lesions- none, excoriation- none Lymphadenopathy- none Head- atraumatic            Eyes- Gross vision intact, PERRLA, conjunctivae clear secretions            Ears- Hearing, canals normal            Nose- Repeated sniffing, no -Septal dev, +mucus bridging, no-polyps, erosion, perforation             Throat- Mallampati III , mucosa clear , drainage- none, tonsils-  atrophic Neck- flexible , trachea midline, no stridor , thyroid nl, carotid no bruit Chest - symmetrical excursion , unlabored           Heart/CV- RRR , no murmur , no gallop  , no rub, nl s1 s2                           - JVD- none , edema- none, stasis changes- none, varices- none           Lung-  clear to P&A , wheeze- none,  dullness-none, rub- none              Chest wall-  Abd- Br/ Gen/ Rectal- Not done, not indicated Extrem- cyanosis- none, clubbing, none, atrophy- none, strength- nl Neuro- grossly intact to observation

## 2014-03-03 NOTE — Patient Instructions (Signed)
[facility]  Don't forget that flu shot at work !  We can continue allergy vaccine 1:10 GO  Please call as needed

## 2014-03-30 ENCOUNTER — Encounter: Payer: Self-pay | Admitting: Internal Medicine

## 2014-04-01 ENCOUNTER — Telehealth: Payer: Self-pay | Admitting: Internal Medicine

## 2014-04-01 DIAGNOSIS — J45998 Other asthma: Secondary | ICD-10-CM

## 2014-04-01 NOTE — Telephone Encounter (Signed)
Called and spoke to pt. Pt requested new neb machine d/t hers being broken.  Per CY-ok to send order for new neb machine. Order placed.  Called and left detailed message on pt's machine that we have sent the order in and to call back with any other issues.  Nothing further needed at this time.

## 2014-04-09 ENCOUNTER — Encounter: Payer: Self-pay | Admitting: Internal Medicine

## 2014-04-13 ENCOUNTER — Ambulatory Visit: Payer: 59 | Admitting: Family Medicine

## 2014-06-22 ENCOUNTER — Telehealth: Payer: Self-pay | Admitting: Internal Medicine

## 2014-06-22 MED ORDER — PREDNISONE 10 MG PO TABS: ORAL_TABLET | ORAL | Status: DC

## 2014-06-22 NOTE — Telephone Encounter (Signed)
Pt is aware of CY's recommendation. She would like rx sent in. This has been taken care of.

## 2014-06-22 NOTE — Telephone Encounter (Signed)
Spoke with pt, c/o sob with exertion, wheezing, sneezing since Friday.  Denies fever, prod cough.  Has been using nebs which help some but states she cannot get back to baseline.  Doesn't know if she needs pred taper or appt. Pt uses [facility] pharmacy.   Last ov: 03/03/14 Next ov: 09/04/14  Dr. Maple HudsonYoung please advise.  Thank you!  Allergies  Allergen Reactions  . Sulfonamide Derivatives     REACTION: rash   Current Outpatient Prescriptions on File Prior to Visit  Medication Sig Dispense Refill  . ADVAIR DISKUS 250-50 MCG/DOSE AEPB INHALE 1 PUFF INTO THE LUNGS 2 TIMES DAILY *RINSE MOUTH AFTER USE* 60 each PRN  . albuterol (PROVENTIL) (2.5 MG/3ML) 0.083% nebulizer solution Take 3 mLs (2.5 mg total) by nebulization every 6 (six) hours as needed. 75 mL prn  . Azelastine-Fluticasone (DYMISTA) 137-50 MCG/ACT SUSP Place 2 sprays into both nostrils at bedtime. 1 Bottle prn  . cetirizine (ZYRTEC) 10 MG tablet Take 10 mg by mouth daily.    . cetirizine-pseudoephedrine (ZYRTEC-D) 5-120 MG per tablet Take 1 tablet by mouth daily. Alternates with Zyrtec 10 mg as needed    . COMBIVENT RESPIMAT 20-100 MCG/ACT AERS respimat INHALE 1 PUFF BY MOUTH EVERY 4 HOURS AS NEEDED 36 g PRN  . EPIPEN 2-PAK 0.3 MG/0.3ML SOAJ injection Inject into thigh for severe allergic reaction 1 Device PRN  . famotidine (PEPCID) 20 MG tablet 1 twice daily before meals 60 tablet prn  . fish oil-omega-3 fatty acids 1000 MG capsule Take 1 g by mouth daily.      . Multiple Vitamin (MULTIVITAMIN) capsule Take 1 capsule by mouth daily.      . naproxen sodium (ANAPROX) 220 MG tablet Take 220 mg by mouth 2 (two) times daily with a meal.      . Pitavastatin Calcium (LIVALO) 2 MG TABS Take 1 tablet by mouth daily.    Marland Kitchen. triamterene-hydrochlorothiazide (MAXZIDE-25) 37.5-25 MG per tablet Take 1 tablet by mouth daily.       No current facility-administered medications on file prior to visit.

## 2014-06-22 NOTE — Telephone Encounter (Signed)
Offer prednisone 10 mg, # 20, 4 X 2 DAYS, 3 X 2 DAYS, 2 X 2 DAYS, 1 X 2 DAYS  

## 2014-06-23 ENCOUNTER — Telehealth: Payer: Self-pay | Admitting: Internal Medicine

## 2014-06-23 NOTE — Telephone Encounter (Signed)
Spoke with pt, states she is having difficulty with her FMLA and is needing a note from CY to write her out of work for her asthma.  States she was out of work for asthma January 5,6,7,8 and 22,25,26.  States FMLA covered her being out of work on Jan 5 and 6, but no other dates.  States she needs this note to specify the 7th, 8th, 22nd, 25th, and 26th.  She also would like this note to make note of the fact that asthma is not always predictable and that she may be out intermittently because of this.  Needs this letter to be sent to [employer] [address] [place] [id number] [place] [phone] fax [phone] attn: Lelon MastSamantha.    Last ov: 03/03/14 Next ov: 09/04/14  Dr. Maple HudsonYoung are you ok with writing this letter?  Thanks.

## 2014-06-23 NOTE — Telephone Encounter (Signed)
lmtcb X1 for pt  

## 2014-06-24 ENCOUNTER — Encounter: Payer: Self-pay | Admitting: Internal Medicine

## 2014-06-25 NOTE — Telephone Encounter (Signed)
Done

## 2014-06-25 NOTE — Telephone Encounter (Signed)
CY - please advise. Thanks! 

## 2014-06-26 NOTE — Telephone Encounter (Signed)
[facility] [employer].  Pt aware and voiced no further questions or concerns at this time.

## 2014-07-02 ENCOUNTER — Telehealth: Payer: Self-pay | Admitting: Internal Medicine

## 2014-07-02 NOTE — Telephone Encounter (Signed)
LMTCB

## 2014-07-03 ENCOUNTER — Encounter: Payer: Self-pay | Admitting: Internal Medicine

## 2014-07-03 NOTE — Telephone Encounter (Signed)
lmtcb x2 

## 2014-07-03 NOTE — Telephone Encounter (Signed)
Called and spoke to pt. Pt stated she is needing a letter faxed to [employer] [phone] attn: Lelon MastSamantha) for her FMLA. Pt stated the letters needs her to excuse her from work from Jan 27-29 and her returning her to work on Feb 1st.   CY please advise.

## 2014-07-03 NOTE — Telephone Encounter (Signed)
Pt returning call.Heidi Tran ° °

## 2014-07-03 NOTE — Telephone Encounter (Signed)
Letter signed and faxed to [employer].  Nothing further needed.

## 2014-07-07 ENCOUNTER — Telehealth: Payer: Self-pay | Admitting: Internal Medicine

## 2014-07-07 NOTE — Telephone Encounter (Signed)
Spoke with pt, advised that I saw no attempts yesterday afternoon from our office to contact her.  Nothing further needed.

## 2014-08-04 ENCOUNTER — Ambulatory Visit (INDEPENDENT_AMBULATORY_CARE_PROVIDER_SITE_OTHER): Payer: 59

## 2014-08-04 DIAGNOSIS — J309 Allergic rhinitis, unspecified: Secondary | ICD-10-CM

## 2014-08-27 ENCOUNTER — Encounter: Payer: Self-pay | Admitting: Internal Medicine

## 2014-09-04 ENCOUNTER — Ambulatory Visit (INDEPENDENT_AMBULATORY_CARE_PROVIDER_SITE_OTHER): Payer: 59 | Admitting: Internal Medicine

## 2014-09-04 ENCOUNTER — Encounter: Payer: Self-pay | Admitting: Internal Medicine

## 2014-09-04 VITALS — BP 120/62 | HR 87 | Ht 62.5 in | Wt 167.4 lb

## 2014-09-04 DIAGNOSIS — J302 Other seasonal allergic rhinitis: Secondary | ICD-10-CM

## 2014-09-04 DIAGNOSIS — J309 Allergic rhinitis, unspecified: Secondary | ICD-10-CM

## 2014-09-04 DIAGNOSIS — J3089 Other allergic rhinitis: Principal | ICD-10-CM

## 2014-09-04 DIAGNOSIS — J45998 Other asthma: Secondary | ICD-10-CM | POA: Diagnosis not present

## 2014-09-04 NOTE — Assessment & Plan Note (Signed)
Mild intermittent without complication. Asthma is not waking her during the night and she is only infrequently needing rescue inhaler now.

## 2014-09-04 NOTE — Patient Instructions (Signed)
We can continue present meds, and your allergy vaccine at 1:10 GO  Please call if we can help

## 2014-09-04 NOTE — Progress Notes (Signed)
Patient ID: Heidi Tran, female    DOB: [birth year], 54 y.o.   MRN: [id number]  HPI 11/21/10- 54 yoF followed for asthma, allergic rhinitis, complicated by GERD and Eczema Last here March 16, 2010- note reviewed. Reports an asthma attack before work 4 days ago, blamed on rushing and heat. She used her nebulizer and rescue inhaler. Still feels a little weak, coughing more than usual, nonproductive. Denies having any cold/ infection.  Continues allergy vaccine.  12/20/10-54 yoF never smoker followed for asthma, allergic rhinitis, complicated by GERD and Eczema Acute visit- Began 5 days ago with wheezing, controlled with meds, but also nauseated/ vomited. Nebulizer sometimes gives some loose stools, but this was different. Finally kept supper down last night. Started prednisone 7/21 and wheeze now is much better.  GI is getting better. Has 3-4 more days on prednisone. Has not had antibiotic or any unfamiliar medicine. Missed most of a week of work- went back yesterday.   12/30/10-54 yoF never smoker followed for asthma, allergic rhinitis, complicated by GERD and Eczema After last vist was only able to work 2 days, then out now again for past week. She has been out most of the past month. Yesterday began sharp pain just left of midsternum through to left back. Used nebulizer repeatedly 2 days ago- did overstimulate some. Dry hacky cough with no sneeze or itch. Heart burn acting up- once daily pepcid some help- discussed bid and before meals. Some nasal mucus is green and head feels congested.  Continues allergy vaccine. She is blaming stress at home and work -just inherited a teenage nephew-, job stress, heat.  Finished latest pred taper- causes nausea.   01/16/11- 54 yoF never smoker followed for asthma, allergic rhinitis, complicated by GERD and Eczema Throat clearing cough without wheeze. Tries to clear but cough is dry. "Weight of world" on chest sensation, centered just left of sternum as deep  ache. Going to get more Pepcid to continue twice daily. Seeking short term disability.  Continues allergy vaccine. Continues to have nephew in her home.   02/17/11- 54 yoF never smoker followed for asthma, allergic rhinitis, complicated by GERD and Eczema Breathing comfortably at rest now. Wakes from sleep snoring or coughing, with dry throat. Will use rescue inhaler to relieve tight chest. Admits some reflux if forgets Pepcid 2x daily. Applying for short and long term disability simultaneously.  Continues allergy vaccine. Feels more sinus pressure/ congestion which she relates to fall season/ weather.  PFT- WNL 01/25/11- FEV1/FVC 0.78 w/o response to bronchodilator.  Feet may swell a little some times. Left of midsternal soreness at times.  She had taken prednisone 10 mg daily, ran out and quit last week. Continues Advair 500 and uses rescue inhaler at most twice daily. Nebulizer at least once most days.   03/31/11  54 yoF never smoker followed for asthma, allergic rhinitis, complicated by GERD and Eczema She has been off of prednisone since the last visit and we had changed pro air to Combivent. She continues allergy vaccine. She still feels "tired, exhausted" but clearly is much more comfortable. Easy exertional dyspnea. She is supposed to go back to work next week and I am encouraging her to do so. Pepcid seems adequate for acid control. She is using Advair 250/50 twice daily. She is still dealing at home with a teenage female who is living with her now.  05/31/11- 54 yoF never smoker followed for asthma, allergic rhinitis, complicated by GERD,  Eczema, stress She was able to  go back to work November 19. In the past week she has had discomfort she thought felt like a viral illness including myalgias and chilling without recognized fever or sore throat. She has been coughing more, described as a dry hacking cough. Wheezes some at night when she feels tight while lying down. Nasal congestion. Complains of  persistent weakness which has dragged on since the November illness and made worse by the current symptoms. She had discussed it with her employers and is asking me now to restrict her to a four-day work week for a while "till I can get my strength back". She has been treating herself with Aleve and throat lozenges.  10/06/11- 54 yoF never smoker followed for asthma, allergic rhinitis, complicated by GERD,  Eczema, stress She is now back at work 4 days a week. Still sometimes aware of shortness of breath. PFTs were normal in 2012.  02/08/12-  54 yoF never smoker followed for asthma, allergic rhinitis, complicated by GERD,  Eczema, stress Slight SOB this morning-? due to weather; otherwise doing well; on vaccine and no flare ups. Continues allergy vaccine 1:10 GO. Gets her flu shot at work. Rare need for rescue inhaler. She is back to working full time now.  08/06/12-  54 yoF never smoker followed for asthma, allergic rhinitis, complicated by GERD,  Eczema, stress Follows for : pt states having some sob,watery eyes,chest congestion, using rescue inhaler alot more. still on allergy vaccine 1:10 GO with no flare ups.  In the last 3 weeks has had watery eyes, some chest tightness and shortness of breath without wheeze. Using Flonase and Claritin. She had taken a prednisone taper during the winter.  09/18/12-  54 yoF never smoker followed for asthma, allergic rhinitis, complicated by GERD,  Eczema, stress FOLLOWS FOR: still on allergy vaccine 1:10 GO and doing well; has slight chest tightness from increased pollen outside. Likes Dymista. No longer having eczema.  03/03/13- 54 yoF never smoker followed for asthma, allergic rhinitis, complicated by GERD,  Eczema, stress ACUTE VISIT:  SOB, tightness in chest and wheezing x3 weeks and fatigued Gets flu vaccine at work PCP gave Rocephin, prednisone taper 4 weeks ago without help. Feels weak, dry cough and wheeze but never fever or green. Had sharp pain left  anterior upper chest through to scapula, pleuritic, resolved. Denies leg pain or swelling. Allergy vaccine 1:10 GO- doing well, continuing to give her own.  09/01/13- 54 yoF never smoker followed for asthma, allergic rhinitis, complicated by GERD,  Eczema, stress FOLLOWS FOR:  Increased sob since pollen season began over past two weeks.  Believes Zyrtec is causing elevated bp Allergy vaccine 1:10 GO- doing well, continuing to give her own. Increased nasal congestion this spring. Using Zyrtec D but it may raise her blood pressure. We discussed decongestants.  03/03/14- 53 yoF never smoker followed for asthma, allergic rhinitis, complicated by GERD,  Eczema, stress FOLLOWS FOR: Chest tightness and SOB recently-making her tired now.  Allergy vaccine 1:10 GO  09/04/14- 53 yoF never smoker followed for asthma, allergic rhinitis, complicated by GERD,  Eczema, stress FOLLOWS FOR: Still on Allergy vaccine 1:10 GO and doing well overall; feels the Dymista spray is helping alot. She thinks she might had 2 episodes of asthma exacerbation during the winter but manage them with available tools and feels quite well now.  Review of Systems-see HPI Constitutional:   No weight loss, night sweats,  Fevers, chills,+ fatigue, lassitude. HEENT:   No headaches,  Difficulty swallowing,  Tooth/dental problems,  Sore throat,                No- sneezing, itching, ear ache, +nasal congestion, post nasal drip,  CV:  No chest pain, orthopnea, PND, swelling in lower extremities, anasarca, dizziness, palpitations GI  No heartburn, indigestion, abdominal pain, , Resp:   No coughing up of blood.  No change in color of mucus. No-wheeze  Skin: no rash GU:  MS:  No joint pain or swelling. Marland Kitchen. Psych:  No change in mood or affect.    + depression or anxiety- continues.  No memory loss.  Objective:   Physical Exam General- Alert, Oriented, Affect-appropriate, Distress- none acute.   Skin- rash-none, lesions- none, excoriation-  none Lymphadenopathy- none Head- atraumatic            Eyes- Gross vision intact, PERRLA, conjunctivae clear secretions            Ears- Hearing, canals normal            Nose- Repeated sniffing, no -Septal dev, +mucus bridging, no-polyps, erosion, perforation             Throat- Mallampati III , mucosa clear , drainage- none, tonsils- atrophic Neck- flexible , trachea midline, no stridor , thyroid nl, carotid no bruit Chest - symmetrical excursion , unlabored           Heart/CV- RRR , no murmur , no gallop  , no rub, nl s1 s2                           - JVD- none , edema- none, stasis changes- none, varices- none           Lung-  clear to P&A , wheeze- none,  dullness-none, rub- none              Chest wall-  Abd- Br/ Gen/ Rectal- Not done, not indicated Extrem- cyanosis- none, clubbing, none, atrophy- none, strength- nl Neuro- grossly intact to observation

## 2014-09-04 NOTE — Assessment & Plan Note (Signed)
She feels adequately controlled on allergy vaccine per current protocol

## 2014-12-25 ENCOUNTER — Telehealth: Payer: Self-pay | Admitting: Internal Medicine

## 2014-12-25 NOTE — Telephone Encounter (Signed)
Called [employer] and was advised the person that handles the FMLA is not there and will return on 8.1.16. Spoke to [place] in [employer] and she stated she would call the pt to inform her someone will be calling her on Monday 8.1.16 to handle the issue.   Will forward to Katie to follow up on.

## 2014-12-25 NOTE — Telephone Encounter (Signed)
pt was told by cone that she would loose her job if we didn't change her FMLA paperwork. I sent the call downstaris for her to let [employer] know. She needs her frequency changed. it is set for 1 day every 3 months. She has been out because her asthma has been acting up a lot. it has already been 7 days since she turned it in to get changed and she was only give 14 days total. She will be terminated if this does not get changed soon.

## 2014-12-25 NOTE — Telephone Encounter (Signed)
We do not keep forms on floor; please have Triage contact [employer] to see what the hold up is and let them know of below. They should call the patient and keep patient updated. Thanks.

## 2014-12-29 NOTE — Telephone Encounter (Signed)
[employer] sent forms up today-will forward to CY to fill out so we can return to [employer].

## 2014-12-30 NOTE — Telephone Encounter (Signed)
Form from [employer] completed by CY and just sent downstairs to [employer]. Pt will need to contact them to get update status. Thanks.

## 2014-12-30 NOTE — Telephone Encounter (Signed)
Spoke with pt, aware of forms being sent to [employer].  Pt has number to [employer] to check the status.  Nothing further needed at this time.

## 2014-12-30 NOTE — Telephone Encounter (Signed)
Heidi Tran - has this been completed?   Please advise.

## 2015-01-22 ENCOUNTER — Encounter: Payer: Self-pay | Admitting: Internal Medicine

## 2015-01-26 ENCOUNTER — Telehealth: Payer: Self-pay | Admitting: Internal Medicine

## 2015-01-26 NOTE — Telephone Encounter (Signed)
Called and spoke to pt. Pt states she took her allergy vaccine on 8.29.16 and within 24 hours the pt developed redness, swelling, and warmth at the injection site on her arm and is now from her elbow to her shoulder. Pt states she took a  benadryl with some relief. Pt stated this same exact issue happened the week before. Pt denies SOB, generalized rash, CP/tightness, cough, or f/c/s.   Dr. Maple Hudson please advise. Thanks.

## 2015-01-26 NOTE — Telephone Encounter (Signed)
Called pt., 01/25/15 was the last shot out of those vials. I gave Mrs. Silberman your recs, she understood and is going to start at 0.1 and build as tolerated. (New vials,I'll send 01/27/15.)  I asked her to call us if she had anymore problems. Nothing further was needed.

## 2015-01-26 NOTE — Telephone Encounter (Signed)
Discussed with Heidi Tran who will call patient. Unusual that this apparently happened at the end of current vaccine vials, since new order was just received today. Please call patient to check details. Drop back to 0.1 ml per vial/ week and rebuild gradually as tolerated.

## 2015-01-27 ENCOUNTER — Ambulatory Visit (INDEPENDENT_AMBULATORY_CARE_PROVIDER_SITE_OTHER): Payer: 59

## 2015-01-27 ENCOUNTER — Telehealth: Payer: Self-pay | Admitting: Internal Medicine

## 2015-01-27 DIAGNOSIS — J309 Allergic rhinitis, unspecified: Secondary | ICD-10-CM | POA: Diagnosis not present

## 2015-01-27 NOTE — Telephone Encounter (Signed)
Date Mixed: 01/27/15 Vial: 1 Strength: 1:10 Here/Mail/Pick Up: mail Mixed By: Darlyne Russian

## 2015-03-22 ENCOUNTER — Other Ambulatory Visit: Payer: Self-pay | Admitting: Internal Medicine

## 2015-04-19 ENCOUNTER — Other Ambulatory Visit: Payer: Self-pay | Admitting: Internal Medicine

## 2015-05-24 ENCOUNTER — Telehealth: Payer: Self-pay | Admitting: Family Medicine

## 2015-05-24 NOTE — Telephone Encounter (Signed)
lmom to call and reschedule her appt on 06-03-15 Clelia CroftShaw will be out of the office that day

## 2015-06-03 ENCOUNTER — Encounter: Payer: 59 | Admitting: Family Medicine

## 2015-06-21 MED FILL — ADVAIR 250/50 DISKUS: 250-50 | 30 days supply | Qty: 60 | Fill #3 | Status: TO

## 2015-06-25 DIAGNOSIS — R7309 Other abnormal glucose: Secondary | ICD-10-CM | POA: Diagnosis not present

## 2015-06-25 DIAGNOSIS — E782 Mixed hyperlipidemia: Secondary | ICD-10-CM | POA: Diagnosis not present

## 2015-06-25 DIAGNOSIS — I10 Essential (primary) hypertension: Secondary | ICD-10-CM | POA: Diagnosis not present

## 2015-06-25 MED FILL — CARISOPRODOL 350 MG TABLET: 350 | 30 days supply | Qty: 60 | Fill #0

## 2015-06-28 MED FILL — TRIAMTERENE-HCTZ 37.5-25 MG: 37.5-25 | 90 days supply | Qty: 90 | Fill #0

## 2015-06-29 MED FILL — VASCEPA 0.5 GM CAPSULE: 0.5 | 30 days supply | Qty: 240 | Fill #0

## 2015-07-21 MED FILL — ADVAIR 250/50 DISKUS: 250-50 | 30 days supply | Qty: 60 | Fill #4 | Status: TO

## 2015-07-26 ENCOUNTER — Telehealth: Payer: Self-pay | Admitting: Internal Medicine

## 2015-07-26 NOTE — Telephone Encounter (Signed)
Allergy Serum Extract Date Mixed: 07/26/15 Vial: 1 Strength: 1:10 Here/Mail/Pick Up: mail Mixed By: tbs Last OV: 09/04/14 Pending OV: 09/06/15

## 2015-07-27 DIAGNOSIS — J309 Allergic rhinitis, unspecified: Secondary | ICD-10-CM | POA: Diagnosis not present

## 2015-08-17 DIAGNOSIS — H40013 Open angle with borderline findings, low risk, bilateral: Secondary | ICD-10-CM | POA: Diagnosis not present

## 2015-08-17 DIAGNOSIS — Z83511 Family history of glaucoma: Secondary | ICD-10-CM | POA: Diagnosis not present

## 2015-08-17 DIAGNOSIS — H40053 Ocular hypertension, bilateral: Secondary | ICD-10-CM | POA: Diagnosis not present

## 2015-08-18 ENCOUNTER — Other Ambulatory Visit: Payer: Self-pay | Admitting: Internal Medicine

## 2015-08-18 MED FILL — COMBIVENT RESPIMAT INHAL SP: 20-100 | 30 days supply | Qty: 4 | Fill #0 | Status: TO

## 2015-08-18 MED FILL — ADVAIR 250/50 DISKUS: 250-50 | 30 days supply | Qty: 60 | Fill #5 | Status: TO

## 2015-09-06 ENCOUNTER — Ambulatory Visit: Payer: 59 | Admitting: Internal Medicine

## 2015-09-14 MED FILL — NAPROXEN SODIUM 550 MG TAB: 550 | 6 days supply | Qty: 12 | Fill #0

## 2015-09-14 MED FILL — CHLORHEXIDINE 0.12% RINSE: 0.12 | 15 days supply | Qty: 473 | Fill #0

## 2015-09-16 MED FILL — ADVAIR 250/50 DISKUS: 250-50 | 30 days supply | Qty: 60 | Fill #6 | Status: TO

## 2015-09-16 MED FILL — DYMISTA NASAL SPRAY: 137-50 | 30 days supply | Qty: 23 | Fill #1 | Status: TO

## 2015-10-13 ENCOUNTER — Other Ambulatory Visit: Payer: Self-pay

## 2015-10-13 DIAGNOSIS — I10 Essential (primary) hypertension: Secondary | ICD-10-CM | POA: Diagnosis not present

## 2015-10-13 DIAGNOSIS — Z0000 Encounter for general adult medical examination without abnormal findings: Secondary | ICD-10-CM | POA: Diagnosis not present

## 2015-10-13 DIAGNOSIS — M5440 Lumbago with sciatica, unspecified side: Secondary | ICD-10-CM | POA: Diagnosis not present

## 2015-10-13 DIAGNOSIS — E782 Mixed hyperlipidemia: Secondary | ICD-10-CM | POA: Diagnosis not present

## 2015-10-13 DIAGNOSIS — M62838 Other muscle spasm: Secondary | ICD-10-CM | POA: Diagnosis not present

## 2015-10-13 DIAGNOSIS — Z1231 Encounter for screening mammogram for malignant neoplasm of breast: Secondary | ICD-10-CM

## 2015-10-13 MED FILL — CARISOPRODOL 350 MG TABLET: 350 | 30 days supply | Qty: 60 | Fill #1

## 2015-10-13 MED FILL — TRIAMTERENE/HCTZ 37.5/25 TB: 37.5-25 | 90 days supply | Qty: 90 | Fill #0 | Status: TO

## 2015-10-18 MED FILL — ADVAIR 250/50 DISKUS: 250-50 | 30 days supply | Qty: 60 | Fill #7 | Status: TO

## 2015-11-01 ENCOUNTER — Encounter: Payer: Self-pay | Admitting: Internal Medicine

## 2015-11-01 ENCOUNTER — Ambulatory Visit (INDEPENDENT_AMBULATORY_CARE_PROVIDER_SITE_OTHER): Payer: 59 | Admitting: Internal Medicine

## 2015-11-01 VITALS — BP 120/64 | HR 107 | Ht 62.5 in | Wt 169.4 lb

## 2015-11-01 DIAGNOSIS — J45998 Other asthma: Secondary | ICD-10-CM

## 2015-11-01 DIAGNOSIS — J309 Allergic rhinitis, unspecified: Secondary | ICD-10-CM

## 2015-11-01 DIAGNOSIS — J302 Other seasonal allergic rhinitis: Secondary | ICD-10-CM

## 2015-11-01 DIAGNOSIS — J3089 Other allergic rhinitis: Secondary | ICD-10-CM

## 2015-11-01 MED ORDER — AZITHROMYCIN 250 MG PO TABS: ORAL_TABLET | ORAL | Status: DC

## 2015-11-01 MED ORDER — EPIPEN 2-PAK 0.3 MG/0.3ML IJ SOAJ: INTRAMUSCULAR | Status: DC

## 2015-11-01 MED FILL — AZITHROMYCIN 250 MG TABLET: 250 | 5 days supply | Qty: 6 | Fill #0

## 2015-11-01 NOTE — Patient Instructions (Addendum)
Script sent for Zpak  Stay well hydrated if you may have an infection  Script sent refilling Epipen-   We will give you coupon  Suggest you build back and hold your allergy vaccine at 0.3 ml/ vial/ week  We can continue allergy vaccine through this year as discussed  Please call as needed

## 2015-11-01 NOTE — Assessment & Plan Note (Signed)
Recent acute exacerbation-possibly infection but also weather change. Plan-discussed medications. Z-Pak, hydration

## 2015-11-01 NOTE — Progress Notes (Signed)
Patient ID: Heidi Tran, female    DOB:Heidi Tran 12-31-1960, 55 y.o.   MRN: [id number]  09/01/13- [age] yoF never smoker followed for asthma, allergic rhinitis, complicated by GERD,  Eczema, stress FOLLOWS FOR:  Increased sob since pollen season began over past two weeks.  Believes Zyrtec is causing elevated bp Allergy vaccine 1:10 GO- doing well, continuing to give her own. Increased nasal congestion this spring. Using Zyrtec D but it may raise her blood pressure. We discussed decongestants.  03/03/14- 53 yoF never smoker followed for asthma, allergic rhinitis, complicated by GERD,  Eczema, stress FOLLOWS FOR: Chest tightness and SOB recently-making her tired now.  Allergy vaccine 1:10 GO  09/04/14- 53 yoF never smoker followed for asthma, allergic rhinitis, complicated by GERD,  Eczema, stress FOLLOWS FOR: Still on Allergy vaccine 1:10 GO and doing well overall; feels the Dymista spray is helping alot. She thinks she might had 2 episodes of asthma exacerbation during the winter but manage them with available tools and feels quite well now.  11/01/2015-55 year old female never smoker followed for asthma, allergic rhinitis, complicated by GERD, eczema, stress Allergy Vaccine 1:10 GO FOLLOWS FOR: Pt states she had reactions to vaccine recently about 3 weeks ago so she backed off the dose and now having asthma issues.  She had lost her job after missing too much work. Had large local reaction to allergy vaccine, not a new vial. She dropped back dosing as directed and is rebuilding as tolerated. Increased wheeze and shortness of breath in the last week, using rescue inhaler 2 or 3 times daily. No fever or purulent sputum but maybe some chills.  Review of Systems-see HPI Constitutional:   No weight loss, night sweats,  Fevers, chills,+ fatigue, lassitude. HEENT:   No headaches,  Difficulty swallowing,  Tooth/dental problems,  Sore throat,                No- sneezing, itching, ear ache, +nasal congestion,  post nasal drip,  CV:  No chest pain, orthopnea, PND, swelling in lower extremities, anasarca, dizziness, palpitations GI  No heartburn, indigestion, abdominal pain, , Resp:   No coughing up of blood.  No change in color of mucus. +  Skin: no rash GU:  MS:  No joint pain or swelling. Marland Kitchen. Psych:  No change in mood or affect.    + depression or anxiety- continues.  No memory loss.  Objective:   Physical Exam General- Alert, Oriented, Affect-appropriate, Distress- none acute.   Skin- rash-none, lesions- none, excoriation- none Lymphadenopathy- none Head- atraumatic            Eyes- Gross vision intact, PERRLA, conjunctivae clear secretions            Ears- Hearing, canals normal            Nose- Repeated sniffing, no -Septal dev, +mucus bridging, no-polyps, erosion, perforation             Throat- Mallampati III , mucosa clear , drainage- none, tonsils- atrophic Neck- flexible , trachea midline, no stridor , thyroid nl, carotid no bruit Chest - symmetrical excursion , unlabored           Heart/CV- RRR , no murmur , no gallop  , no rub, nl s1 s2                           - JVD- none , edema- none, stasis changes- none, varices- none  Lung-  clear to P&A , wheeze- none,  dullness-none, rub- none              Chest wall-  Abd- Br/ Gen/ Rectal- Not done, not indicated Extrem- cyanosis- none, clubbing, none, atrophy- none, strength- nl Neuro- grossly intact to observation

## 2015-11-01 NOTE — Assessment & Plan Note (Signed)
Recent exacerbation. Plan-refilled EpiPen. Rebuild allergy vaccine to 0.3 ML/vial/week

## 2015-11-24 ENCOUNTER — Ambulatory Visit: Payer: 59 | Admitting: Internal Medicine

## 2015-12-02 ENCOUNTER — Ambulatory Visit
Admission: RE | Admit: 2015-12-02 | Discharge: 2015-12-02 | Disposition: A | Discharge status: Home / Self Care | Payer: No Typology Code available for payment source | Source: Ambulatory Visit

## 2015-12-02 DIAGNOSIS — Z1231 Encounter for screening mammogram for malignant neoplasm of breast: Secondary | ICD-10-CM

## 2016-01-20 ENCOUNTER — Ambulatory Visit: Payer: Self-pay | Attending: Internal Medicine

## 2016-01-27 ENCOUNTER — Ambulatory Visit: Payer: Self-pay

## 2016-02-07 ENCOUNTER — Telehealth: Payer: Self-pay | Admitting: Internal Medicine

## 2016-02-07 MED ORDER — FLUTICASONE-SALMETEROL 113-14 MCG/ACT IN AEPB: 1.0000 | INHALATION_SPRAY | Freq: Two times a day (BID) | RESPIRATORY_TRACT | 5 refills | Status: DC

## 2016-02-07 NOTE — Telephone Encounter (Signed)
Spoke with pt and gave recommendations. She would like printed rx for AirDuo so that [facility] check prices [facility] Rx printed and left for CY to sign. Pt advised it will be at front desk for pick up tomorrow. Nothing further needed.

## 2016-02-07 NOTE — Telephone Encounter (Signed)
Called spoke with pt. She states that she is currently out of Advair 250. She states she currently does not have a job or insurance and Advair is to expensive for her at this time. [facility] cheaper inhaler. I explained to her that our office is currently out of Advair samples at this time and that I would send the message to CY. She voiced understanding and had no further questions.  CY please advise  Allergies  Allergen Reactions  . Statins     Leg cramps  . Sulfonamide Derivatives     REACTION: rash     Current Outpatient Prescriptions:  .  ADVAIR DISKUS 250-50 MCG/DOSE AEPB, INHALE 1 PUFF INTO THE LUNGS 2 TIMES DAILY *RINSE MOUTH AFTER USE*, Disp: 60 each, Rfl: PRN .  albuterol (PROVENTIL) (2.5 MG/3ML) 0.083% nebulizer solution, Take 3 mLs (2.5 mg total) by nebulization every 6 (six) hours as needed., Disp: 75 mL, Rfl: prn .  azithromycin (ZITHROMAX) 250 MG tablet, 2 today then one daily, Disp: 6 tablet, Rfl: 0 .  cetirizine (ZYRTEC) 10 MG tablet, Take 10 mg by mouth daily., Disp: , Rfl:  .  cetirizine-pseudoephedrine (ZYRTEC-D) 5-120 MG per tablet, Take 1 tablet by mouth daily. Alternates with Zyrtec 10 mg as needed, Disp: , Rfl:  .  COMBIVENT RESPIMAT 20-100 MCG/ACT AERS respimat, INHALE 1 PUFF BY MOUTH EVERY 4 HOURS AS NEEDED, Disp: 36 g, Rfl: PRN .  DYMISTA 137-50 MCG/ACT SUSP, PLACE 2 SPRAYS INTO BOTH NOSTRILS AT BEDTIME., Disp: 23 g, Rfl: PRN .  EPIPEN 2-PAK 0.3 MG/0.3ML SOAJ injection, Inject into thigh for severe allergic reaction, Disp: 1 Device, Rfl: PRN .  famotidine (PEPCID) 20 MG tablet, 1 twice daily before meals, Disp: 60 tablet, Rfl: prn .  fish oil-omega-3 fatty acids 1000 MG capsule, Take 1 g by mouth daily.  , Disp: , Rfl:  .  Multiple Vitamin (MULTIVITAMIN) capsule, Take 1 capsule by mouth daily.  , Disp: , Rfl:  .  naproxen sodium (ANAPROX) 220 MG tablet, Take 440 mg by mouth daily. , Disp: , Rfl:  .  NON FORMULARY,  Allergy vaccines 1:10 weekly GO, Disp: , Rfl:  .  triamterene-hydrochlorothiazide (MAXZIDE-25) 37.5-25 MG per tablet, Take 1 tablet by mouth daily.  , Disp: , Rfl:

## 2016-02-07 NOTE — Telephone Encounter (Signed)
She might be able to get AirDuo inhaler  (And we might have a coupon).  Inhale 2 puffs then rinse mouth, twice daily, ref x 12. I don't know if [facility] this or Advair.

## 2016-03-02 ENCOUNTER — Telehealth: Payer: Self-pay | Admitting: Internal Medicine

## 2016-03-02 NOTE — Telephone Encounter (Signed)
Per CY-there are no documented reasoning's as to why patient needs a [employer] Placard. We can mail the form back to the patient or she can come by and pick up.    Please let us know which patient would prefer. Thanks.

## 2016-03-03 NOTE — Telephone Encounter (Signed)
I have placed in mail. Nothing more needed at this time.

## 2016-03-03 NOTE — Telephone Encounter (Signed)
Pt aware, requests that her form be mailed to her.  Will send to Katie to mail placard back.

## 2016-11-01 ENCOUNTER — Ambulatory Visit: Payer: Self-pay | Admitting: Internal Medicine

## 2016-11-02 ENCOUNTER — Ambulatory Visit: Payer: Self-pay | Admitting: Internal Medicine

## 2016-12-27 ENCOUNTER — Other Ambulatory Visit: Payer: Self-pay | Admitting: Internal Medicine

## 2017-01-02 ENCOUNTER — Telehealth: Payer: Self-pay | Admitting: Internal Medicine

## 2017-01-02 NOTE — Telephone Encounter (Signed)
Spoke with patient-states she was told by pharmacy that her Airduo inhaler was suppose to be changed but nothing new sent to them. Spoke with pharmacy-medication is not in stock and no changes were being made-patient has to wait for med to be in stock.    Pt was also informed she is past new for OV with CY-appt made for Monday 01/15/17 at 4:30pm.    Pharmacy has ordered Rx and will keep in touch with patient. Pt is aware. Nothing more needed at this time.

## 2017-01-15 ENCOUNTER — Encounter: Payer: Self-pay | Admitting: Internal Medicine

## 2017-01-15 ENCOUNTER — Ambulatory Visit (INDEPENDENT_AMBULATORY_CARE_PROVIDER_SITE_OTHER): Payer: Self-pay | Admitting: Internal Medicine

## 2017-01-15 DIAGNOSIS — J302 Other seasonal allergic rhinitis: Secondary | ICD-10-CM

## 2017-01-15 DIAGNOSIS — J45998 Other asthma: Secondary | ICD-10-CM

## 2017-01-15 DIAGNOSIS — J3089 Other allergic rhinitis: Secondary | ICD-10-CM

## 2017-01-15 DIAGNOSIS — K219 Gastro-esophageal reflux disease without esophagitis: Secondary | ICD-10-CM

## 2017-01-15 MED ORDER — AMOXICILLIN 500 MG PO TABS
500.0000 mg | ORAL_TABLET | Freq: Two times a day (BID) | ORAL | 0 refills | Status: DC
Start: 2017-01-15 — End: 2017-10-15

## 2017-01-15 NOTE — Progress Notes (Signed)
Patient ID: Heidi Tran, female    DOB: 03/13/1961, 56 y.o.   MRN: [id number]  HPI female never smoker followed for asthma, allergic rhinitis, complicated by GERD, eczema, stress PFT 01/25/11-WNL-FVC 2.75/85%, FEV1 2.16/88%, ratio 0.78, FEF 25-75% 2.56/90%, TLC 85%, DLCO 93% -----------------------------------------------------------------------------------------------------------------  11/01/2015-56 year old female never smoker followed for asthma, allergic rhinitis, complicated by GERD, eczema, stress Allergy Vaccine 1:10 GO FOLLOWS FOR: Pt states she had reactions to vaccine recently about 3 weeks ago so she backed off the dose and now having asthma issues.  She had lost her job after missing too much work. Had large local reaction to allergy vaccine, not a new vial. She dropped back dosing as directed and is rebuilding as tolerated. Increased wheeze and shortness of breath in the last week, using rescue inhaler 2 or 3 times daily. No fever or purulent sputum but maybe some chills.  01/15/17- 56 year old female never smoker followed for asthma, allergic rhinitis, complicated by GERD, eczema, stress 66yr follow up for asthma and allergies. States breathing has stayed the same. Still bothered by the humidity and heat.  Has not found a job. Occasional wheezing, especially sometimes at night. Uses rescue inhaler once or twice a week. Using generic Advair HFA 113/14, Flonase, Zyrtec-D. Admits occasional reflux which we discussed. Some exertional chest tightness and occasional painful twinges she associates with asthma. Concerned she may have a sinus infection-recent retro-orbital pressure, frontal headache, increase nasal congestion. Saline nasal rinse brings up thick mucus.  Review of Systems-see HPI  + = pos Constitutional:   No weight loss, night sweats,  Fevers, chills,+ fatigue, lassitude. HEENT:   No headaches,  Difficulty swallowing,  Tooth/dental problems,  Sore throat,   No- sneezing, itching, ear ache, +nasal congestion, post nasal drip,  CV:  No chest pain, orthopnea, PND, swelling in lower extremities, anasarca, dizziness, palpitations GI  No heartburn, indigestion, abdominal pain, , Resp:   No coughing up of blood.  No change in color of mucus.   Skin: no rash GU:  MS:  No joint pain or swelling. Marland Kitchen Psych:  No change in mood or affect.    + depression or anxiety- continues.  No memory loss.  Objective:   Physical Exam General- Alert, Oriented, Affect-appropriate, Distress- none acute.  + Overweight Skin- rash-none, lesions- none, excoriation- none Lymphadenopathy- none Head- atraumatic            Eyes- Gross vision intact, PERRLA, conjunctivae clear secretions            Ears- Hearing, canals normal            Nose- Repeated sniffing, no -Septal dev, +mucus bridging, no-polyps, erosion, perforation             Throat- Mallampati III , mucosa clear , drainage- none, tonsils- atrophic Neck- flexible , trachea midline, no stridor , thyroid nl, carotid no bruit Chest - symmetrical excursion , unlabored           Heart/CV- RRR , no murmur , no gallop  , no rub, nl s1 s2                           - JVD- none , edema- none, stasis changes- none, varices- none           Lung-  clear to P&A , wheeze- none,  dullness-none, rub- none              Chest wall-  Abd- Br/  Gen/ Rectal- Not done, not indicated Extrem- cyanosis- none, clubbing, none, atrophy- none, strength- nl Neuro- grossly intact to observation

## 2017-01-15 NOTE — Assessment & Plan Note (Signed)
We discussed asthma and especially with wheezing sometimes at night, I reviewed reflux precautions. We reviewed her medications. No need refill currently.

## 2017-01-15 NOTE — Patient Instructions (Addendum)
Script sent for amoxacillin for infection  Ok to continue present meds. You can check with drug store, but albuterol inhaler may be a cheaper rescue inhaler for you than Combivent  Please call if we can help

## 2017-01-15 NOTE — Assessment & Plan Note (Signed)
Emphasis on reflux precautions as discussed

## 2017-01-15 NOTE — Assessment & Plan Note (Signed)
We will treat this as possible early maxillary sinus infection Plan-amoxicillin. Continue saline nasal rinse.

## 2017-07-03 ENCOUNTER — Ambulatory Visit (INDEPENDENT_AMBULATORY_CARE_PROVIDER_SITE_OTHER): Payer: Self-pay | Admitting: Family Medicine

## 2017-07-03 ENCOUNTER — Encounter: Payer: Self-pay | Admitting: Family Medicine

## 2017-07-03 VITALS — BP 154/86 | HR 80 | Temp 98.8°F | Resp 16 | Ht 62.5 in | Wt 168.0 lb

## 2017-07-03 DIAGNOSIS — Z1239 Encounter for other screening for malignant neoplasm of breast: Secondary | ICD-10-CM

## 2017-07-03 DIAGNOSIS — Z131 Encounter for screening for diabetes mellitus: Secondary | ICD-10-CM

## 2017-07-03 DIAGNOSIS — Z1231 Encounter for screening mammogram for malignant neoplasm of breast: Secondary | ICD-10-CM

## 2017-07-03 DIAGNOSIS — Z1321 Encounter for screening for nutritional disorder: Secondary | ICD-10-CM

## 2017-07-03 DIAGNOSIS — Z13220 Encounter for screening for lipoid disorders: Secondary | ICD-10-CM

## 2017-07-03 DIAGNOSIS — Z1329 Encounter for screening for other suspected endocrine disorder: Secondary | ICD-10-CM

## 2017-07-03 DIAGNOSIS — Z136 Encounter for screening for cardiovascular disorders: Secondary | ICD-10-CM

## 2017-07-03 DIAGNOSIS — I10 Essential (primary) hypertension: Secondary | ICD-10-CM

## 2017-07-03 LAB — POCT URINALYSIS DIP (DEVICE)
Bilirubin Urine: NEGATIVE
Glucose, UA: NEGATIVE mg/dL
HGB URINE DIPSTICK: NEGATIVE
Ketones, ur: NEGATIVE mg/dL
Leukocytes, UA: NEGATIVE
NITRITE: NEGATIVE
PROTEIN: 30 mg/dL — AB
SPECIFIC GRAVITY, URINE: 1.025 (ref 1.005–1.030)
UROBILINOGEN UA: 0.2 mg/dL (ref 0.0–1.0)
pH: 6 (ref 5.0–8.0)

## 2017-07-03 LAB — POCT GLYCOSYLATED HEMOGLOBIN (HGB A1C): Hemoglobin A1C: 5.6

## 2017-07-03 MED ORDER — TRIAMTERENE-HCTZ 37.5-25 MG PO TABS: 1.0000 | ORAL_TABLET | Freq: Every day | ORAL | 1 refills | Status: DC

## 2017-07-03 MED FILL — ?TRIAMTERENE/HCTZ 37.5/25TB: 37.5-25 | 30 days supply | Qty: 30 | Fill #0

## 2017-07-03 NOTE — Progress Notes (Signed)
Patient ID: Heidi Tran Solan, female    DOB: 03-16-1961, 57 y.o.   MRN: [id number]  PCP: Bing NeighborsHarris, Sheryll Dymek S, FNP  Chief Complaint  Patient presents with  . Establish Care    Subjective:  HPI Heidi Tran Scearce is a 57 y.o. female with a history hypertension, hyperlipidemia,  presents for evaluation of chronic conditions and to establish care. Agustin CreeDarlene has been lost to primary care follow-up due to losing insurance after being forced to retire. She has been stretching her medications by taking less than prescribed to prevent medications from running out. 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 Prior to employment physical, Agustin CreeDarlene had not checked her blood pressure. She denies experiencing any symptoms of headaches, dizziness, chest pain, edema, or new weakness. Reports efforts to manage chronic conditions with dietary changes-low fat and sodium regimen. She is overdue for breast cancer screening. She has a positive family history for breast cancer (mother) and father suffered from diabetes. Agustin CreeDarlene has a positive depression screen today. She reports depression is due to current financial situation and unemployment. She has almost exhausted her retirement and savings, this is causing significant anxiety. Denies suicidal thoughts, homicidal ideations, or auditory hallucinations. No prior history of depression or anxiety.  Social History   Socioeconomic History  . Marital status: Single    Spouse name: Not on file  . Number of children: Not on file  . Years of education: Not on file  . Highest education level: Not on file  Social Needs  . Financial resource strain: Not on file  . Food insecurity - worry: Not on file  . Food insecurity - inability: Not on file  . Transportation needs - medical: Not on file  . Transportation  needs - non-medical: Not on file  Occupational History  . 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 Smoking status: Never Smoker  . Smokeless tobacco: Never Used  Substance and Sexual Activity  . Alcohol use: No  . Drug use: Not on file  . Sexual activity: Not on file  Other Topics Concern  . Not on file  Social History Narrative  . Not on file    Family History  Problem Relation Age of Onset  . Cancer Mother   . Prostate cancer Father   . Heart disease Father    Review of Systems Constitutional: Negative for fever, chills, diaphoresis, activity change, appetite change and fatigue. HENT: Negative for ear pain, nosebleeds, congestion, facial swelling, rhinorrhea, neck pain, neck stiffness and ear discharge.  Eyes: Negative for pain, discharge, redness, itching and visual disturbance. Respiratory: Negative for cough, choking, chest tightness, shortness of breath, wheezing and stridor.  Cardiovascular: Negative for chest pain, palpitations and leg swelling. Gastrointestinal: Negative for abdominal distention. Genitourinary: Negative for dysuria, urgency, frequency, hematuria, flank pain, decreased urine volume, difficulty urinating and dyspareunia.  Musculoskeletal: Negative for back pain, joint swelling, arthralgia and gait problem. Neurological: Negative for dizziness, tremors, seizures, syncope, facial asymmetry, speech difficulty, weakness, light-headedness, numbness and headaches.  Hematological: Negative for adenopathy. Does not bruise/bleed easily. Psychiatric/Behavioral: Negative for hallucinations, behavioral problems, confusion, dysphoric mood, decreased concentration and agitation.   Patient Active Problem List   Diagnosis Date Noted  . GERD 02/11/2010  . HYPERTENSION 07/26/2008  . Seasonal and perennial allergic rhinitis 10/11/2007  . 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 ECZEMA  10/11/2007    Allergies  Allergen Reactions  . Statins Other (See Comments)    Leg  cramps  . Sulfonamide Derivatives     REACTION: rash    Prior to Admission medications   Medication Sig Start Date End Date Taking? Authorizing Provider  albuterol (PROVENTIL) (2.5 MG/3ML) 0.083% nebulizer solution Take 3 mLs (2.5 mg total) by nebulization every 6 (six) hours as needed. 03/03/14  Yes Young, Joni Fears D, MD  carisoprodol (SOMA) 350 MG tablet Take 350 mg by mouth 4 (four) times daily as needed for muscle spasms.   Yes [provider]  cetirizine-pseudoephedrine (ZYRTEC-D) 5-120 MG per tablet Take 1 tablet by mouth daily. Alternates with Zyrtec 10 mg as needed   Yes [provider]  COMBIVENT RESPIMAT 20-100 MCG/ACT AERS respimat INHALE 1 PUFF BY MOUTH EVERY 4 HOURS AS NEEDED 08/18/15  Yes Jetty Duhamel D, MD  famotidine (PEPCID) 20 MG tablet 1 twice daily before meals 03/03/14  Yes Young, Clinton D, MD  fish oil-omega-3 fatty acids 1000 MG capsule Take 1 g by mouth daily.     Yes [provider]  Fluticasone-Salmeterol 113-14 MCG/ACT AEPB INHALE 1 PUFF INTO THE LUNGS TWICE DAILY 12/27/16  Yes Young, Joni Fears D, MD  Multiple Vitamin (MULTIVITAMIN) capsule Take 1 capsule by mouth daily.     Yes [provider]  naproxen sodium (ANAPROX) 220 MG tablet Take 440 mg by mouth daily.    Yes [provider]  triamterene-hydrochlorothiazide (MAXZIDE-25) 37.5-25 MG per tablet Take 1 tablet by mouth daily.     Yes [provider]  amoxicillin (AMOXIL) 500 MG tablet Take 1 tablet (500 mg total) by mouth 2 (two) times daily. Patient not taking: Reported on 07/03/2017 01/15/17   Waymon Budge, MD  cetirizine (ZYRTEC) 10 MG tablet Take 10 mg by mouth daily.    [provider]  NON FORMULARY Allergy vaccines 1:10 weekly GO    [provider]    Past Medical, Surgical Family and Social History reviewed and updated.    Objective:   Today's Vitals   07/03/17 0936 07/03/17 0944 07/03/17 1016  BP: (!) 160/90 (!) 160/84 (!) 154/86   Pulse: 80    Resp: 16    Temp: 98.8 F (37.1 C)    TempSrc: Oral    SpO2: 98%    Weight: 168 lb (76.2 kg)    Height: 5' 2.5" (1.588 m)      Wt Readings from Last 3 Encounters:  07/03/17 168 lb (76.2 kg)  01/15/17 173 lb (78.5 kg)  11/01/15 169 lb 6.4 oz (76.8 kg)    Physical Exam Constitutional: Negative for fever, chills, diaphoresis, activity change, appetite change and fatigue. HENT: Negative for ear pain, nosebleeds, congestion, facial swelling, rhinorrhea, neck pain, neck stiffness and ear discharge.  Eyes: Negative for pain, discharge, redness, itching and visual disturbance. Respiratory: Negative for cough, choking, chest tightness, shortness of breath, wheezing and stridor.  Cardiovascular: Negative for chest pain, palpitations and leg swelling. Gastrointestinal: Negative for abdominal distention. Genitourinary: Negative for dysuria, urgency, frequency, hematuria, flank pain, decreased urine volume, difficulty urinating and dyspareunia.  Musculoskeletal: Negative for back pain, joint swelling, arthralgia and gait problem. Neurological: Negative for dizziness, tremors, seizures, syncope, facial asymmetry, speech difficulty, weakness, light-headedness, numbness and headaches.  Hematological: Negative for adenopathy. Does not bruise/bleed easily. Psychiatric/Behavioral: Negative for hallucinations, behavioral problems, confusion, dysphoric mood, decreased concentration and agitation.    Assessment & Plan:  1. Essential hypertension, uncontrolled today. Will resume  patient on previously dose and same medication. She will return in 2 weeks for BP check, and medication will be titrated to achieve a BP of less than 140/90.  2. Screening for diabetes mellitus, A1C today 5.6.  3. Screening for hyperlipidemia, will check a fasting Lipid Panel and continue patient in prescribed lopid and fish oil supplements.   4. Encounter for cardiovascular conditions, will obtain an EKG  12-Lead  5. Thyroid disorder screen, check Thyroid Panel With TSH  6. Screening breast examination- MM SCREENING BREAST TOMO BILATERAL  7. Encounter for vitamin deficiency screening- VITAMIN D 25 Hydroxy (Vit-D Deficiency, Fractures)   Meds ordered this encounter  Medications  . triamterene-hydrochlorothiazide (MAXZIDE-25) 37.5-25 MG tablet    Sig: Take 1 tablet by mouth daily.    Dispense:  90 tablet    Refill:  1    Order Specific Question:   Supervising Provider    Answer:   Quentin Angst [id number]     Orders Placed This Encounter  Procedures  . MM SCREENING BREAST TOMO BILATERAL  . Comprehensive metabolic panel  . Lipid Panel  . Thyroid Panel With TSH  . CBC with Differential  . VITAMIN D 25 Hydroxy (Vit-D Deficiency, Fractures)  . POCT glycosylated hemoglobin (Hb A1C)  . POCT urinalysis dip (device)  . EKG 12-Lead     RTC: 2 weeks Hypertension and Rectal Exam    Godfrey Pick. Tiburcio Pea, MSN, FNP-C [facility]  [address] [place], [place] [address] [phone]

## 2017-07-03 NOTE — Patient Instructions (Addendum)
To schedule your breast exam, please call [facility] Phone: [phone]  As discussed continue to monitor your blood pressure if any of her readings are greater than 150/90 prior to your follow-up please contact me here at the office.   [facility]  You will return in 2 weeks for hypertension follow-up.     Hypertension Hypertension, commonly called high blood pressure, is when the force of blood pumping through the arteries is too strong. The arteries are the blood vessels that carry blood from the heart throughout the body. Hypertension forces the heart to work harder to pump blood and may cause arteries to become narrow or stiff. Having untreated or uncontrolled hypertension can cause heart attacks, strokes, kidney disease, and other problems. A blood pressure reading consists of a higher number over a lower number. Ideally, your blood pressure should be below 120/80. The first ("top") number is called the systolic pressure. It is a measure of the pressure in your arteries as your heart beats. The second ("bottom") number is called the diastolic pressure. It is a measure of the pressure in your arteries as the heart relaxes. What are the causes? The cause of this condition is not known. What increases the risk? Some risk factors for high blood pressure are under your control. Others are not. Factors you can change  Smoking.  Having type 2 diabetes mellitus, high cholesterol, or both.  Not getting enough exercise or physical activity.  Being overweight.  Having too much fat, sugar, calories, or salt (sodium) in your diet.  Drinking too much alcohol. Factors that are difficult or impossible to change  Having chronic kidney disease.  Having a family history of high blood pressure.  Age. Risk increases with age.  Race. You may be at higher risk if you are African-American.  Gender. Men are at higher  risk than women before age 47. After age 59, women are at higher risk than men.  Having obstructive sleep apnea.  Stress. What are the signs or symptoms? Extremely high blood pressure (hypertensive crisis) may cause:  Headache.  Anxiety.  Shortness of breath.  Nosebleed.  Nausea and vomiting.  Severe chest pain.  Jerky movements you cannot control (seizures).  How is this diagnosed? This condition is diagnosed by measuring your blood pressure while you are seated, with your arm resting on a surface. The cuff of the blood pressure monitor will be placed directly against the skin of your upper arm at the level of your heart. It should be measured at least twice using the same arm. Certain conditions can cause a difference in blood pressure between your right and left arms. Certain factors can cause blood pressure readings to be lower or higher than normal (elevated) for a short period of time:  When your blood pressure is higher when you are in a health care provider's office than when you are at home, this is called white coat hypertension. Most people with this condition do not need medicines.  When your blood pressure is higher at home than when you are in a health care provider's office, this is called masked hypertension. Most people with this condition may need medicines to control blood pressure.  If you have a high blood pressure reading during one visit or you have normal blood pressure with other risk factors:  You may be asked to return on a different day to have your blood pressure checked again.  You  may be asked to monitor your blood pressure at home for 1 week or longer.  If you are diagnosed with hypertension, you may have other blood or imaging tests to help your health care provider understand your overall risk for other conditions. How is this treated? This condition is treated by making healthy lifestyle changes, such as eating healthy foods, exercising more,  and reducing your alcohol intake. Your health care provider may prescribe medicine if lifestyle changes are not enough to get your blood pressure under control, and if:  Your systolic blood pressure is above 130.  Your diastolic blood pressure is above 80.  Your personal target blood pressure may vary depending on your medical conditions, your age, and other factors. Follow these instructions at home: Eating and drinking  Eat a diet that is high in fiber and potassium, and low in sodium, added sugar, and fat. An example eating plan is called the DASH (Dietary Approaches to Stop Hypertension) diet. To eat this way: ? Eat plenty of fresh fruits and vegetables. Try to fill half of your plate at each meal with fruits and vegetables. ? Eat whole grains, such as whole wheat pasta, brown rice, or whole grain bread. Fill about one quarter of your plate with whole grains. ? Eat or drink low-fat dairy products, such as skim milk or low-fat yogurt. ? Avoid fatty cuts of meat, processed or cured meats, and poultry with skin. Fill about one quarter of your plate with lean proteins, such as fish, chicken without skin, beans, eggs, and tofu. ? Avoid premade and processed foods. These tend to be higher in sodium, added sugar, and fat.  Reduce your daily sodium intake. Most people with hypertension should eat less than 1,500 mg of sodium a day.  Limit alcohol intake to no more than 1 drink a day for nonpregnant women and 2 drinks a day for men. One drink equals 12 oz of beer, 5 oz of wine, or 1 oz of hard liquor. Lifestyle  Work with your health care provider to maintain a healthy body weight or to lose weight. Ask what an ideal weight is for you.  Get at least 30 minutes of exercise that causes your heart to beat faster (aerobic exercise) most days of the week. Activities may include walking, swimming, or biking.  Include exercise to strengthen your muscles (resistance exercise), such as pilates or  lifting weights, as part of your weekly exercise routine. Try to do these types of exercises for 30 minutes at least 3 days a week.  Do not use any products that contain nicotine or tobacco, such as cigarettes and e-cigarettes. If you need help quitting, ask your health care provider.  Monitor your blood pressure at home as told by your health care provider.  Keep all follow-up visits as told by your health care provider. This is important. Medicines  Take over-the-counter and prescription medicines only as told by your health care provider. Follow directions carefully. Blood pressure medicines must be taken as prescribed.  Do not skip doses of blood pressure medicine. Doing this puts you at risk for problems and can make the medicine less effective.  Ask your health care provider about side effects or reactions to medicines that you should watch for. Contact a health care provider if:  You think you are having a reaction to a medicine you are taking.  You have headaches that keep coming back (recurring).  You feel dizzy.  You have swelling in your ankles.  You  have trouble with your vision. Get help right away if:  You develop a severe headache or confusion.  You have unusual weakness or numbness.  You feel faint.  You have severe pain in your chest or abdomen.  You vomit repeatedly.  You have trouble breathing. Summary  Hypertension is when the force of blood pumping through your arteries is too strong. If this condition is not controlled, it may put you at risk for serious complications.  Your personal target blood pressure may vary depending on your medical conditions, your age, and other factors. For most people, a normal blood pressure is less than 120/80.  Hypertension is treated with lifestyle changes, medicines, or a combination of both. Lifestyle changes include weight loss, eating a healthy, low-sodium diet, exercising more, and limiting alcohol. This  information is not intended to replace advice given to you by your health care provider. Make sure you discuss any questions you have with your health care provider. Document Released: 05/15/2005 Document Revised: 04/12/2016 Document Reviewed: 04/12/2016 Elsevier Interactive Patient Education  2018 [place] and Stress Management Stress is a normal reaction to life events. It is what you feel when life demands more than you are used to or more than you can handle. Some stress can be useful. For example, the stress reaction can help you catch the last bus of the day, study for a test, or meet a deadline at work. But stress that occurs too often or for too long can cause problems. It can affect your emotional health and interfere with relationships and normal daily activities. Too much stress can weaken your immune system and increase your risk for physical illness. If you already have a medical problem, stress can make it worse. What are the causes? All sorts of life events may cause stress. An event that causes stress for one person may not be stressful for another person. Major life events commonly cause stress. These may be positive or negative. Examples include losing your job, moving into a new home, getting married, having a baby, or losing a loved one. Less obvious life events may also cause stress, especially if they occur day after day or in combination. Examples include working long hours, driving in traffic, caring for children, being in debt, or being in a difficult relationship. What are the signs or symptoms? Stress may cause emotional symptoms including, the following:  Anxiety. This is feeling worried, afraid, on edge, overwhelmed, or out of control.  Anger. This is feeling irritated or impatient.  Depression. This is feeling sad, down, helpless, or guilty.  Difficulty focusing, remembering, or making decisions.  Stress may cause physical symptoms, including the  following:  Aches and pains. These may affect your head, neck, back, stomach, or other areas of your body.  Tight muscles or clenched jaw.  Low energy or trouble sleeping.  Stress may cause unhealthy behaviors, including the following:  Eating to feel better (overeating) or skipping meals.  Sleeping too little, too much, or both.  Working too much or putting off tasks (procrastination).  Smoking, drinking alcohol, or using drugs to feel better.  How is this diagnosed? Stress is diagnosed through an assessment by your health care provider. Your health care provider will ask questions about your symptoms and any stressful life events.Your health care provider will also ask about your medical history and may order blood tests or other tests. Certain medical conditions and medicine can cause physical symptoms similar to stress. Mental illness  can cause emotional symptoms and unhealthy behaviors similar to stress. Your health care provider may refer you to a mental health professional for further evaluation. How is this treated? Stress management is the recommended treatment for stress.The goals of stress management are reducing stressful life events and coping with stress in healthy ways. Techniques for reducing stressful life events include the following:  Stress identification. Self-monitor for stress and identify what causes stress for you. These skills may help you to avoid some stressful events.  Time management. Set your priorities, keep a calendar of events, and learn to say "no." These tools can help you avoid making too many commitments.  Techniques for coping with stress include the following:  Rethinking the problem. Try to think realistically about stressful events rather than ignoring them or overreacting. Try to find the positives in a stressful situation rather than focusing on the negatives.  Exercise. Physical exercise can release both physical and emotional tension.  The key is to find a form of exercise you enjoy and do it regularly.  Relaxation techniques. These relax the body and mind. Examples include yoga, meditation, tai chi, biofeedback, deep breathing, progressive muscle relaxation, listening to music, being out in nature, journaling, and other hobbies. Again, the key is to find one or more that you enjoy and can do regularly.  Healthy lifestyle. Eat a balanced diet, get plenty of sleep, and do not smoke. Avoid using alcohol or drugs to relax.  Strong support network. Spend time with family, friends, or other people you enjoy being around.Express your feelings and talk things over with someone you trust.  Counseling or talktherapy with a mental health professional may be helpful if you are having difficulty managing stress on your own. Medicine is typically not recommended for the treatment of stress.Talk to your health care provider if you think you need medicine for symptoms of stress. Follow these instructions at home:  Keep all follow-up visits as directed by your health care provider.  Take all medicines as directed by your health care provider. Contact a health care provider if:  Your symptoms get worse or you start having new symptoms.  You feel overwhelmed by your problems and can no longer manage them on your own. Get help right away if:  You feel like hurting yourself or someone else. This information is not intended to replace advice given to you by your health care provider. Make sure you discuss any questions you have with your health care provider. Document Released: 11/08/2000 Document Revised: 10/21/2015 Document Reviewed: 01/07/2013 Elsevier Interactive Patient Education  2017 [employer].

## 2017-07-04 ENCOUNTER — Telehealth: Payer: Self-pay | Admitting: Family Medicine

## 2017-07-04 LAB — COMPREHENSIVE METABOLIC PANEL
A/G RATIO: 1.9 (ref 1.2–2.2)
ALT: 12 IU/L (ref 0–32)
AST: 11 IU/L (ref 0–40)
Albumin: 4.6 g/dL (ref 3.5–5.5)
Alkaline Phosphatase: 118 IU/L — ABNORMAL HIGH (ref 39–117)
BUN/Creatinine Ratio: 14 (ref 9–23)
BUN: 11 mg/dL (ref 6–24)
Bilirubin Total: 0.3 mg/dL (ref 0.0–1.2)
CO2: 22 mmol/L (ref 20–29)
Calcium: 9.9 mg/dL (ref 8.7–10.2)
Chloride: 103 mmol/L (ref 96–106)
Creatinine, Ser: 0.77 mg/dL (ref 0.57–1.00)
GFR calc Af Amer: 98 mL/min/{1.73_m2} (ref >59)
GFR, EST NON AFRICAN AMERICAN: 85 mL/min/{1.73_m2} (ref >59)
GLOBULIN, TOTAL: 2.4 g/dL (ref 1.5–4.5)
Glucose: 95 mg/dL (ref 65–99)
POTASSIUM: 4.1 mmol/L (ref 3.5–5.2)
SODIUM: 142 mmol/L (ref 134–144)
Total Protein: 7 g/dL (ref 6.0–8.5)

## 2017-07-04 LAB — CBC WITH DIFFERENTIAL/PLATELET
Basophils Absolute: 0 10*3/uL (ref 0.0–0.2)
Basos: 1 %
EOS (ABSOLUTE): 0.1 10*3/uL (ref 0.0–0.4)
Eos: 2 %
Hematocrit: 39.1 % (ref 34.0–46.6)
Hemoglobin: 13.2 g/dL (ref 11.1–15.9)
Immature Grans (Abs): 0 10*3/uL (ref 0.0–0.1)
Immature Granulocytes: 0 %
LYMPHS ABS: 2.2 10*3/uL (ref 0.7–3.1)
Lymphs: 45 %
MCH: 27.2 pg (ref 26.6–33.0)
MCHC: 33.8 g/dL (ref 31.5–35.7)
MCV: 81 fL (ref 79–97)
Monocytes Absolute: 0.3 10*3/uL (ref 0.1–0.9)
Monocytes: 7 %
NEUTROS ABS: 2.1 10*3/uL (ref 1.4–7.0)
Neutrophils: 45 %
PLATELETS: 293 10*3/uL (ref 150–379)
RBC: 4.86 x10E6/uL (ref 3.77–5.28)
RDW: 14.3 % (ref 12.3–15.4)
WBC: 4.8 10*3/uL (ref 3.4–10.8)

## 2017-07-04 LAB — THYROID PANEL WITH TSH
Free Thyroxine Index: 1.2 (ref 1.2–4.9)
T3 UPTAKE RATIO: 20 % — AB (ref 24–39)
T4, Total: 6.1 ug/dL (ref 4.5–12.0)
TSH: 1.53 u[IU]/mL (ref 0.450–4.500)

## 2017-07-04 LAB — LIPID PANEL
CHOL/HDL RATIO: 5.9 ratio — AB (ref 0.0–4.4)
Cholesterol, Total: 296 mg/dL — ABNORMAL HIGH (ref 100–199)
HDL: 50 mg/dL (ref >39)
LDL Calculated: 225 mg/dL — ABNORMAL HIGH (ref 0–99)
Triglycerides: 103 mg/dL (ref 0–149)
VLDL Cholesterol Cal: 21 mg/dL (ref 5–40)

## 2017-07-04 LAB — VITAMIN D 25 HYDROXY (VIT D DEFICIENCY, FRACTURES): VIT D 25 HYDROXY: 23.2 ng/mL — AB (ref 30.0–100.0)

## 2017-07-04 MED ORDER — GEMFIBROZIL 600 MG PO TABS: 600.0000 mg | ORAL_TABLET | Freq: Two times a day (BID) | ORAL | 2 refills | Status: DC

## 2017-07-04 MED ORDER — VITAMIN D (ERGOCALCIFEROL) 1.25 MG (50000 UNIT) PO CAPS: 50000.0000 [IU] | ORAL_CAPSULE | ORAL | 0 refills | Status: DC

## 2017-07-04 NOTE — Telephone Encounter (Signed)
Contact patient to advise her total cholesterol and LDL (bad) cholesterol are severely elevated. As she can't tolerate statins, I recommend that she continues fish oil also increase to 2000 mg daily and I am adding Lopid 600 mg twice daily before meals to help improve her cholesterol. I will recheck her cholesterol in 3 months. Vitamin D level is low-I have prescribed vitamin D 50,0000 units once weekly and will also repeat this level in 3 months.  [facility]   Yisel Megill S. Tiburcio PeaHarris, MSN, FNP-C [facility]  [address] [place], [place] [address] [phone]

## 2017-07-05 NOTE — Telephone Encounter (Signed)
Left a vm for patient to callback 

## 2017-07-06 NOTE — Telephone Encounter (Signed)
Patient notified and will pick up medication  

## 2017-07-06 NOTE — Telephone Encounter (Signed)
Left a vm for patient to callback 

## 2017-07-17 ENCOUNTER — Ambulatory Visit (INDEPENDENT_AMBULATORY_CARE_PROVIDER_SITE_OTHER): Payer: Self-pay | Admitting: Family Medicine

## 2017-07-17 VITALS — BP 144/84 | HR 84 | Temp 98.4°F | Resp 16 | Ht 62.5 in | Wt 163.0 lb

## 2017-07-17 DIAGNOSIS — Z23 Encounter for immunization: Secondary | ICD-10-CM

## 2017-07-17 DIAGNOSIS — K6289 Other specified diseases of anus and rectum: Secondary | ICD-10-CM

## 2017-07-17 DIAGNOSIS — I10 Essential (primary) hypertension: Secondary | ICD-10-CM

## 2017-07-17 MED ORDER — HYDROCORTISONE ACETATE 25 MG RE SUPP
25.0000 mg | Freq: Two times a day (BID) | RECTAL | 3 refills | Status: DC
Start: 2017-07-17 — End: 2017-11-16

## 2017-07-17 MED ORDER — TRIAMTERENE-HCTZ 37.5-25 MG PO TABS: 2.0000 | ORAL_TABLET | Freq: Every day | ORAL | 1 refills | Status: DC

## 2017-07-17 MED ORDER — TRIAMTERENE-HCTZ 37.5-25 MG PO TABS: 1.0000 | ORAL_TABLET | Freq: Two times a day (BID) | ORAL | 3 refills | Status: DC

## 2017-07-17 NOTE — Progress Notes (Signed)
Patient ID: Heidi Tran, female    DOB: 1961/02/24, 57 y.o.   MRN: [id number]  PCP: Bing NeighborsHarris, Heidi Herschberger S, FNP  Chief Complaint  Patient presents with  . Follow-up    2 weeks for HTN    Subjective:  HPI Heidi Tran is a 57 y.o. female presents for evaluation of hypertension. Heidi Tran established care two weeks ago and was resumed on previously prescribed antihypertension medication. She had undergone an employment physical and was advised due to accelerated hypertension, she was referred to establish with PCP for blood pressure management. Heidi Tran has not checked her blood pressure. Denies headaches, dizziness, chest pain, shortness of breath, or any side effects since resuming her medication. She has a complaint of experiencing rectal discomfort with defecation. Reports a history of constipation and hemorrhoids. Occasionally notes bright red blood on tissue after bowel movements.  Social History   Socioeconomic History  . Marital status: Single    Spouse name: Not on file  . Number of children: Not on file  . Years of education: Not on file  . Highest education level: Not on file  Social Needs  . Financial resource strain: Not on file  . Food insecurity - worry: Not on file  . Food insecurity - inability: Not on file  . Transportation needs - medical: Not on file  . Transportation needs - non-medical: Not on file  Occupational History  . 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 Smoking status: Never Smoker  . Smokeless tobacco: Never Used  Substance and Sexual Activity  . Alcohol use: No  . Drug use: Not on file  . Sexual activity: Not on file  Other Topics Concern  . Not on file  Social History Narrative  . Not on file    Family History  Problem Relation Age of Onset  . Cancer Mother   . Prostate cancer Father   . Heart disease Father    Review of Systems  Pertinent negatives listed in HPI Patient Active Problem List   Diagnosis Date Noted  . GERD 02/11/2010  .  HYPERTENSION 07/26/2008  . Seasonal and perennial allergic rhinitis 10/11/2007  . 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 Statins Other (See Comments)    Leg cramps  . Sulfonamide Derivatives     REACTION: rash    Prior to Admission medications   Medication Sig Start Date End Date Taking? Authorizing Provider  albuterol (PROVENTIL) (2.5 MG/3ML) 0.083% nebulizer solution Take 3 mLs (2.5 mg total) by nebulization every 6 (six) hours as needed. 03/03/14  Yes Young, Joni Fearslinton D, MD  amoxicillin (AMOXIL) 500 MG tablet Take 1 tablet (500 mg total) by mouth 2 (two) times daily. 01/15/17  Yes Jetty DuhamelYoung, Clinton D, MD  carisoprodol (SOMA) 350 MG tablet Take 350 mg by mouth 4 (four) times daily as needed for muscle spasms.   Yes [provider]  cetirizine (ZYRTEC) 10 MG tablet Take 10 mg by mouth daily.   Yes [provider]  cetirizine-pseudoephedrine (ZYRTEC-D) 5-120 MG per tablet Take 1 tablet by mouth daily. Alternates with Zyrtec 10 mg as needed   Yes [provider]  COMBIVENT RESPIMAT 20-100 MCG/ACT AERS respimat INHALE 1 PUFF BY MOUTH EVERY 4 HOURS AS NEEDED 08/18/15  Yes Jetty DuhamelYoung, Clinton D, MD  famotidine (PEPCID) 20 MG tablet 1 twice daily before meals 03/03/14  Yes Young, Clinton D, MD  fish oil-omega-3 fatty acids 1000 MG capsule Take 1 g by mouth  daily.     Yes [provider]  Fluticasone-Salmeterol 113-14 MCG/ACT AEPB INHALE 1 PUFF INTO THE LUNGS TWICE DAILY 12/27/16  Yes Jetty Duhamel D, MD  gemfibrozil (LOPID) 600 MG tablet Take 1 tablet (600 mg total) by mouth 2 (two) times daily before a meal. 07/04/17  Yes Bing Neighbors, FNP  Multiple Vitamin (MULTIVITAMIN) capsule Take 1 capsule by mouth daily.     Yes [provider]  naproxen sodium (ANAPROX) 220 MG tablet Take 440 mg by mouth daily.    Yes [provider]  NON FORMULARY Allergy vaccines 1:10 weekly GO   Yes [provider]  triamterene-hydrochlorothiazide (MAXZIDE-25) 37.5-25 MG tablet Take 1 tablet by mouth daily. 07/03/17  Yes Bing Neighbors, FNP  Vitamin D, Ergocalciferol, (DRISDOL) 50000 units CAPS capsule Take 1 capsule (50,000 Units total) by mouth every 7 (seven) days. 07/04/17  Yes Bing Neighbors, FNP    Past Medical, Surgical Family and Social History reviewed and updated.    Objective:   Today's Vitals   07/17/17 1047  BP: (!) 144/84  Pulse: 84  Resp: 16  Temp: 98.4 F (36.9 C)  TempSrc: Oral  SpO2: 100%  Weight: 163 lb (73.9 kg)  Height: 5' 2.5" (1.588 m)    Wt Readings from Last 3 Encounters:  07/17/17 163 lb (73.9 kg)  07/03/17 168 lb (76.2 kg)  01/15/17 173 lb (78.5 kg)    Physical Exam  Constitutional: She is oriented to person, place, and time. She appears well-developed and well-nourished.  Eyes: Conjunctivae and EOM are normal. Pupils are equal, round, and reactive to light.  Cardiovascular: Normal rate, regular rhythm, normal heart sounds and intact distal pulses.  Pulmonary/Chest: Effort normal and breath sounds normal.  Genitourinary: Rectal exam shows guaiac negative stool.  Genitourinary Comments: Presence of internal hemorrhoids within the rectal canal   Neurological: She is alert and oriented to person, place, and time.  Skin: Skin is warm.  Psychiatric: She has a normal mood and affect. Her behavior is normal. Judgment and thought content normal.   Assessment & Plan:  1. Rectal pain, secondary to hemorrhoids and constipation. Encouraged increase fiber, water intake, and will trial Anusol Suppositories for management of hemorrhoids. She is overdue for colonoscopy however she is uninsured, therefore she has been given a [employer] Assistance application to complete in order to be referred to for this screening.   2. Essential hypertension, stable, not at goal today. Will increase Maxzide 1 tablet, twice daily to improve blood pressure  control. We have discussed target BP range and blood pressure goal. I have advised patient to check BP regularly and to call us back or report to clinic if the numbers are consistently higher than 140/90. We discussed the importance of compliance with medical therapy and DASH diet recommended, consequences of uncontrolled hypertension discussed.   3. Need for immunization against influenza- Flu Vaccine QUAD 36+ mos IM   Meds ordered this encounter  Medications  . DISCONTD: triamterene-hydrochlorothiazide (MAXZIDE-25) 37.5-25 MG tablet    Sig: Take 2 tablets by mouth daily.    Dispense:  90 tablet    Refill:  1    Order Specific Question:   Supervising Provider    Answer:   Quentin Angst [id number]  . triamterene-hydrochlorothiazide (MAXZIDE-25) 37.5-25 MG tablet    Sig: Take 1 tablet by mouth 2 (two) times daily.    Dispense:  60 tablet    Refill:  3    Order  Specific Question:   Supervising Provider    Answer:   Quentin Angst [id number]  . hydrocortisone (ANUSOL-HC) 25 MG suppository    Sig: Place 1 suppository (25 mg total) rectally 2 (two) times daily.    Dispense:  24 suppository    Refill:  3    Order Specific Question:   Supervising Provider    Answer:   Quentin Angst [[id number]]    RTC: 3 months for chronic condition management   Godfrey Pick. Tiburcio Pea, MSN, FNP-C [facility]  [address] [place], [place] [address] [phone]

## 2017-07-17 NOTE — Patient Instructions (Addendum)
Increased Maxzide to 1 pill twice daily for hypertension management.  If your blood pressure is greater than 150/90 consistently , return for care.     Hypertension Hypertension is another name for high blood pressure. High blood pressure forces your heart to work harder to pump blood. This can cause problems over time. There are two numbers in a blood pressure reading. There is a top number (systolic) over a bottom number (diastolic). It is best to have a blood pressure below 120/80. Healthy choices can help lower your blood pressure. You may need medicine to help lower your blood pressure if:  Your blood pressure cannot be lowered with healthy choices.  Your blood pressure is higher than 130/80.  Follow these instructions at home: Eating and drinking  If directed, follow the DASH eating plan. This diet includes: ? Filling half of your plate at each meal with fruits and vegetables. ? Filling one quarter of your plate at each meal with whole grains. Whole grains include whole wheat pasta, brown rice, and whole grain bread. ? Eating or drinking low-fat dairy products, such as skim milk or low-fat yogurt. ? Filling one quarter of your plate at each meal with low-fat (lean) proteins. Low-fat proteins include fish, skinless chicken, eggs, beans, and tofu. ? Avoiding fatty meat, cured and processed meat, or chicken with skin. ? Avoiding premade or processed food.  Eat less than 1,500 mg of salt (sodium) a day.  Limit alcohol use to no more than 1 drink a day for nonpregnant women and 2 drinks a day for men. One drink equals 12 oz of beer, 5 oz of wine, or 1 oz of hard liquor. Lifestyle  Work with your doctor to stay at a healthy weight or to lose weight. Ask your doctor what the best weight is for you.  Get at least 30 minutes of exercise that causes your heart to beat faster (aerobic exercise) most days of the week. This may include walking, swimming, or biking.  Get at least 30  minutes of exercise that strengthens your muscles (resistance exercise) at least 3 days a week. This may include lifting weights or pilates.  Do not use any products that contain nicotine or tobacco. This includes cigarettes and e-cigarettes. If you need help quitting, ask your doctor.  Check your blood pressure at home as told by your doctor.  Keep all follow-up visits as told by your doctor. This is important. Medicines  Take over-the-counter and prescription medicines only as told by your doctor. Follow directions carefully.  Do not skip doses of blood pressure medicine. The medicine does not work as well if you skip doses. Skipping doses also puts you at risk for problems.  Ask your doctor about side effects or reactions to medicines that you should watch for. Contact a doctor if:  You think you are having a reaction to the medicine you are taking.  You have headaches that keep coming back (recurring).  You feel dizzy.  You have swelling in your ankles.  You have trouble with your vision. Get help right away if:  You get a very bad headache.  You start to feel confused.  You feel weak or numb.  You feel faint.  You get very bad pain in your: ? Chest. ? Belly (abdomen).  You throw up (vomit) more than once.  You have trouble breathing. Summary  Hypertension is another name for high blood pressure.  Making healthy choices can help lower blood pressure. If your blood  pressure cannot be controlled with healthy choices, you may need to take medicine. This information is not intended to replace advice given to you by your health care provider. Make sure you discuss any questions you have with your health care provider. Document Released: 11/01/2007 Document Revised: 04/12/2016 Document Reviewed: 04/12/2016 Elsevier Interactive Patient Education  [employer].

## 2017-08-02 MED FILL — TRIAMTERENE/HCTZ 37.5/25 TB: 37.5-25 | 30 days supply | Qty: 60 | Fill #0

## 2017-08-02 MED FILL — VIT D2 1.25 MG (50,000 UNIT: 1.25 MG | 28 days supply | Qty: 4 | Fill #0

## 2017-09-12 MED FILL — TRIAMTERENE/HCTZ 37.5/25 TB: 37.5-25 | 30 days supply | Qty: 60 | Fill #1

## 2017-09-30 ENCOUNTER — Emergency Department (HOSPITAL_COMMUNITY)
Admission: EM | Admit: 2017-09-30 | Discharge: 2017-09-30 | Disposition: A | Discharge status: Home / Self Care | Payer: Self-pay | Attending: Emergency Medicine | Admitting: Emergency Medicine

## 2017-09-30 ENCOUNTER — Encounter (HOSPITAL_COMMUNITY): Payer: Self-pay | Admitting: Emergency Medicine

## 2017-09-30 DIAGNOSIS — Z5321 Procedure and treatment not carried out due to patient leaving prior to being seen by health care provider: Secondary | ICD-10-CM | POA: Insufficient documentation

## 2017-09-30 DIAGNOSIS — R195 Other fecal abnormalities: Secondary | ICD-10-CM | POA: Insufficient documentation

## 2017-09-30 LAB — COMPREHENSIVE METABOLIC PANEL
ALT: 19 U/L (ref 14–54)
ANION GAP: 13 (ref 5–15)
AST: 25 U/L (ref 15–41)
Albumin: 4.4 g/dL (ref 3.5–5.0)
Alkaline Phosphatase: 92 U/L (ref 38–126)
BUN: 16 mg/dL (ref 6–20)
CHLORIDE: 101 mmol/L (ref 101–111)
CO2: 24 mmol/L (ref 22–32)
Calcium: 10.1 mg/dL (ref 8.9–10.3)
Creatinine, Ser: 0.92 mg/dL (ref 0.44–1.00)
GFR calc Af Amer: >60 mL/min (ref >60)
GFR calc non Af Amer: >60 mL/min (ref >60)
Glucose, Bld: 115 mg/dL — ABNORMAL HIGH (ref 65–99)
Potassium: 2.8 mmol/L — ABNORMAL LOW (ref 3.5–5.1)
SODIUM: 138 mmol/L (ref 135–145)
Total Bilirubin: 0.8 mg/dL (ref 0.3–1.2)
Total Protein: 7.4 g/dL (ref 6.5–8.1)

## 2017-09-30 LAB — CBC
HCT: 37.2 % (ref 36.0–46.0)
HEMOGLOBIN: 12.6 g/dL (ref 12.0–15.0)
MCH: 27.2 pg (ref 26.0–34.0)
MCHC: 33.9 g/dL (ref 30.0–36.0)
MCV: 80.2 fL (ref 78.0–100.0)
Platelets: 284 10*3/uL (ref 150–400)
RBC: 4.64 MIL/uL (ref 3.87–5.11)
RDW: 13.8 % (ref 11.5–15.5)
WBC: 7.7 10*3/uL (ref 4.0–10.5)

## 2017-09-30 LAB — TYPE AND SCREEN
ABO/RH(D): A POS
Antibody Screen: NEGATIVE

## 2017-09-30 LAB — I-STAT BETA HCG BLOOD, ED (MC, WL, AP ONLY): HCG, QUANTITATIVE: 5.3 m[IU]/mL — AB (ref <5)

## 2017-09-30 LAB — ABO/RH: ABO/RH(D): A POS

## 2017-09-30 NOTE — ED Notes (Signed)
Pt called from the lobby with no response x2 

## 2017-09-30 NOTE — ED Triage Notes (Signed)
Patient here from home with complaints of GI bleed. States that she has had 5 episodes bright red bloody diarrhea. Denies nausea, vomiting.

## 2017-09-30 NOTE — ED Notes (Signed)
Pt didn't answer when called for room  

## 2017-10-15 ENCOUNTER — Encounter: Payer: Self-pay | Admitting: Family Medicine

## 2017-10-15 ENCOUNTER — Ambulatory Visit (INDEPENDENT_AMBULATORY_CARE_PROVIDER_SITE_OTHER): Payer: Self-pay | Admitting: Family Medicine

## 2017-10-15 VITALS — BP 120/80 | HR 78 | Temp 97.8°F | Ht 62.5 in | Wt 157.0 lb

## 2017-10-15 DIAGNOSIS — Z09 Encounter for follow-up examination after completed treatment for conditions other than malignant neoplasm: Secondary | ICD-10-CM

## 2017-10-15 DIAGNOSIS — I10 Essential (primary) hypertension: Secondary | ICD-10-CM

## 2017-10-15 DIAGNOSIS — K625 Hemorrhage of anus and rectum: Secondary | ICD-10-CM

## 2017-10-15 DIAGNOSIS — J4540 Moderate persistent asthma, uncomplicated: Secondary | ICD-10-CM

## 2017-10-15 DIAGNOSIS — E876 Hypokalemia: Secondary | ICD-10-CM

## 2017-10-15 LAB — POCT URINALYSIS DIP (MANUAL ENTRY)
Bilirubin, UA: NEGATIVE
Blood, UA: NEGATIVE
Glucose, UA: NEGATIVE mg/dL
Ketones, POC UA: NEGATIVE mg/dL
Nitrite, UA: NEGATIVE
Protein Ur, POC: NEGATIVE mg/dL
Spec Grav, UA: 1.015 (ref 1.010–1.025)
Urobilinogen, UA: 0.2 E.U./dL
pH, UA: 7 (ref 5.0–8.0)

## 2017-10-15 MED ORDER — FLUTICASONE-SALMETEROL 113-14 MCG/ACT IN AEPB: 1.0000 | INHALATION_SPRAY | Freq: Two times a day (BID) | RESPIRATORY_TRACT | 5 refills | Status: DC

## 2017-10-15 MED ORDER — FLUTICASONE-SALMETEROL 115-21 MCG/ACT IN AERO: 2.0000 | INHALATION_SPRAY | Freq: Two times a day (BID) | RESPIRATORY_TRACT | 12 refills | Status: DC

## 2017-10-15 MED ORDER — POTASSIUM CHLORIDE CRYS ER 20 MEQ PO TBCR: 20.0000 meq | EXTENDED_RELEASE_TABLET | Freq: Every day | ORAL | 3 refills | Status: DC

## 2017-10-15 MED FILL — POTASSIUM CL ER 20 MEQ TAB: 20 | 30 days supply | Qty: 30 | Fill #0

## 2017-10-15 NOTE — Progress Notes (Signed)
Subjective:     Patient ID: Heidi Tran, female   DOB: [birth year], 57 y.o.   MRN: [id number]   Chief Complaint  Patient presents with  . Follow-up    HTN    HPI  Patient has a history of Hypertension and Asthma.   Current Status: Her blood pressure is stable today. Triamterene-HCTZ 37.5-25 mg was increased to BID in February.   She was recently in ED on 09/30/2017 rectal and GI bleeding/pain.  She states that she had begin to have rectal bleeding on 09/29/2017. Unfortunately, she left ED, without complete assessment, because of the long wait. She continues to have bleeding. She denies any other episodes of bleeding. She denies abdominal pain, nausea, vomiting, and constipation.   She states that she occasionally shortness of breath. She has a history of Asthma. She denies cough, chest pain, and heart palpitations. She denies recent fevers, chills, infections, increase fatigue, unintentional weight loss, and night sweats. She does not report visual changes, headaches, dizziness, unsteadiness, and falls.   She does not report anxiety/depression at this time.  She states that she does have muscle aches because of the physical duties of her job.   Past Medical History:  Diagnosis Date  . Allergic rhinitis, cause unspecified   . Asthma   . Unspecified essential hypertension    Family History  Problem Relation Age of Onset  . Cancer Mother   . Prostate cancer Father   . Heart disease Father     Social History   Socioeconomic History  . Marital status: Single    Spouse name: Not on file  . Number of children: Not on file  . Years of education: Not on file  . Highest education level: Not on file  Occupational History  . Occupation: Diplomatic Services operational officer  Social Needs  . Financial resource strain: Not on file  . Food insecurity:    Worry: Not on file    Inability: Not on file  . Transportation needs:    Medical: Not on file    Non-medical: Not on file  Tobacco Use  . Smoking  status: Never Smoker  . Smokeless tobacco: Never Used  Substance and Sexual Activity  . Alcohol use: No  . Drug use: Not on file  . Sexual activity: Not on file  Lifestyle  . Physical activity:    Days per week: Not on file    Minutes per session: Not on file  . Stress: Not on file  Relationships  . Social connections:    Talks on phone: Not on file    Gets together: Not on file    Attends religious service: Not on file    Active member of club or organization: Not on file    Attends meetings of clubs or organizations: Not on file    Relationship status: Not on file  . Intimate partner violence:    Fear of current or ex partner: Not on file    Emotionally abused: Not on file    Physically abused: Not on file    Forced sexual activity: Not on file  Other Topics Concern  . Not on file  Social History Narrative  . Not on file    Immunization History  Administered Date(s) Administered  . Influenza Split 02/17/2011, 03/12/2012, 01/27/2013, 02/26/2014  . Influenza Whole 02/26/2009, 02/26/2010  . Influenza,inj,Quad PF,6+ Mos 07/17/2017  . Pneumococcal Polysaccharide-23 05/29/2006   Allergies  Allergen Reactions  . Statins Other (See Comments)    Leg cramps  .  Sulfonamide Derivatives     REACTION: rash     Current Outpatient Medications on File Prior to Visit  Medication Sig Dispense Refill  . albuterol (PROVENTIL) (2.5 MG/3ML) 0.083% nebulizer solution Take 3 mLs (2.5 mg total) by nebulization every 6 (six) hours as needed. 75 mL prn  . COMBIVENT RESPIMAT 20-100 MCG/ACT AERS respimat INHALE 1 PUFF BY MOUTH EVERY 4 HOURS AS NEEDED 36 g PRN  . fish oil-omega-3 fatty acids 1000 MG capsule Take 1 g by mouth daily.      . Fluticasone-Salmeterol 113-14 MCG/ACT AEPB INHALE 1 PUFF INTO THE LUNGS TWICE DAILY 1 each 5  . Multiple Vitamin (MULTIVITAMIN) capsule Take 1 capsule by mouth daily.      . naproxen sodium (ANAPROX) 220 MG tablet Take 440 mg by mouth daily.     Marland Kitchen  triamterene-hydrochlorothiazide (MAXZIDE-25) 37.5-25 MG tablet Take 1 tablet by mouth 2 (two) times daily. 60 tablet 3  . Vitamin D, Ergocalciferol, (DRISDOL) 50000 units CAPS capsule Take 1 capsule (50,000 Units total) by mouth every 7 (seven) days. 30 capsule 0  . carisoprodol (SOMA) 350 MG tablet Take 350 mg by mouth 4 (four) times daily as needed for muscle spasms.    . famotidine (PEPCID) 20 MG tablet 1 twice daily before meals (Patient not taking: Reported on 10/15/2017) 60 tablet prn  . gemfibrozil (LOPID) 600 MG tablet Take 1 tablet (600 mg total) by mouth 2 (two) times daily before a meal. (Patient not taking: Reported on 10/15/2017) 60 tablet 2  . hydrocortisone (ANUSOL-HC) 25 MG suppository Place 1 suppository (25 mg total) rectally 2 (two) times daily. (Patient not taking: Reported on 10/15/2017) 24 suppository 3  . NON FORMULARY Allergy vaccines 1:10 weekly GO     No current facility-administered medications on file prior to visit.    Allergies  Allergen Reactions  . Statins Other (See Comments)    Leg cramps  . Sulfonamide Derivatives     REACTION: rash    BP 120/80 (BP Location: Right Arm, Patient Position: Sitting, Cuff Size: Large)   Pulse 78   Temp 97.8 F (36.6 C) (Oral)   Ht 5' 2.5" (1.588 m)   Wt 157 lb (71.2 kg)   SpO2 100%   BMI 28.26 kg/m    Review of Systems  Constitutional: Negative.   HENT: Negative.   Eyes: Negative.   Respiratory: Negative.   Cardiovascular: Negative.   Endocrine: Negative.   Genitourinary: Negative.   Musculoskeletal: Negative.   Skin: Negative.   Neurological: Negative.   Hematological: Negative.   Psychiatric/Behavioral: Negative.       Objective:   Physical Exam  Constitutional: She is oriented to person, place, and time. She appears well-developed and well-nourished.  HENT:  Head: Normocephalic and atraumatic.  Right Ear: External ear normal.  Left Ear: External ear normal.  Nose: Nose normal.  Mouth/Throat:  Oropharynx is clear and moist.  Eyes: Pupils are equal, round, and reactive to light. Conjunctivae and EOM are normal.  Neck: Normal range of motion. Neck supple.  Cardiovascular: Normal rate, regular rhythm, normal heart sounds and intact distal pulses.  Pulmonary/Chest: Effort normal and breath sounds normal.  Abdominal: Soft. Bowel sounds are normal.  Musculoskeletal: Normal range of motion.  Neurological: She is alert and oriented to person, place, and time.  Skin: Skin is warm and dry.  Nursing note and vitals reviewed.  Assessment:   1. Essential hypertension 2. Hypokalemia 3. Moderate persistent asthma without complication 4. Rectal bleeding 5. Encounter  for screening colonoscopy 6. Follow up   Plan:   1. Essential hypertension Blood pressure is stable at 120/80 today. Urinalysis was stable with trace WBCs. She will continue Triamterene-HCTZ daily as directed. We will continue to monitor. She will follow up in 1 month.  - POCT urinalysis dipstick  2. Hypokalemia Most recent Potassium level on 09/30/2017 was 2.8. We will send Rx for Potassium 20 mEq to pharmacy today. Monitor.   3. Moderate persistent asthma without complication Stable. We will send refill on Advair to pharmacy today. She will continue Combivent and Albuterol as needed.  4. Rectal bleeding She reports rectal bleeding /GI /Vaginal bleeding beginning on 09/29/2017. Patient to ED at [facility], but because of the long wait, she left ED. Labs were drawn  In ED. She has history of Total Abdominal Hysterectomy with Bilateral Salpingoophorectomy. She has had a 6 lb weight loss in a few weeks.   We will plan to order CT Abd/Pelvis because of recent weight loss and abdominal pain.   We will have her apply for financial assistance and orange card.   5. Follow up She will follow up in 1 month for re-assessment of Potassium level, blood pressure, and chronic diseases.    Raliegh Ip,  MSN, FNP-BC Patient Care  [facility] [address] [place], [place] [address] [phone]

## 2017-10-16 ENCOUNTER — Telehealth: Payer: Self-pay | Admitting: Family Medicine

## 2017-10-20 NOTE — Telephone Encounter (Signed)
Attempted to contact patient several times to inform of the need for CT Ab/Pelvis to further evaluate vaginal bleeding.

## 2017-10-26 MED FILL — TRIAMTERENE/HCTZ 37.5/25 TB: 37.5-25 | 30 days supply | Qty: 60 | Fill #2

## 2017-11-16 ENCOUNTER — Encounter: Payer: Self-pay | Admitting: Family Medicine

## 2017-11-16 ENCOUNTER — Ambulatory Visit (INDEPENDENT_AMBULATORY_CARE_PROVIDER_SITE_OTHER): Payer: Self-pay | Admitting: Family Medicine

## 2017-11-16 VITALS — BP 126/80 | HR 84 | Temp 97.6°F | Ht 62.5 in | Wt 155.8 lb

## 2017-11-16 DIAGNOSIS — I10 Essential (primary) hypertension: Secondary | ICD-10-CM

## 2017-11-16 DIAGNOSIS — R42 Dizziness and giddiness: Secondary | ICD-10-CM

## 2017-11-16 DIAGNOSIS — J45909 Unspecified asthma, uncomplicated: Secondary | ICD-10-CM

## 2017-11-16 DIAGNOSIS — K6289 Other specified diseases of anus and rectum: Secondary | ICD-10-CM

## 2017-11-16 DIAGNOSIS — K625 Hemorrhage of anus and rectum: Secondary | ICD-10-CM

## 2017-11-16 DIAGNOSIS — Z09 Encounter for follow-up examination after completed treatment for conditions other than malignant neoplasm: Secondary | ICD-10-CM

## 2017-11-16 DIAGNOSIS — R109 Unspecified abdominal pain: Secondary | ICD-10-CM

## 2017-11-16 LAB — POCT URINALYSIS DIP (MANUAL ENTRY)
Bilirubin, UA: NEGATIVE
Blood, UA: NEGATIVE
Glucose, UA: NEGATIVE mg/dL
Ketones, POC UA: NEGATIVE mg/dL
Nitrite, UA: NEGATIVE
Protein Ur, POC: NEGATIVE mg/dL
Spec Grav, UA: 1.015 (ref 1.010–1.025)
Urobilinogen, UA: 0.2 E.U./dL
pH, UA: 7 (ref 5.0–8.0)

## 2017-11-16 MED ORDER — MECLIZINE HCL 25 MG PO TABS: 25.0000 mg | ORAL_TABLET | Freq: Three times a day (TID) | ORAL | 0 refills | Status: DC | PRN

## 2017-11-16 NOTE — Patient Instructions (Signed)
Meclizine tablets or capsules What is this medicine? MECLIZINE (MEK li zeen) is an antihistamine. It is used to prevent nausea, vomiting, or dizziness caused by motion sickness. It is also used to prevent and treat vertigo (extreme dizziness or a feeling that you or your surroundings are tilting or spinning around). This medicine may be used for other purposes; ask your health care provider or pharmacist if you have questions. COMMON BRAND NAME(S): Antivert, Dramamine Less Drowsy, Medivert, Meni-D What should I tell my health care provider before I take this medicine? They need to know if you have any of these conditions: -glaucoma -lung or breathing disease, like asthma -problems urinating -prostate disease -stomach or intestine problems -an unusual or allergic reaction to meclizine, other medicines, foods, dyes, or preservatives -pregnant or trying to get pregnant -breast-feeding How should I use this medicine? Take this medicine by mouth with a glass of water. Follow the directions on the prescription label. If you are using this medicine to prevent motion sickness, take the dose at least 1 hour before travel. If it upsets your stomach, take it with food or milk. Take your doses at regular intervals. Do not take your medicine more often than directed. Talk to your pediatrician regarding the use of this medicine in children. Special care may be needed. Overdosage: If you think you have taken too much of this medicine contact a poison control center or emergency room at once. NOTE: This medicine is only for you. Do not share this medicine with others. What if I miss a dose? If you miss a dose, take it as soon as you can. If it is almost time for your next dose, take only that dose. Do not take double or extra doses. What may interact with this medicine? Do not take this medicine with any of the following medications: -MAOIs like Carbex, Eldepryl, Marplan, Nardil, and Parnate This medicine  may also interact with the following medications: -alcohol -antihistamines for allergy, cough and cold -certain medicines for anxiety or sleep -certain medicines for depression, like amitriptyline, fluoxetine, sertraline -certain medicines for seizures like phenobarbital, primidone -general anesthetics like halothane, isoflurane, methoxyflurane, propofol -local anesthetics like lidocaine, pramoxine, tetracaine -medicines that relax muscles for surgery -narcotic medicines for pain -phenothiazines like chlorpromazine, mesoridazine, prochlorperazine, thioridazine This list may not describe all possible interactions. Give your health care provider a list of all the medicines, herbs, non-prescription drugs, or dietary supplements you use. Also tell them if you smoke, drink alcohol, or use illegal drugs. Some items may interact with your medicine. What should I watch for while using this medicine? Tell your doctor or healthcare professional if your symptoms do not start to get better or if they get worse. You may get drowsy or dizzy. Do not drive, use machinery, or do anything that needs mental alertness until you know how this medicine affects you. Do not stand or sit up quickly, especially if you are an older patient. This reduces the risk of dizzy or fainting spells. Alcohol may interfere with the effect of this medicine. Avoid alcoholic drinks. Your mouth may get dry. Chewing sugarless gum or sucking hard candy, and drinking plenty of water may help. Contact your doctor if the problem does not go away or is severe. This medicine may cause dry eyes and blurred vision. If you wear contact lenses you may feel some discomfort. Lubricating drops may help. See your eye doctor if the problem does not go away or is severe. What side effects may I   notice from receiving this medicine? Side effects that you should report to your doctor or health care professional as soon as possible: -feeling faint or  lightheaded, falls -fast, irregular heartbeat Side effects that usually do not require medical attention (report to your doctor or health care professional if they continue or are bothersome): -constipation -headache -trouble passing urine or change in the amount of urine -trouble sleeping -upset stomach This list may not describe all possible side effects. Call your doctor for medical advice about side effects. You may report side effects to FDA at 1-800-FDA-1088. Where should I keep my medicine? Keep out of the reach of children. Store at room temperature between 15 and 30 degrees C (59 and 86 degrees F). Keep container tightly closed. Throw away any unused medicine after the expiration date. NOTE: This sheet is a summary. It may not cover all possible information. If you have questions about this medicine, talk to your doctor, pharmacist, or health care provider.  2018 Elsevier/Gold Standard (2015-06-16 19:41:02)  

## 2017-11-16 NOTE — Progress Notes (Signed)
Subjective:    Patient ID: Heidi Tran, female    DOB: [birth year], 59 y.o.   MRN: [id number]   PCP: Raliegh IpNatalie Aniah Pauli, NP  Chief Complaint  Patient presents with  . Follow-up    HTN, chronic conditon    HPI  Heidi Tran has a history of Hypertension, Asthma, and Allergic Rhinitis. She is here for follow up today.   Current Status: She is doing well with today with no complaints. She denies fevers, chills, fatigue, recent infections, weight loss, and night sweats. She has occasional dizziness. She has not had any headaches, visual changes, and falls. She has a chronic cough and occasional shortness of breath., which she r/t her Asthma.   Denies severe headaches, confusion, seizures, double vision, and blurred vision, nausea and vomiting.  No chest pain, and heart palpitations.  No reports of GI problems. She has c/o stomach bloating. She has no reports of blood in stools, dysuria and hematuria.   She has a history of rectal bleeding. She states that she has not had any rectal bleeding recently.   No depression or anxiety.   She has no pain today.   She is in the process of [facility] the [facility] Application.   Past Medical History:  Diagnosis Date  . Allergic rhinitis, cause unspecified   . Asthma   . Unspecified essential hypertension     Family History  Problem Relation Age of Onset  . Cancer Mother   . Prostate cancer Father   . Heart disease Father     Social History   Socioeconomic History  . Marital status: Single    Spouse name: Not on file  . Number of children: Not on file  . Years of education: Not on file  . Highest education level: Not on file  Occupational History  . 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 Financial resource strain: Not on file  . Food insecurity:    Worry: Not on file    Inability: Not on file  . Transportation needs:    Medical: Not on file    Non-medical: Not on file  Tobacco Use  . Smoking status:  Never Smoker  . Smokeless tobacco: Never Used  Substance and Sexual Activity  . Alcohol use: No  . Drug use: Not on file  . Sexual activity: Not on file  Lifestyle  . Physical activity:    Days per week: Not on file    Minutes per session: Not on file  . Stress: Not on file  Relationships  . Social connections:    Talks on phone: Not on file    Gets together: Not on file    Attends religious service: Not on file    Active member of club or organization: Not on file    Attends meetings of clubs or organizations: Not on file    Relationship status: Not on file  . Intimate partner violence:    Fear of current or ex partner: Not on file    Emotionally abused: Not on file    Physically abused: Not on file    Forced sexual activity: Not on file  Other Topics Concern  . Not on file  Social History Narrative  . Not on file    Past Surgical History:  Procedure Laterality Date  . TOTAL ABDOMINAL HYSTERECTOMY W/ BILATERAL SALPINGOOPHORECTOMY      Immunization History  Administered Date(s) Administered  . Influenza Split 02/17/2011, 03/12/2012, 01/27/2013, 02/26/2014  . Influenza Whole  02/26/2009, 02/26/2010  . Influenza,inj,Quad PF,6+ Mos 07/17/2017  . Pneumococcal Polysaccharide-23 05/29/2006    Current Meds  Medication Sig  . albuterol (PROVENTIL) (2.5 MG/3ML) 0.083% nebulizer solution Take 3 mLs (2.5 mg total) by nebulization every 6 (six) hours as needed.  . COMBIVENT RESPIMAT 20-100 MCG/ACT AERS respimat INHALE 1 PUFF BY MOUTH EVERY 4 HOURS AS NEEDED  . famotidine (PEPCID) 20 MG tablet 1 twice daily before meals  . fish oil-omega-3 fatty acids 1000 MG capsule Take 1 g by mouth daily.    . fluticasone-salmeterol (ADVAIR HFA) 115-21 MCG/ACT inhaler Inhale 2 puffs into the lungs 2 (two) times daily.  . Multiple Vitamin (MULTIVITAMIN) capsule Take 1 capsule by mouth daily.    . naproxen sodium (ANAPROX) 220 MG tablet Take 440 mg by mouth daily.   . potassium chloride SA  (K-DUR,KLOR-CON) 20 MEQ tablet Take 1 tablet (20 mEq total) by mouth daily.  Marland Kitchen triamterene-hydrochlorothiazide (MAXZIDE-25) 37.5-25 MG tablet Take 1 tablet by mouth 2 (two) times daily.    Allergies  Allergen Reactions  . Statins Other (See Comments)    Leg cramps  . Sulfonamide Derivatives     REACTION: rash    BP 126/80 (BP Location: Left Arm, Patient Position: Sitting, Cuff Size: Small)   Pulse 84   Temp 97.6 F (36.4 C) (Oral)   Ht 5' 2.5" (1.588 m)   Wt 155 lb 12.8 oz (70.7 kg)   SpO2 100%   BMI 28.04 kg/m   Review of Systems  Constitutional: Negative.   HENT: Negative.   Eyes: Negative.   Respiratory: Positive for cough and shortness of breath (Occasional ).   Cardiovascular: Negative.   Gastrointestinal: Negative.   Endocrine: Negative.   Musculoskeletal: Negative.   Skin: Negative.   Allergic/Immunologic: Negative.   Neurological: Positive for dizziness.  Hematological: Negative.   Psychiatric/Behavioral: Negative.    Objective:   Physical Exam  Constitutional: She is oriented to person, place, and time. She appears well-developed and well-nourished.  HENT:  Head: Normocephalic and atraumatic.  Right Ear: External ear normal.  Left Ear: External ear normal.  Nose: Nose normal.  Mouth/Throat: Oropharynx is clear and moist.  Eyes: Pupils are equal, round, and reactive to light. Conjunctivae and EOM are normal.  Neck: Normal range of motion. Neck supple.  Cardiovascular: Normal rate, regular rhythm, normal heart sounds and intact distal pulses.  Pulmonary/Chest: Effort normal and breath sounds normal.  Abdominal: She exhibits distension.  Abdominal bloating.   Musculoskeletal: Normal range of motion.  Neurological: She is alert and oriented to person, place, and time.  Skin: Skin is warm and dry.  Psychiatric: She has a normal mood and affect. Her behavior is normal. Judgment and thought content normal.  Nursing note and vitals reviewed.  Assessment &  Plan:   1. Essential hypertension Blood pressure is 126/80 today. She will continue Maxzide as prescribed.   Decrease high sodium intake, excessive alcohol intake, increase potassium intake, smoking cessation, and increase physical activity. Follow DASH diet.    - POCT urinalysis dipstick  2. Abdominal pain, unspecified abdominal location We will send order for CT scan to further evaluate rectal bleeding. She is currently applying for Financial Assistance.  - CT ABDOMEN PELVIS W CONTRAST; Future  3. Rectal bleeding No rectal bleeding noticed since 09/2017.  4. Rectal pain She has no reports of rectal pain today.   5. Dizziness - meclizine (ANTIVERT) 25 MG tablet; Take 1 tablet (25 mg total) by mouth 3 (three) times daily  as needed for dizziness.  Dispense: 30 tablet; Refill: 0  6. Asthma Stable. Continue Inhalers as prescribed.   7. Follow up She will follow up in 3 months.    Meds ordered this encounter  Medications  . meclizine (ANTIVERT) 25 MG tablet    Sig: Take 1 tablet (25 mg total) by mouth 3 (three) times daily as needed for dizziness.    Dispense:  30 tablet    Refill:  0   Raliegh Ip,  MSN, FNP-BC Patient [facility] [address] [place], [place] [address] [phone]

## 2017-11-19 ENCOUNTER — Telehealth: Payer: Self-pay

## 2017-11-19 MED FILL — POTASSIUM CL ER 20 MEQ TAB: 20 | 30 days supply | Qty: 30 | Fill #1

## 2017-11-19 NOTE — Telephone Encounter (Signed)
-----   Message from Natalie M Stroud, FNP sent at 11/16/2017  9:19 PM EDT ----- Regarding: "New Rx" Carrie,   Please call Arryn and let her know that we sent an Rx to her pharmacy for Meclizine (for dizziness).   Thanks! 

## 2017-11-19 NOTE — Telephone Encounter (Signed)
-----   Message from Kallie LocksNatalie M Stroud, FNP sent at 11/16/2017  9:19 PM EDT ----- Regarding: "New Rx" Lyla Sonarrie,   Please call Micaella and let her know that we sent an Rx to her pharmacy for Meclizine (for dizziness).   Thanks!

## 2017-11-19 NOTE — Telephone Encounter (Signed)
Patient notified

## 2017-11-19 NOTE — Telephone Encounter (Signed)
Left a vm for patient to callback 

## 2017-11-20 ENCOUNTER — Encounter (HOSPITAL_COMMUNITY): Payer: Self-pay

## 2017-11-20 ENCOUNTER — Ambulatory Visit (HOSPITAL_COMMUNITY)
Admission: RE | Admit: 2017-11-20 | Discharge: 2017-11-20 | Disposition: A | Discharge status: Home / Self Care | Payer: Self-pay | Source: Ambulatory Visit | Attending: Family Medicine | Admitting: Family Medicine

## 2017-11-20 DIAGNOSIS — K5730 Diverticulosis of large intestine without perforation or abscess without bleeding: Secondary | ICD-10-CM | POA: Insufficient documentation

## 2017-11-20 DIAGNOSIS — R109 Unspecified abdominal pain: Secondary | ICD-10-CM | POA: Insufficient documentation

## 2017-11-20 DIAGNOSIS — I700 Atherosclerosis of aorta: Secondary | ICD-10-CM | POA: Insufficient documentation

## 2017-11-20 MED ORDER — IOPAMIDOL (ISOVUE-300) INJECTION 61%
INTRAVENOUS | Status: AC
  Filled 2017-11-20: qty 100

## 2017-11-20 MED ORDER — IOPAMIDOL (ISOVUE-300) INJECTION 61%
100.0000 mL | Freq: Once | INTRAVENOUS | Status: AC | PRN
  Administered 2017-11-20: 100 mL via INTRAVENOUS

## 2017-11-21 ENCOUNTER — Ambulatory Visit: Payer: Self-pay | Attending: Family Medicine

## 2017-11-21 MED FILL — !ADVAIR HFA 115-21 MCG INHA: 115-21 | 30 days supply | Qty: 12 | Fill #0

## 2017-12-03 ENCOUNTER — Telehealth: Payer: Self-pay

## 2017-12-03 NOTE — Telephone Encounter (Signed)
Patient calling for CT results. 

## 2017-12-04 ENCOUNTER — Telehealth: Payer: Self-pay | Admitting: Family Medicine

## 2017-12-04 MED FILL — TRIAMTERENE/HCTZ 37.5/25 TB: 37.5-25 | 30 days supply | Qty: 60 | Fill #3

## 2017-12-04 NOTE — Telephone Encounter (Signed)
Heidi Tran spoke with patient about results

## 2017-12-04 NOTE — Telephone Encounter (Signed)
Reviewed CT scan with patient today.

## 2017-12-04 NOTE — Telephone Encounter (Signed)
Contacted patient again today to review CT scan results. Left message to return call.

## 2017-12-28 ENCOUNTER — Encounter: Payer: Self-pay | Admitting: Internal Medicine

## 2018-01-14 MED FILL — !ADVAIR HFA 115-21 MCG INHA: 115-21 | 30 days supply | Qty: 12 | Fill #1

## 2018-01-14 MED FILL — POTASSIUM CL ER 20 MEQ TAB: 20 | 30 days supply | Qty: 30 | Fill #2

## 2018-01-14 MED FILL — TRIAMTERENE/HCTZ 37.5/25 TB: 37.5-25 | 30 days supply | Qty: 60 | Fill #0

## 2018-01-17 ENCOUNTER — Ambulatory Visit: Payer: Self-pay | Admitting: Internal Medicine

## 2018-02-18 ENCOUNTER — Encounter: Payer: Self-pay | Admitting: Family Medicine

## 2018-02-18 ENCOUNTER — Ambulatory Visit (INDEPENDENT_AMBULATORY_CARE_PROVIDER_SITE_OTHER): Payer: Self-pay | Admitting: Family Medicine

## 2018-02-18 VITALS — BP 126/70 | HR 88 | Temp 98.1°F | Ht 62.5 in | Wt 161.3 lb

## 2018-02-18 DIAGNOSIS — R52 Pain, unspecified: Secondary | ICD-10-CM

## 2018-02-18 DIAGNOSIS — J45909 Unspecified asthma, uncomplicated: Secondary | ICD-10-CM

## 2018-02-18 DIAGNOSIS — I10 Essential (primary) hypertension: Secondary | ICD-10-CM

## 2018-02-18 DIAGNOSIS — R8290 Unspecified abnormal findings in urine: Secondary | ICD-10-CM

## 2018-02-18 DIAGNOSIS — K5900 Constipation, unspecified: Secondary | ICD-10-CM

## 2018-02-18 DIAGNOSIS — R319 Hematuria, unspecified: Secondary | ICD-10-CM

## 2018-02-18 DIAGNOSIS — Z23 Encounter for immunization: Secondary | ICD-10-CM

## 2018-02-18 DIAGNOSIS — Z09 Encounter for follow-up examination after completed treatment for conditions other than malignant neoplasm: Secondary | ICD-10-CM

## 2018-02-18 DIAGNOSIS — Z131 Encounter for screening for diabetes mellitus: Secondary | ICD-10-CM

## 2018-02-18 DIAGNOSIS — M79601 Pain in right arm: Secondary | ICD-10-CM

## 2018-02-18 DIAGNOSIS — N390 Urinary tract infection, site not specified: Secondary | ICD-10-CM

## 2018-02-18 LAB — POCT URINALYSIS DIP (MANUAL ENTRY)
Bilirubin, UA: NEGATIVE
Glucose, UA: NEGATIVE mg/dL
Ketones, POC UA: NEGATIVE mg/dL
Nitrite, UA: NEGATIVE
Protein Ur, POC: NEGATIVE mg/dL
Spec Grav, UA: 1.015 (ref 1.010–1.025)
Urobilinogen, UA: 0.2 E.U./dL
pH, UA: 7 (ref 5.0–8.0)

## 2018-02-18 LAB — POCT GLYCOSYLATED HEMOGLOBIN (HGB A1C): Hemoglobin A1C: 5.8 % — AB (ref 4.0–5.6)

## 2018-02-18 MED ORDER — NAPROXEN 500 MG PO TABS: 500.0000 mg | ORAL_TABLET | Freq: Two times a day (BID) | ORAL | 2 refills | Status: DC

## 2018-02-18 MED ORDER — SULFAMETHOXAZOLE-TRIMETHOPRIM 800-160 MG PO TABS: 1.0000 | ORAL_TABLET | Freq: Two times a day (BID) | ORAL | 0 refills | Status: DC

## 2018-02-18 MED FILL — NAPROXEN 500 MG TABLET: 500 | 30 days supply | Qty: 60 | Fill #0

## 2018-02-18 MED FILL — SULFAMETHOXAZOLE-TMP DS TAB: 800-160 | 7 days supply | Qty: 14 | Fill #0

## 2018-02-18 NOTE — Progress Notes (Signed)
Follow Up  Subjective:    Patient ID: Heidi Tran, female    DOB: 08-28-1960, 57 y.o.   MRN: [id number]   Chief Complaint  Patient presents with  . Follow-up    3 month chronic condition  . hand swelling    HPI  Heidi Tran is a 57 year old female with a past Hypertension, Asthma, and Allergic Rhinitis. She is here today for follow up.   Current Status: Since her last office visit, she is doing well. 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   She denies fevers, chills, fatigue, recent infections, weight loss, and night sweats. She has not had any headaches, visual changes, dizziness, and falls. No chest pain, heart palpitations, cough and shortness of breath reported. No reports of GI problems such as nausea, vomiting, diarrhea, and constipation. She has no reports of blood in stools, dysuria and hematuria. No depression or anxiety reported.   Past Medical History:  Diagnosis Date  . Allergic rhinitis, cause unspecified   . Asthma   . Unspecified essential hypertension     Family History  Problem Relation Age of Onset  . Cancer Mother   . Prostate cancer Father   . Heart disease Father     Social History   Socioeconomic History  . Marital status: Single    Spouse name: Not on file  . Number of children: Not on file  . Years of education: Not on file  . Highest education level: Not on file  Occupational History  . Occupation: Diplomatic Services operational officer  Social Needs  . Financial resource strain: Not on file  . Food insecurity:    Worry: Not on file    Inability: Not on file  . Transportation needs:    Medical: Not on file    Non-medical: Not on file  Tobacco Use  . Smoking status: Never Smoker  . Smokeless tobacco: Never Used  Substance and Sexual Activity  . Alcohol use: No  . Drug use: Not on file  . Sexual activity: Not on file  Lifestyle  . Physical activity:    Days per week: Not on file    Minutes per session: Not on file  . Stress:  Not on file  Relationships  . Social connections:    Talks on phone: Not on file    Gets together: Not on file    Attends religious service: Not on file    Active member of club or organization: Not on file    Attends meetings of clubs or organizations: Not on file    Relationship status: Not on file  . Intimate partner violence:    Fear of current or ex partner: Not on file    Emotionally abused: Not on file    Physically abused: Not on file    Forced sexual activity: Not on file  Other Topics Concern  . Not on file  Social History Narrative  . Not on file    Past Surgical History:  Procedure Laterality Date  . TOTAL ABDOMINAL HYSTERECTOMY W/ BILATERAL SALPINGOOPHORECTOMY      Immunization History  Administered Date(s) Administered  . Influenza Split 02/17/2011, 03/12/2012, 01/27/2013, 02/26/2014  . Influenza Whole 02/26/2009, 02/26/2010  . Influenza,inj,Quad PF,6+ Mos 07/17/2017, 02/18/2018  . Pneumococcal Polysaccharide-23 05/29/2006   Current Meds  Medication Sig  . albuterol (PROVENTIL) (2.5 MG/3ML) 0.083% nebulizer solution Take 3 mLs (2.5 mg total) by nebulization every 6 (six) hours as needed.  Effie Berkshire RESPIMAT  20-100 MCG/ACT AERS respimat INHALE 1 PUFF BY MOUTH EVERY 4 HOURS AS NEEDED  . famotidine (PEPCID) 20 MG tablet 1 twice daily before meals  . fish oil-omega-3 fatty acids 1000 MG capsule Take 1 g by mouth daily.    . fluticasone-salmeterol (ADVAIR HFA) 115-21 MCG/ACT inhaler Inhale 2 puffs into the lungs 2 (two) times daily.  . Multiple Vitamin (MULTIVITAMIN) capsule Take 1 capsule by mouth daily.    . naproxen sodium (ANAPROX) 220 MG tablet Take 440 mg by mouth daily.   . potassium chloride SA (K-DUR,KLOR-CON) 20 MEQ tablet Take 1 tablet (20 mEq total) by mouth daily.  Marland Kitchen. triamterene-hydrochlorothiazide (MAXZIDE-25) 37.5-25 MG tablet Take 1 tablet by mouth 2 (two) times daily.    Allergies  Allergen Reactions  . Statins Other (See Comments)    Leg  cramps  . Sulfonamide Derivatives     REACTION: rash    BP 126/70 (BP Location: Left Arm, Patient Position: Sitting, Cuff Size: Small)   Pulse 88   Temp 98.1 F (36.7 C) (Oral)   Ht 5' 2.5" (1.588 m)   Wt 161 lb 4.8 oz (73.2 kg)   SpO2 97%   BMI 29.03 kg/m   Review of Systems  Constitutional: Negative.   Respiratory: Positive for shortness of breath (occasional).   Cardiovascular: Negative.   Gastrointestinal: Negative.   Genitourinary: Negative.   Musculoskeletal: Positive for arthralgias (right upper extremity pain) and joint swelling (right hand).  Skin: Negative.   Neurological: Negative.     Objective:   Physical Exam  Constitutional: She is oriented to person, place, and time. She appears well-developed and well-nourished.  Cardiovascular: Normal rate, regular rhythm, normal heart sounds and intact distal pulses.  Pulmonary/Chest: Effort normal and breath sounds normal.  Abdominal: Soft.  Neurological: She is alert and oriented to person, place, and time.  Psychiatric: She has a normal mood and affect. Her behavior is normal. Judgment and thought content normal.  Nursing note and vitals reviewed.  Assessment & Plan:   1. Essential hypertension Blood pressure is 126/70 today. She will continue Triamterene-HCTZ today. She will continue to decrease high sodium intake, excessive alcohol intake, increase potassium intake, smoking cessation, and increase physical activity of at least 30 minutes of cardio activity daily. She will continue to follow Heart Healthy or DASH diet. Monitor.   2. Screening for diabetes mellitus Hgb A1c is mildly elevated at 5.8 today. She will continue to decrease foods/beverages high in sugars and carbs and follow Heart Healthy or DASH diet. Increase physical activity to at least 30 minutes cardio exercise daily.  - POCT urinalysis dipstick - POCT glycosylated hemoglobin (Hb A1C)  3. Moderate asthma without complication, unspecified whether  persistent Stable today. Continue Albuterol.   4. Need for immunization against influenza - Flu Vaccine QUAD 36+ mos IM  5. Constipation, unspecified constipation type She will continue Miralax as needed.   6. Urinary tract infection with hematuria, site unspecified - sulfamethoxazole-trimethoprim (BACTRIM DS,SEPTRA DS) 800-160 MG tablet; Take 1 tablet by mouth 2 (two) times daily.  Dispense: 14 tablet; Refill: 0  7. Abnormal urinalysis  8. Follow up She will follow up in 6 months.   Meds ordered this encounter  Medications  . sulfamethoxazole-trimethoprim (BACTRIM DS,SEPTRA DS) 800-160 MG tablet    Sig: Take 1 tablet by mouth 2 (two) times daily.    Dispense:  14 tablet    Refill:  0    Raliegh IpNatalie Omega Slager,  MSN, FNP-C [facility] [place], [place] [address] [phone]

## 2018-02-18 NOTE — Patient Instructions (Signed)
Sulfamethoxazole; Trimethoprim, SMX-TMP tablets What is this medicine? SULFAMETHOXAZOLE; TRIMETHOPRIM or SMX-TMP (suhl fuh meth OK suh zohl; trye METH oh prim) is a combination of a sulfonamide antibiotic and a second antibiotic, trimethoprim. It is used to treat or prevent certain kinds of bacterial infections. It will not work for colds, flu, or other viral infections. This medicine may be used for other purposes; ask your health care provider or pharmacist if you have questions. COMMON BRAND NAME(S): Bacter-Aid DS, Bactrim, Bactrim DS, Septra, Septra DS What should I tell my health care provider before I take this medicine? They need to know if you have any of these conditions: -anemia -asthma -being treated with anticonvulsants -if you frequently drink alcohol containing drinks -kidney disease -liver disease -low level of folic acid or glucose-6-phosphate dehydrogenase -poor nutrition or malabsorption -porphyria -severe allergies -thyroid disorder -an unusual or allergic reaction to sulfamethoxazole, trimethoprim, sulfa drugs, other medicines, foods, dyes, or preservatives -pregnant or trying to get pregnant -breast-feeding How should I use this medicine? Take this medicine by mouth with a full glass of water. Follow the directions on the prescription label. Take your medicine at regular intervals. Do not take it more often than directed. Do not skip doses or stop your medicine early. Talk to your pediatrician regarding the use of this medicine in children. Special care may be needed. This medicine has been used in children as young as 2 months of age. Overdosage: If you think you have taken too much of this medicine contact a poison control center or emergency room at once. NOTE: This medicine is only for you. Do not share this medicine with others. What if I miss a dose? If you miss a dose, take it as soon as you can. If it is almost time for your next dose, take only that dose. Do  not take double or extra doses. What may interact with this medicine? Do not take this medicine with any of the following medications: -aminobenzoate potassium -dofetilide -metronidazole This medicine may also interact with the following medications: -ACE inhibitors like benazepril, enalapril, lisinopril, and ramipril -birth control pills -cyclosporine -digoxin -diuretics -indomethacin -medicines for diabetes -methenamine -methotrexate -phenytoin -potassium supplements -pyrimethamine -sulfinpyrazone -tricyclic antidepressants -warfarin This list may not describe all possible interactions. Give your health care provider a list of all the medicines, herbs, non-prescription drugs, or dietary supplements you use. Also tell them if you smoke, drink alcohol, or use illegal drugs. Some items may interact with your medicine. What should I watch for while using this medicine? Tell your doctor or health care professional if your symptoms do not improve. Drink several glasses of water a day to reduce the risk of kidney problems. Do not treat diarrhea with over the counter products. Contact your doctor if you have diarrhea that lasts more than 2 days or if it is severe and watery. This medicine can make you more sensitive to the sun. Keep out of the sun. If you cannot avoid being in the sun, wear protective clothing and use a sunscreen. Do not use sun lamps or tanning beds/booths. What side effects may I notice from receiving this medicine? Side effects that you should report to your doctor or health care professional as soon as possible: -allergic reactions like skin rash or hives, swelling of the face, lips, or tongue -breathing problems -fever or chills, sore throat -irregular heartbeat, chest pain -joint or muscle pain -pain or difficulty passing urine -red pinpoint spots on skin -redness, blistering, peeling or loosening of   the skin, including inside the mouth -unusual bleeding or  bruising -unusually weak or tired -yellowing of the eyes or skin Side effects that usually do not require medical attention (report to your doctor or health care professional if they continue or are bothersome): -diarrhea -dizziness -headache -loss of appetite -nausea, vomiting -nervousness This list may not describe all possible side effects. Call your doctor for medical advice about side effects. You may report side effects to FDA at 1-800-FDA-1088. Where should I keep my medicine? Keep out of the reach of children. Store at room temperature between 20 to 25 degrees C (68 to 77 degrees F). Protect from light. Throw away any unused medicine after the expiration date. NOTE: This sheet is a summary. It may not cover all possible information. If you have questions about this medicine, talk to your doctor, pharmacist, or health care provider.  2018 Elsevier/Gold Standard (2012-12-20 14:38:26)  

## 2018-02-20 LAB — URINE CULTURE: Organism ID, Bacteria: NO GROWTH

## 2018-02-26 MED FILL — TRIAMTERENE/HCTZ 37.5/25 TB: 37.5-25 | 30 days supply | Qty: 60 | Fill #1

## 2018-03-27 MED FILL — POTASSIUM CL ER 20 MEQ TABL: 20 | 30 days supply | Qty: 30 | Fill #3

## 2018-03-27 MED FILL — NAPROXEN 500 MG TABLET: 500 | 30 days supply | Qty: 60 | Fill #1

## 2018-03-28 ENCOUNTER — Telehealth: Payer: Self-pay

## 2018-03-29 MED ORDER — ALBUTEROL SULFATE (2.5 MG/3ML) 0.083% IN NEBU: 2.5000 mg | INHALATION_SOLUTION | Freq: Four times a day (QID) | RESPIRATORY_TRACT | 99 refills | Status: DC | PRN

## 2018-03-29 NOTE — Telephone Encounter (Signed)
Medication sent to the pharmacy.

## 2018-04-11 ENCOUNTER — Telehealth: Payer: Self-pay

## 2018-04-11 MED ORDER — ALBUTEROL SULFATE (2.5 MG/3ML) 0.083% IN NEBU: 2.5000 mg | INHALATION_SOLUTION | Freq: Four times a day (QID) | RESPIRATORY_TRACT | 99 refills | Status: DC | PRN

## 2018-04-11 MED FILL — $ADVAIR HFA 115-21 MCG INH: 115-21 | 30 days supply | Qty: 12 | Fill #2

## 2018-04-11 MED FILL — ALBUTEROL SUL 2.5 MG/3 ML S: (2.5 MG/3ML | 21 days supply | Qty: 90 | Fill #0

## 2018-04-11 MED FILL — TRIAMTERENE/HCTZ 37.5/25 TB: 37.5-25 | 30 days supply | Qty: 60 | Fill #2

## 2018-04-11 NOTE — Telephone Encounter (Signed)
Medication sent to pharmacy  

## 2018-05-06 MED FILL — ALBUTEROL SUL 2.5 MG/3 ML S: (2.5 MG/3ML | 21 days supply | Qty: 90 | Fill #1

## 2018-05-08 ENCOUNTER — Ambulatory Visit (INDEPENDENT_AMBULATORY_CARE_PROVIDER_SITE_OTHER): Payer: Self-pay | Admitting: Family Medicine

## 2018-05-08 ENCOUNTER — Encounter: Payer: Self-pay | Admitting: Family Medicine

## 2018-05-08 VITALS — BP 140/84 | HR 82 | Temp 98.1°F | Ht 62.5 in | Wt 156.2 lb

## 2018-05-08 DIAGNOSIS — M79601 Pain in right arm: Secondary | ICD-10-CM

## 2018-05-08 DIAGNOSIS — M5442 Lumbago with sciatica, left side: Secondary | ICD-10-CM

## 2018-05-08 DIAGNOSIS — L309 Dermatitis, unspecified: Secondary | ICD-10-CM

## 2018-05-08 DIAGNOSIS — Z09 Encounter for follow-up examination after completed treatment for conditions other than malignant neoplasm: Secondary | ICD-10-CM

## 2018-05-08 DIAGNOSIS — R52 Pain, unspecified: Secondary | ICD-10-CM

## 2018-05-08 DIAGNOSIS — J45909 Unspecified asthma, uncomplicated: Secondary | ICD-10-CM

## 2018-05-08 DIAGNOSIS — I10 Essential (primary) hypertension: Secondary | ICD-10-CM

## 2018-05-08 LAB — POCT URINALYSIS DIP (MANUAL ENTRY)
Bilirubin, UA: NEGATIVE
Blood, UA: NEGATIVE
Glucose, UA: NEGATIVE mg/dL
Ketones, POC UA: NEGATIVE mg/dL
Nitrite, UA: NEGATIVE
Protein Ur, POC: NEGATIVE mg/dL
Spec Grav, UA: 1.015 (ref 1.010–1.025)
Urobilinogen, UA: 0.2 E.U./dL
pH, UA: 7 (ref 5.0–8.0)

## 2018-05-08 MED ORDER — CYCLOBENZAPRINE HCL 10 MG PO TABS: 10.0000 mg | ORAL_TABLET | Freq: Three times a day (TID) | ORAL | 2 refills | Status: DC | PRN

## 2018-05-08 MED ORDER — NAPROXEN 500 MG PO TABS: 500.0000 mg | ORAL_TABLET | Freq: Two times a day (BID) | ORAL | 3 refills | Status: DC

## 2018-05-08 MED ORDER — HYDROCORTISONE 1 % EX CREA: 1.0000 "application " | TOPICAL_CREAM | Freq: Two times a day (BID) | CUTANEOUS | 3 refills | Status: DC

## 2018-05-08 MED FILL — NAPROXEN 500 MG TABLET: 500 | 30 days supply | Qty: 60 | Fill #0

## 2018-05-08 MED FILL — CYCLOBENZAPRINE 10 MG TAB: 10 | 10 days supply | Qty: 30 | Fill #0

## 2018-05-08 NOTE — Progress Notes (Signed)
Sick Visit  Subjective:    Patient ID: Heidi Tran, female    DOB: [birth year], 57 y.o.   MRN: [id number]  Chief Complaint  Patient presents with  . Back Injury  . Leg Pain   HPI  Heidi Tran is a 57 year old female with a past medical history of Hypertension, Asthma, and Allergic Rhinitis. She is here today for a sick visit.  Current Status: Since her last office visit, she is doing well with reports of right leg pain/numbness/weakness for the past 3 weeks. She states that she has had an incidence of almost falling while at work. She continues to take Naproxen as prescribed for relief. She denies visual changes, chest pain, cough, shortness of breath, heart palpitations, and falls. She has occasionally headaches and dizziness with position changes. Denies severe headaches, confusion, seizures, double vision, and blurred vision, nausea and vomiting.  She reports occasional shortness of breath. She denies visual changes, chest pain, cough, heart palpitations, and falls. She has occasionally headaches and dizziness with position changes. Denies severe headaches, confusion, seizures, double vision, and blurred vision, nausea and vomiting.  She denies fevers, chills, fatigue, recent infections, weight loss, and night sweats. No reports of GI problems such as nausea, vomiting, diarrhea, and constipation. She has no reports of blood in stools, dysuria and hematuria. No depression or anxiety reported. She denies pain today.   Review of Systems  Constitutional: Negative.   HENT: Negative.   Eyes: Negative.   Respiratory: Positive for shortness of breath (r/t asthma).   Cardiovascular: Negative.   Gastrointestinal: Negative.   Endocrine: Negative.   Genitourinary: Negative.   Musculoskeletal: Positive for back pain (lower left).  Skin: Negative.   Allergic/Immunologic: Negative.   Neurological: Positive for dizziness and headaches.  Hematological: Negative.   Psychiatric/Behavioral:  Negative.    Objective:   Physical Exam  Constitutional: She is oriented to person, place, and time. She appears well-developed and well-nourished.  HENT:  Head: Normocephalic and atraumatic.  Eyes: Pupils are equal, round, and reactive to light. Conjunctivae and EOM are normal.  Neck: Normal range of motion. Neck supple.  Cardiovascular: Normal rate, regular rhythm and intact distal pulses.  Pulmonary/Chest: Effort normal and breath sounds normal.  Abdominal: Soft. Bowel sounds are normal.  Musculoskeletal: Normal range of motion.  Neurological: She is alert and oriented to person, place, and time.  Skin: Skin is warm and dry.  Psychiatric: She has a normal mood and affect. Her behavior is normal. Judgment normal.  Nursing note and vitals reviewed.  Assessment & Plan:   1. Low back pain with left-sided sciatica, unspecified back pain laterality, unspecified chronicity Stable. We will initiate Flexeril today. Continue Naproxen as prescribed.   2. Pain  3. Right arm pain Stable. Not worsening. Continue Naproxen as needed.   4. Essential hypertension Antihypertensive medications are effective. Blood pressure is 140/84 today. Continue Triamterene/HCTZ as prescribed. She will continue to decrease high sodium intake, excessive alcohol intake, increase potassium intake, smoking cessation, and increase physical activity of at least 30 minutes of cardio activity daily. She will continue to follow Heart Healthy or DASH diet.  5. Moderate asthma without complication, unspecified whether persistent Stable today. No signs and symptoms of respiratory distress. Continue inhalers as prescribed.   6. Eczema We will initiate Hydrocortisone cream today.   7. Follow up She will follow up in 6 months.  - POCT urinalysis dipstick  Meds ordered this encounter  Medications  . naproxen (NAPROSYN) 500 MG tablet  Sig: Take 1 tablet (500 mg total) by mouth 2 (two) times daily with a meal.     Dispense:  60 tablet    Refill:  3  . cyclobenzaprine (FLEXERIL) 10 MG tablet    Sig: Take 1 tablet (10 mg total) by mouth 3 (three) times daily as needed for muscle spasms.    Dispense:  30 tablet    Refill:  2  . hydrocortisone cream 1 %    Sig: Apply 1 application topically 2 (two) times daily.    Dispense:  30 g    Refill:  3   Raliegh IpNatalie Jayleah Garbers,  MSN, FNP-C [facility] [address]  [place], [place] [id number] [phone]

## 2018-05-08 NOTE — Patient Instructions (Addendum)
Cyclobenzaprine tablets What is this medicine? CYCLOBENZAPRINE (sye kloe BEN za preen) is a muscle relaxer. It is used to treat muscle pain, spasms, and stiffness. This medicine may be used for other purposes; ask your health care provider or pharmacist if you have questions. COMMON BRAND NAME(S): Fexmid, Flexeril What should I tell my health care provider before I take this medicine? They need to know if you have any of these conditions: -heart disease, irregular heartbeat, or previous heart attack -liver disease -thyroid problem -an unusual or allergic reaction to cyclobenzaprine, tricyclic antidepressants, lactose, other medicines, foods, dyes, or preservatives -pregnant or trying to get pregnant -breast-feeding How should I use this medicine? Take this medicine by mouth with a glass of water. Follow the directions on the prescription label. If this medicine upsets your stomach, take it with food or milk. Take your medicine at regular intervals. Do not take it more often than directed. Talk to your pediatrician regarding the use of this medicine in children. Special care may be needed. Overdosage: If you think you have taken too much of this medicine contact a poison control center or emergency room at once. NOTE: This medicine is only for you. Do not share this medicine with others. What if I miss a dose? If you miss a dose, take it as soon as you can. If it is almost time for your next dose, take only that dose. Do not take double or extra doses. What may interact with this medicine? Do not take this medicine with any of the following medications: -certain medicines for fungal infections like fluconazole, itraconazole, ketoconazole, posaconazole, voriconazole -cisapride -dofetilide -dronedarone -halofantrine -levomethadyl -MAOIs like Carbex, Eldepryl, Marplan, Nardil, and Parnate -narcotic medicines for cough -pimozide -thioridazine -ziprasidone This medicine may also interact  with the following medications: -alcohol -antihistamines for allergy, cough and cold -certain medicines for anxiety or sleep -certain medicines for cancer -certain medicines for depression like amitriptyline, fluoxetine, sertraline -certain medicines for infection like alfuzosin, chloroquine, clarithromycin, levofloxacin, mefloquine, pentamidine, troleandomycin -certain medicines for irregular heart beat -certain medicines for seizures like phenobarbital, primidone -contrast dyes -general anesthetics like halothane, isoflurane, methoxyflurane, propofol -local anesthetics like lidocaine, pramoxine, tetracaine -medicines that relax muscles for surgery -narcotic medicines for pain -other medicines that prolong the QT interval (cause an abnormal heart rhythm) -phenothiazines like chlorpromazine, mesoridazine, prochlorperazine This list may not describe all possible interactions. Give your health care provider a list of all the medicines, herbs, non-prescription drugs, or dietary supplements you use. Also tell them if you smoke, drink alcohol, or use illegal drugs. Some items may interact with your medicine. What should I watch for while using this medicine? Tell your doctor or health care professional if your symptoms do not start to get better or if they get worse. You may get drowsy or dizzy. Do not drive, use machinery, or do anything that needs mental alertness until you know how this medicine affects you. Do not stand or sit up quickly, especially if you are an older patient. This reduces the risk of dizzy or fainting spells. Alcohol may interfere with the effect of this medicine. Avoid alcoholic drinks. If you are taking another medicine that also causes drowsiness, you may have more side effects. Give your health care provider a list of all medicines you use. Your doctor will tell you how much medicine to take. Do not take more medicine than directed. Call emergency for help if you have  problems breathing or unusual sleepiness. Your mouth may get dry. Chewing  sugarless gum or sucking hard candy, and drinking plenty of water may help. Contact your doctor if the problem does not go away or is severe. What side effects may I notice from receiving this medicine? Side effects that you should report to your doctor or health care professional as soon as possible: -allergic reactions like skin rash, itching or hives, swelling of the face, lips, or tongue -breathing problems -chest pain -fast, irregular heartbeat -hallucinations -seizures -unusually weak or tired Side effects that usually do not require medical attention (report to your doctor or health care professional if they continue or are bothersome): -headache -nausea, vomiting This list may not describe all possible side effects. Call your doctor for medical advice about side effects. You may report side effects to FDA at 1-800-FDA-1088. Where should I keep my medicine? Keep out of the reach of children. Store at room temperature between 15 and 30 degrees C (59 and 86 degrees F). Keep container tightly closed. Throw away any unused medicine after the expiration date. NOTE: This sheet is a summary. It may not cover all possible information. If you have questions about this medicine, talk to your doctor, pharmacist, or health care provider.  2018 Elsevier/Gold Standard (2015-02-23 12:05:46)   Hydrocortisone skin cream, ointment, lotion, or solution What is this medicine? HYDROCORTISONE (hye droe KOR ti sone) is a corticosteroid. It is used on the skin to reduce swelling, redness, itching, and allergic reactions. This medicine may be used for other purposes; ask your health care provider or pharmacist if you have questions. COMMON BRAND NAME(S): Ala-Cort, Ala-Scalp, Anusol HC, Aqua Glycolic HC, Balneol for Her, Caldecort, Cetacort, Cortaid, Cortaid Advanced, Cortaid Intensive Therapy, Cortaid Sensitive Skin, CortAlo,  Corticaine, Corticool, Cortizone, Cortizone-10, Cortizone-10 Cooling Relief, Cortizone-10 Intensive Healing, Cortizone-10 Plus, Dermarest Dricort, Dermarest Eczema, DERMASORB HC Complete, Gly-Cort, Hycort, Hydro Skin, Hydroskin, Hytone, Instacort, Lacticare HC, Locoid, Locoid Lipocream, MiCort-HC, Monistat Complete Care Instant Itch Relief Cream, Neosporin Eczema, NuCort, Nutracort, NuZon, Pandel, Pediaderm HC, Penecort, Preparation H Hydrocortisone, Procto-Kit, Procto-Med HC, Proctocream-HC, Proctosol-HC, Proctozone-HC, Rederm, Sarnol-HC, Nurse, adult, Engineer, site, Contractor, Tucks HC, Human resources officer What should I tell my health care provider before I take this medicine? They need to know if you have any of these conditions: -any active infection -diabetes -large areas of burned or damaged skin -skin wasting or thinning -an unusual or allergic reaction to hydrocortisone, corticosteroids, sulfites, other medicines, foods, dyes, or preservatives -pregnant or trying to get pregnant -breast-feeding How should I use this medicine? This medicine is for external use only. Do not take by mouth. Follow the directions on the prescription label. Wash your hands before and after use. Apply a thin film of medicine to the affected area. Do not cover with a bandage or dressing unless your doctor or health care professional tells you to. Do not use on healthy skin or over large areas of skin. Do not get this medicine in your eyes. If you do, rinse out with plenty of cool tap water. Do not to use more medicine than prescribed. Do not use your medicine more often than directed or for more than 14 days. Talk to your pediatrician regarding the use of this medicine in children. Special care may be needed. While this drug may be prescribed for children as young as 70 years of age for selected conditions, precautions do apply. Do not use this medicine for the treatment of diaper rash unless directed to do so by your doctor or  health care professional. If applying this medicine to the  diaper area of a child, do not cover with tight-fitting diapers or plastic pants. This may increase the amount of medicine that passes through the skin and increase the risk of serious side effects. Elderly patients are more likely to have damaged skin through aging, and this may increase side effects. This medicine should only be used for brief periods and infrequently in older patients. Overdosage: If you think you have taken too much of this medicine contact a poison control center or emergency room at once. NOTE: This medicine is only for you. Do not share this medicine with others. What if I miss a dose? If you miss a dose, use it as soon as you can. If it is almost time for your next dose, use only that dose. Do not use double or extra doses. What may interact with this medicine? Interactions are not expected. Do not use any other skin products on the affected area without asking your doctor or health care professional. This list may not describe all possible interactions. Give your health care provider a list of all the medicines, herbs, non-prescription drugs, or dietary supplements you use. Also tell them if you smoke, drink alcohol, or use illegal drugs. Some items may interact with your medicine. What should I watch for while using this medicine? Tell your doctor or health care professional if your symptoms do not start to get better within 7 days or if they get worse. Tell your doctor or health care professional if you are exposed to anyone with measles or chickenpox, or if you develop sores or blisters that do not heal properly. What side effects may I notice from receiving this medicine? Side effects that you should report to your doctor or health care professional as soon as possible: -allergic reactions like skin rash, itching or hives, swelling of the face, lips, or tongue -burning feeling on the skin -dark red spots on the  skin -infection -lack of healing of skin condition -painful, red, pus filled blisters in hair follicles -thinning of the skin Side effects that usually do not require medical attention (report to your doctor or health care professional if they continue or are bothersome): -dry skin, irritation -unusual increased growth of hair on the face or body This list may not describe all possible side effects. Call your doctor for medical advice about side effects. You may report side effects to FDA at 1-800-FDA-1088. Where should I keep my medicine? Keep out of the reach of children. Store at room temperature between 15 and 30 degrees C (59 and 86 degrees F). Do not freeze. Throw away any unused medicine after the expiration date. NOTE: This sheet is a summary. It may not cover all possible information. If you have questions about this medicine, talk to your doctor, pharmacist, or health care provider.  2018 Elsevier/Gold Standard (2007-09-27 16:08:48)

## 2018-06-18 ENCOUNTER — Other Ambulatory Visit: Payer: Self-pay | Admitting: Family Medicine

## 2018-06-18 MED FILL — NAPROXEN 500 MG TABLET: 500 | 30 days supply | Qty: 60 | Fill #1

## 2018-06-18 MED FILL — $ADVAIR HFA 115-21 MCG INH: 115-21 | 90 days supply | Qty: 36 | Fill #3

## 2018-06-20 ENCOUNTER — Other Ambulatory Visit: Payer: Self-pay

## 2018-06-20 MED ORDER — POTASSIUM CHLORIDE CRYS ER 20 MEQ PO TBCR: 20.0000 meq | EXTENDED_RELEASE_TABLET | Freq: Every day | ORAL | 3 refills | Status: DC

## 2018-06-20 MED FILL — POTASSIUM CL ER 20 MEQ TAB: 20 | 30 days supply | Qty: 30 | Fill #0

## 2018-06-20 NOTE — Telephone Encounter (Signed)
Medication sent to pharmacy  

## 2018-06-24 ENCOUNTER — Telehealth: Payer: Self-pay

## 2018-06-24 MED ORDER — TRIAMTERENE-HCTZ 37.5-25 MG PO TABS: 1.0000 | ORAL_TABLET | Freq: Two times a day (BID) | ORAL | 3 refills | Status: DC

## 2018-06-24 NOTE — Telephone Encounter (Signed)
Medication sent to pharmacy  

## 2018-06-25 MED FILL — TRIAMTERENE/HCTZ 37.5/25 TB: 37.5-25 | 30 days supply | Qty: 60 | Fill #0

## 2018-07-26 ENCOUNTER — Telehealth: Payer: Self-pay

## 2018-07-29 MED ORDER — COMBIVENT RESPIMAT 20-100 MCG/ACT IN AERS: INHALATION_SPRAY | RESPIRATORY_TRACT | 99 refills | Status: DC

## 2018-07-29 MED FILL — COMBIVENT RESPIMAT INHAL SP: 20-100 | 20 days supply | Qty: 4 | Fill #0

## 2018-07-29 NOTE — Telephone Encounter (Signed)
Medication sent to pharmacy  

## 2018-07-30 ENCOUNTER — Encounter: Payer: Self-pay | Admitting: Family Medicine

## 2018-07-30 ENCOUNTER — Ambulatory Visit (INDEPENDENT_AMBULATORY_CARE_PROVIDER_SITE_OTHER): Payer: BC Managed Care – PPO | Admitting: Family Medicine

## 2018-07-30 VITALS — BP 124/74 | HR 90 | Temp 98.2°F | Ht 62.5 in | Wt 160.4 lb

## 2018-07-30 DIAGNOSIS — J45909 Unspecified asthma, uncomplicated: Secondary | ICD-10-CM

## 2018-07-30 DIAGNOSIS — R21 Rash and other nonspecific skin eruption: Secondary | ICD-10-CM | POA: Diagnosis not present

## 2018-07-30 DIAGNOSIS — R0602 Shortness of breath: Secondary | ICD-10-CM | POA: Insufficient documentation

## 2018-07-30 DIAGNOSIS — I10 Essential (primary) hypertension: Secondary | ICD-10-CM

## 2018-07-30 DIAGNOSIS — Z09 Encounter for follow-up examination after completed treatment for conditions other than malignant neoplasm: Secondary | ICD-10-CM

## 2018-07-30 MED ORDER — METHYLPREDNISOLONE SODIUM SUCC 125 MG IJ SOLR
125.0000 mg | Freq: Once | INTRAMUSCULAR | Status: AC
  Administered 2018-07-30: 125 mg via INTRAMUSCULAR

## 2018-07-30 MED ORDER — ALBUTEROL SULFATE (2.5 MG/3ML) 0.083% IN NEBU
2.5000 mg | INHALATION_SOLUTION | Freq: Once | RESPIRATORY_TRACT | Status: AC
  Administered 2018-07-30: 2.5 mg via RESPIRATORY_TRACT

## 2018-07-30 MED ORDER — PREDNISONE 10 MG PO TABS
10.0000 mg | ORAL_TABLET | Freq: Every day | ORAL | 0 refills | Status: DC
Start: 2018-07-30 — End: 2018-08-14

## 2018-07-30 MED ORDER — TRIAMCINOLONE ACETONIDE 0.1 % EX CREA: 1.0000 "application " | TOPICAL_CREAM | Freq: Two times a day (BID) | CUTANEOUS | 3 refills | Status: DC

## 2018-07-30 MED ORDER — IPRATROPIUM BROMIDE 0.02 % IN SOLN
0.5000 mg | Freq: Once | RESPIRATORY_TRACT | Status: AC
  Administered 2018-07-30: 0.5 mg via RESPIRATORY_TRACT

## 2018-07-30 NOTE — Progress Notes (Signed)
[facility]  Sick Visit  Subjective:  Patient ID: Heidi Tran, female    DOB: 1961-02-11  Age: 58 y.o. MRN: [id number]  CC:  Chief Complaint  Patient presents with  . Asthma  . Shortness of Breath    HPI Heidi SharpsDarlene Sizelove is a 58 year old female who presents for Sick Visit today.   Past Medical History:  Diagnosis Date  . Allergic rhinitis, cause unspecified   . Asthma   . Unspecified essential hypertension    Current Status: Since her last office visit, she is doing well with no complaints. She is having shortness of breath X 3 days now. She is recently getting over a viral illness 1 week ago. She is taking all medications as prescribed. No chest pain, heart palpitations, and cough reported.  She denies fevers, chills, fatigue, recent infections, weight loss, and night sweats. She has not had any headaches, visual changes, dizziness, and falls.  No reports of GI problems such as nausea, vomiting, diarrhea, and constipation. She has no reports of blood in stools, dysuria and hematuria. No depression or anxiety reported. She denies pain today.   Past Surgical History:  Procedure Laterality Date  . TOTAL ABDOMINAL HYSTERECTOMY W/ BILATERAL SALPINGOOPHORECTOMY      Family History  Problem Relation Age of Onset  . Cancer Mother   . Prostate cancer Father   . Heart disease Father     Social History   Socioeconomic History  . Marital status: Single    Spouse name: Not on file  . Number of children: Not on file  . Years of education: Not on file  . Highest education level: Not on file  Occupational History  . 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 Financial resource strain: Not on file  . Food insecurity:    Worry: Not on file    Inability: Not on file  . Transportation needs:    Medical: Not on file    Non-medical: Not on file  Tobacco Use  . Smoking status: Never Smoker  . Smokeless tobacco: Never Used  Substance  and Sexual Activity  . Alcohol use: No  . Drug use: Not on file  . Sexual activity: Not on file  Lifestyle  . Physical activity:    Days per week: Not on file    Minutes per session: Not on file  . Stress: Not on file  Relationships  . Social connections:    Talks on phone: Not on file    Gets together: Not on file    Attends religious service: Not on file    Active member of club or organization: Not on file    Attends meetings of clubs or organizations: Not on file    Relationship status: Not on file  . Intimate partner violence:    Fear of current or ex partner: Not on file    Emotionally abused: Not on file    Physically abused: Not on file    Forced sexual activity: Not on file  Other Topics Concern  . Not on file  Social History Narrative  . Not on file    Outpatient Medications Prior to Visit  Medication Sig Dispense Refill  . albuterol (PROVENTIL) (2.5 MG/3ML) 0.083% nebulizer solution Take 3 mLs (2.5 mg total) by nebulization every 6 (six) hours as needed. 75 mL prn  . COMBIVENT RESPIMAT 20-100 MCG/ACT AERS respimat INHALE 1 PUFF BY MOUTH EVERY 4 HOURS AS NEEDED 36 g  PRN  . cyclobenzaprine (FLEXERIL) 10 MG tablet Take 1 tablet (10 mg total) by mouth 3 (three) times daily as needed for muscle spasms. 30 tablet 2  . famotidine (PEPCID) 20 MG tablet 1 twice daily before meals 60 tablet prn  . fish oil-omega-3 fatty acids 1000 MG capsule Take 1 g by mouth daily.      . fluticasone-salmeterol (ADVAIR HFA) 115-21 MCG/ACT inhaler Inhale 2 puffs into the lungs 2 (two) times daily. 1 Inhaler 12  . hydrocortisone cream 1 % Apply 1 application topically 2 (two) times daily. 30 g 3  . Multiple Vitamin (MULTIVITAMIN) capsule Take 1 capsule by mouth daily.      . potassium chloride SA (K-DUR,KLOR-CON) 20 MEQ tablet Take 1 tablet (20 mEq total) by mouth daily. 30 tablet 3  . triamterene-hydrochlorothiazide (MAXZIDE-25) 37.5-25 MG tablet Take 1 tablet by mouth 2 (two) times daily. 60  tablet 3  . gemfibrozil (LOPID) 600 MG tablet Take 1 tablet (600 mg total) by mouth 2 (two) times daily before a meal. (Patient not taking: Reported on 07/30/2018) 60 tablet 2  . naproxen (NAPROSYN) 500 MG tablet Take 1 tablet (500 mg total) by mouth 2 (two) times daily with a meal. (Patient not taking: Reported on 07/30/2018) 60 tablet 3  . Vitamin D, Ergocalciferol, (DRISDOL) 50000 units CAPS capsule Take 1 capsule (50,000 Units total) by mouth every 7 (seven) days. (Patient not taking: Reported on 05/08/2018) 30 capsule 0  . meclizine (ANTIVERT) 25 MG tablet Take 1 tablet (25 mg total) by mouth 3 (three) times daily as needed for dizziness. 30 tablet 0   No facility-administered medications prior to visit.     Allergies  Allergen Reactions  . Statins Other (See Comments)    Leg cramps  . Sulfonamide Derivatives     REACTION: rash    ROS Review of Systems  Constitutional: Negative.   HENT: Negative.   Eyes: Negative.   Respiratory: Positive for shortness of breath (Occasional).   Cardiovascular: Negative.   Gastrointestinal: Negative.   Endocrine: Negative.   Genitourinary: Negative.   Musculoskeletal: Negative.   Skin: Negative.   Allergic/Immunologic: Negative.   Neurological: Negative.   Hematological: Negative.   Psychiatric/Behavioral: Negative.    Objective:    Physical Exam  Constitutional: She is oriented to person, place, and time. She appears well-developed and well-nourished.  HENT:  Head: Normocephalic and atraumatic.  Eyes: Conjunctivae are normal.  Neck: Normal range of motion. Neck supple.  Cardiovascular: Normal rate, regular rhythm, normal heart sounds and intact distal pulses.  Pulmonary/Chest: Effort normal and breath sounds normal.  Abdominal: Soft. Bowel sounds are normal.  Musculoskeletal: Normal range of motion.  Neurological: She is oriented to person, place, and time.  Skin: Skin is warm and dry.  Psychiatric: She has a normal mood and affect.  Her behavior is normal. Judgment and thought content normal.  Nursing note and vitals reviewed.   BP 124/74 (BP Location: Left Arm, Patient Position: Sitting, Cuff Size: Large)   Pulse 90   Temp 98.2 F (36.8 C) (Oral)   Ht 5' 2.5" (1.588 m)   Wt 160 lb 6.4 oz (72.8 kg)   SpO2 100%   BMI 28.87 kg/m  Wt Readings from Last 3 Encounters:  07/30/18 160 lb 6.4 oz (72.8 kg)  05/08/18 156 lb 3.2 oz (70.9 kg)  02/18/18 161 lb 4.8 oz (73.2 kg)   Health Maintenance Due  Topic Date Due  . Hepatitis C Screening  06/22/1958  .  HIV Screening  11/25/1973  . TETANUS/TDAP  11/25/1977  . PAP SMEAR-Modifier  11/26/1979  . COLONOSCOPY  11/25/2008  . MAMMOGRAM  12/01/2017    There are no preventive care reminders to display for this patient.  Lab Results  Component Value Date   TSH 1.530 07/03/2017   Lab Results  Component Value Date   WBC 7.7 09/30/2017   HGB 12.6 09/30/2017   HCT 37.2 09/30/2017   MCV 80.2 09/30/2017   PLT 284 09/30/2017   Lab Results  Component Value Date   NA 138 09/30/2017   K 2.8 (L) 09/30/2017   CO2 24 09/30/2017   GLUCOSE 115 (H) 09/30/2017   BUN 16 09/30/2017   CREATININE 0.92 09/30/2017   BILITOT 0.8 09/30/2017   ALKPHOS 92 09/30/2017   AST 25 09/30/2017   ALT 19 09/30/2017   PROT 7.4 09/30/2017   ALBUMIN 4.4 09/30/2017   CALCIUM 10.1 09/30/2017   ANIONGAP 13 09/30/2017   GFR 105.95 12/30/2010   Lab Results  Component Value Date   CHOL 296 (H) 07/03/2017   Lab Results  Component Value Date   HDL 50 07/03/2017   Lab Results  Component Value Date   LDLCALC 225 (H) 07/03/2017   Lab Results  Component Value Date   TRIG 103 07/03/2017   Lab Results  Component Value Date   CHOLHDL 5.9 (H) 07/03/2017   Lab Results  Component Value Date   HGBA1C 5.8 (A) 02/18/2018   Assessment & Plan:   1. Moderate asthma without complication, unspecified whether persistent We will initiate Prednisone today. She received Solu-Medrol injection in  office today. She will report to office and ED if symptoms do not improve or worsen.  - triamcinolone cream (KENALOG) 0.1 %; Apply 1 application topically 2 (two) times daily.  Dispense: 30 g; Refill: 3 - methylPREDNISolone sodium succinate (SOLU-MEDROL) 125 mg/2 mL injection 125 mg - predniSONE (DELTASONE) 10 MG tablet; Take 1 tablet (10 mg total) by mouth daily with breakfast. Day #1: Take 6 tablets  Day # 2: Take 5 tablets Day #3: Take 4 tablets Day #4: Take 3 tablets Day #5: Take 2 tablets Day #6: Take 1 tablets, then complete.  Dispense: 21 tablet; Refill: 0 - ipratropium (ATROVENT) nebulizer solution 0.5 mg - albuterol (PROVENTIL) (2.5 MG/3ML) 0.083% nebulizer solution 2.5 mg  2. Shortness of breath - methylPREDNISolone sodium succinate (SOLU-MEDROL) 125 mg/2 mL injection 125 mg - predniSONE (DELTASONE) 10 MG tablet; Take 1 tablet (10 mg total) by mouth daily with breakfast. Day #1: Take 6 tablets  Day # 2: Take 5 tablets Day #3: Take 4 tablets Day #4: Take 3 tablets Day #5: Take 2 tablets Day #6: Take 1 tablets, then complete.  Dispense: 21 tablet; Refill: 0 - ipratropium (ATROVENT) nebulizer solution 0.5 mg - albuterol (PROVENTIL) (2.5 MG/3ML) 0.083% nebulizer solution 2.5 mg  3. Essential hypertension Blood pressure is stable at 124/74 today. She will continue to decrease high sodium intake, excessive alcohol intake, increase potassium intake, smoking cessation, and increase physical activity of at least 30 minutes of cardio activity daily. She will continue to follow Heart Healthy or DASH diet.  4. Rash - triamcinolone cream (KENALOG) 0.1 %; Apply 1 application topically 2 (two) times daily.  Dispense: 30 g; Refill: 3  5. Follow up She will follow up in 2 weeks.    Problem List Items Addressed This Visit    None    Visit Diagnoses    Moderate asthma without complication, unspecified whether  persistent    -  Primary   Relevant Medications   triamcinolone cream  (KENALOG) 0.1 %   methylPREDNISolone sodium succinate (SOLU-MEDROL) 125 mg/2 mL injection 125 mg (Completed)   predniSONE (DELTASONE) 10 MG tablet   ipratropium (ATROVENT) nebulizer solution 0.5 mg (Completed)   albuterol (PROVENTIL) (2.5 MG/3ML) 0.083% nebulizer solution 2.5 mg (Completed)   Shortness of breath       Relevant Medications   methylPREDNISolone sodium succinate (SOLU-MEDROL) 125 mg/2 mL injection 125 mg (Completed)   predniSONE (DELTASONE) 10 MG tablet   ipratropium (ATROVENT) nebulizer solution 0.5 mg (Completed)   albuterol (PROVENTIL) (2.5 MG/3ML) 0.083% nebulizer solution 2.5 mg (Completed)   Essential hypertension       Rash       Relevant Medications   triamcinolone cream (KENALOG) 0.1 %   Follow up          Meds ordered this encounter  Medications  . triamcinolone cream (KENALOG) 0.1 %    Sig: Apply 1 application topically 2 (two) times daily.    Dispense:  30 g    Refill:  3  . methylPREDNISolone sodium succinate (SOLU-MEDROL) 125 mg/2 mL injection 125 mg  . predniSONE (DELTASONE) 10 MG tablet    Sig: Take 1 tablet (10 mg total) by mouth daily with breakfast. Day #1: Take 6 tablets  Day # 2: Take 5 tablets Day #3: Take 4 tablets Day #4: Take 3 tablets Day #5: Take 2 tablets Day #6: Take 1 tablets, then complete.    Dispense:  21 tablet    Refill:  0  . ipratropium (ATROVENT) nebulizer solution 0.5 mg  . albuterol (PROVENTIL) (2.5 MG/3ML) 0.083% nebulizer solution 2.5 mg    Follow-up: No follow-ups on file.    Kallie Locks, FNP

## 2018-07-31 MED FILL — TRIAMCINOLONE ACETONIDE 0.1: 0.1 | 15 days supply | Qty: 30 | Fill #0

## 2018-07-31 NOTE — Telephone Encounter (Signed)
Left a vm for patient to callback and schedule appointment 

## 2018-07-31 NOTE — Telephone Encounter (Signed)
-----   Message from Kallie Locks, FNP sent at 07/30/2018 10:13 PM EST ----- Regarding: "Follow Up" Please schedule patient for follow up with me in 2 weeks.

## 2018-08-01 MED FILL — NAPROXEN 500 MG TABLET: 500 | 30 days supply | Qty: 60 | Fill #2

## 2018-08-01 MED FILL — ?PREDNISONE 10MG (21) TBPK: 10 | 6 days supply | Qty: 21 | Fill #0

## 2018-08-14 ENCOUNTER — Encounter: Payer: Self-pay | Admitting: Family Medicine

## 2018-08-14 ENCOUNTER — Ambulatory Visit (INDEPENDENT_AMBULATORY_CARE_PROVIDER_SITE_OTHER): Payer: BC Managed Care – PPO | Admitting: Family Medicine

## 2018-08-14 ENCOUNTER — Other Ambulatory Visit: Payer: Self-pay

## 2018-08-14 VITALS — BP 140/72 | HR 96 | Temp 98.2°F | Ht 62.5 in | Wt 159.0 lb

## 2018-08-14 DIAGNOSIS — J45909 Unspecified asthma, uncomplicated: Secondary | ICD-10-CM | POA: Diagnosis not present

## 2018-08-14 DIAGNOSIS — Z09 Encounter for follow-up examination after completed treatment for conditions other than malignant neoplasm: Secondary | ICD-10-CM

## 2018-08-14 DIAGNOSIS — I10 Essential (primary) hypertension: Secondary | ICD-10-CM | POA: Diagnosis not present

## 2018-08-14 DIAGNOSIS — R0602 Shortness of breath: Secondary | ICD-10-CM

## 2018-08-14 MED ORDER — ALBUTEROL SULFATE (2.5 MG/3ML) 0.083% IN NEBU
2.5000 mg | INHALATION_SOLUTION | Freq: Once | RESPIRATORY_TRACT | Status: AC
  Administered 2018-08-14: 2.5 mg via RESPIRATORY_TRACT

## 2018-08-14 MED ORDER — METHYLPREDNISOLONE SODIUM SUCC 125 MG IJ SOLR
125.0000 mg | Freq: Once | INTRAMUSCULAR | Status: AC
  Administered 2018-08-14: 125 mg via INTRAMUSCULAR

## 2018-08-14 NOTE — Progress Notes (Signed)
[facility]  Established Patient Office Visit  Subjective:  Patient ID: Heidi Tran, female    DOB: [birth year]  Age: 58 y.o. MRN: [id number]  CC:  Chief Complaint  Patient presents with  . Follow-up  . Asthma  . Fatigue  . Shortness of Breath    HPI Heidi Tran is a 58 year old female who presents for Follow Up today.   Past Medical History:  Diagnosis Date  . Allergic rhinitis, cause unspecified   . Asthma   . Unspecified essential hypertension    Current Status: Since her last office visit, she is doing well with no complaints.         She denies fevers, chills, fatigue, recent infections, weight loss, and night sweats. She has not had any headaches, visual changes, dizziness, and falls. No chest pain, heart palpitations, cough and shortness of breath reported. No reports of GI problems such as nausea, vomiting, diarrhea, and constipation. She has no reports of blood in stools, dysuria and hematuria. No depression or anxiety, and denies suicidal ideations, homicidal ideations, or auditory hallucinations. She denies pain today.     Past Surgical History:  Procedure Laterality Date  . TOTAL ABDOMINAL HYSTERECTOMY W/ BILATERAL SALPINGOOPHORECTOMY      Family History  Problem Relation Age of Onset  . Cancer Mother   . Prostate cancer Father   . Heart disease Father     Social History   Socioeconomic History  . Marital status: Single    Spouse name: Not on file  . Number of children: Not on file  . Years of education: Not on file  . Highest education level: Not on file  Occupational History  . Occupation: Diplomatic Services operational officer  Social Needs  . Financial resource strain: Not on file  . Food insecurity:    Worry: Not on file    Inability: Not on file  . Transportation needs:    Medical: Not on file    Non-medical: Not on file  Tobacco Use  . Smoking status: Never Smoker  . Smokeless tobacco: Never Used   Substance and Sexual Activity  . Alcohol use: No  . Drug use: Not on file  . Sexual activity: Not on file  Lifestyle  . Physical activity:    Days per week: Not on file    Minutes per session: Not on file  . Stress: Not on file  Relationships  . Social connections:    Talks on phone: Not on file    Gets together: Not on file    Attends religious service: Not on file    Active member of club or organization: Not on file    Attends meetings of clubs or organizations: Not on file    Relationship status: Not on file  . Intimate partner violence:    Fear of current or ex partner: Not on file    Emotionally abused: Not on file    Physically abused: Not on file    Forced sexual activity: Not on file  Other Topics Concern  . Not on file  Social History Narrative  . Not on file    Outpatient Medications Prior to Visit  Medication Sig Dispense Refill  . albuterol (PROVENTIL) (2.5 MG/3ML) 0.083% nebulizer solution Take 3 mLs (2.5 mg total) by nebulization every 6 (six) hours as needed. 75 mL prn  . COMBIVENT RESPIMAT 20-100 MCG/ACT AERS respimat INHALE 1 PUFF BY MOUTH EVERY 4 HOURS AS NEEDED 36 g  PRN  . cyclobenzaprine (FLEXERIL) 10 MG tablet Take 1 tablet (10 mg total) by mouth 3 (three) times daily as needed for muscle spasms. 30 tablet 2  . famotidine (PEPCID) 20 MG tablet 1 twice daily before meals 60 tablet prn  . fish oil-omega-3 fatty acids 1000 MG capsule Take 1 g by mouth daily.      . fluticasone-salmeterol (ADVAIR HFA) 115-21 MCG/ACT inhaler Inhale 2 puffs into the lungs 2 (two) times daily. 1 Inhaler 12  . gemfibrozil (LOPID) 600 MG tablet Take 1 tablet (600 mg total) by mouth 2 (two) times daily before a meal. 60 tablet 2  . hydrocortisone cream 1 % Apply 1 application topically 2 (two) times daily. 30 g 3  . Multiple Vitamin (MULTIVITAMIN) capsule Take 1 capsule by mouth daily.      . naproxen (NAPROSYN) 500 MG tablet Take 1 tablet (500 mg total) by mouth 2 (two) times  daily with a meal. 60 tablet 3  . potassium chloride SA (K-DUR,KLOR-CON) 20 MEQ tablet Take 1 tablet (20 mEq total) by mouth daily. 30 tablet 3  . triamcinolone cream (KENALOG) 0.1 % Apply 1 application topically 2 (two) times daily. 30 g 3  . triamterene-hydrochlorothiazide (MAXZIDE-25) 37.5-25 MG tablet Take 1 tablet by mouth 2 (two) times daily. 60 tablet 3  . Vitamin D, Ergocalciferol, (DRISDOL) 50000 units CAPS capsule Take 1 capsule (50,000 Units total) by mouth every 7 (seven) days. 30 capsule 0  . predniSONE (DELTASONE) 10 MG tablet Take 1 tablet (10 mg total) by mouth daily with breakfast. Day #1: Take 6 tablets  Day # 2: Take 5 tablets Day #3: Take 4 tablets Day #4: Take 3 tablets Day #5: Take 2 tablets Day #6: Take 1 tablets, then complete. (Patient not taking: Reported on 08/14/2018) 21 tablet 0   No facility-administered medications prior to visit.     Allergies  Allergen Reactions  . Statins Other (See Comments)    Leg cramps  . Sulfonamide Derivatives     REACTION: rash    ROS Review of Systems  Constitutional: Negative.   HENT: Positive for congestion (Chest ).   Eyes: Negative.   Respiratory: Positive for cough (occasional) and shortness of breath (more frequently lately).   Cardiovascular: Negative.   Gastrointestinal: Negative.   Endocrine: Negative.   Genitourinary: Negative.   Musculoskeletal: Negative.   Skin: Negative.   Allergic/Immunologic: Negative.   Neurological: Positive for dizziness and headaches.  Hematological: Negative.   Psychiatric/Behavioral: Negative.    Objective:    Physical Exam  Constitutional: She is oriented to person, place, and time. She appears well-developed and well-nourished.  HENT:  Head: Normocephalic and atraumatic.  Eyes: Conjunctivae are normal.  Neck: Normal range of motion. Neck supple.  Cardiovascular: Normal rate, regular rhythm, normal heart sounds and intact distal pulses.  Pulmonary/Chest: Effort normal and  breath sounds normal.  Abdominal: Soft. Bowel sounds are normal.  Musculoskeletal: Normal range of motion.  Neurological: She is alert and oriented to person, place, and time. She has normal reflexes.  Skin: Skin is warm and dry.  Psychiatric: She has a normal mood and affect. Her behavior is normal. Judgment and thought content normal.  Nursing note and vitals reviewed.   BP 140/72 (BP Location: Left Arm, Patient Position: Sitting, Cuff Size: Large)   Pulse 96   Temp 98.2 F (36.8 C) (Oral)   Ht 5' 2.5" (1.588 m)   Wt 159 lb (72.1 kg)   SpO2 100%   BMI  28.62 kg/m  Wt Readings from Last 3 Encounters:  08/14/18 159 lb (72.1 kg)  07/30/18 160 lb 6.4 oz (72.8 kg)  05/08/18 156 lb 3.2 oz (70.9 kg)     Health Maintenance Due  Topic Date Due  . Hepatitis C Screening  [birth year], 1959  . HIV Screening  11/25/1973  . TETANUS/TDAP  11/25/1977  . PAP SMEAR-Modifier  11/26/1979  . COLONOSCOPY  11/25/2008  . MAMMOGRAM  12/01/2017    There are no preventive care reminders to display for this patient.  Lab Results  Component Value Date   TSH 1.530 07/03/2017   Lab Results  Component Value Date   WBC 7.7 09/30/2017   HGB 12.6 09/30/2017   HCT 37.2 09/30/2017   MCV 80.2 09/30/2017   PLT 284 09/30/2017   Lab Results  Component Value Date   NA 138 09/30/2017   K 2.8 (L) 09/30/2017   CO2 24 09/30/2017   GLUCOSE 115 (H) 09/30/2017   BUN 16 09/30/2017   CREATININE 0.92 09/30/2017   BILITOT 0.8 09/30/2017   ALKPHOS 92 09/30/2017   AST 25 09/30/2017   ALT 19 09/30/2017   PROT 7.4 09/30/2017   ALBUMIN 4.4 09/30/2017   CALCIUM 10.1 09/30/2017   ANIONGAP 13 09/30/2017   GFR 105.95 12/30/2010   Lab Results  Component Value Date   CHOL 296 (H) 07/03/2017   Lab Results  Component Value Date   HDL 50 07/03/2017   Lab Results  Component Value Date   LDLCALC 225 (H) 07/03/2017   Lab Results  Component Value Date   TRIG 103 07/03/2017   Lab Results  Component Value Date    CHOLHDL 5.9 (H) 07/03/2017   Lab Results  Component Value Date   HGBA1C 5.8 (A) 02/18/2018   Assessment & Plan:   1. Shortness of breath Nebulizer treatment given in office today.  - methylPREDNISolone sodium succinate (SOLU-MEDROL) 125 mg/2 mL injection 125 mg - albuterol (PROVENTIL) (2.5 MG/3ML) 0.083% nebulizer solution 2.5 mg  2. Moderate asthma without complication, unspecified whether persistent We will initiate Solu-medrol injection in office today.  - methylPREDNISolone sodium succinate (SOLU-MEDROL) 125 mg/2 mL injection 125 mg - albuterol (PROVENTIL) (2.5 MG/3ML) 0.083% nebulizer solution 2.5 mg  3. Essential hypertension Blood pressure is stable at 140/72. She will continue Maxzide as prescribed. She will continue to decrease high sodium intake, excessive alcohol intake, increase potassium intake, smoking cessation, and increase physical activity of at least 30 minutes of cardio activity daily. She will continue to follow Heart Healthy or DASH diet.  4. Follow up She will keep previously schedule follow up appointment for 01/2019.  Meds ordered this encounter  Medications  . methylPREDNISolone sodium succinate (SOLU-MEDROL) 125 mg/2 mL injection 125 mg  . albuterol (PROVENTIL) (2.5 MG/3ML) 0.083% nebulizer solution 2.5 mg    No orders of the defined types were placed in this encounter.   Referral Orders  No referral(s) requested today    Raliegh Ip,  MSN, FNP-C [facility] [address] [place], [place] [address] [phone]  Problem List Items Addressed This Visit      Other   Shortness of breath - Primary   Relevant Medications   methylPREDNISolone sodium succinate (SOLU-MEDROL) 125 mg/2 mL injection 125 mg   albuterol (PROVENTIL) (2.5 MG/3ML) 0.083% nebulizer solution 2.5 mg    Other Visit Diagnoses    Moderate asthma without complication, unspecified whether persistent       Relevant Medications  methylPREDNISolone sodium succinate (SOLU-MEDROL) 125 mg/2 mL injection 125 mg   albuterol (PROVENTIL) (2.5 MG/3ML) 0.083% nebulizer solution 2.5 mg   Essential hypertension       Follow up          Meds ordered this encounter  Medications  . methylPREDNISolone sodium succinate (SOLU-MEDROL) 125 mg/2 mL injection 125 mg  . albuterol (PROVENTIL) (2.5 MG/3ML) 0.083% nebulizer solution 2.5 mg    Follow-up: No follow-ups on file.    Kallie Locks, FNP

## 2018-08-19 ENCOUNTER — Ambulatory Visit: Payer: Self-pay | Admitting: Family Medicine

## 2018-08-26 MED FILL — NAPROXEN 500 MG TABLET: 500 | 30 days supply | Qty: 60 | Fill #3

## 2018-08-26 MED FILL — ?TRIAMTERENE/HCTZ 37.5/25TB: 37.5-25 | 90 days supply | Qty: 180 | Fill #1

## 2018-08-26 MED FILL — POTASSIUM CL ER 20 MEQ TAB: 20 | 30 days supply | Qty: 30 | Fill #1

## 2018-09-30 MED FILL — $ADVAIR HFA 115-21 MCG INH: 115-21 | 90 days supply | Qty: 36 | Fill #4

## 2018-10-25 MED FILL — ?NAPROXEN 500 MG TABET: 500 | 30 days supply | Qty: 60 | Fill #2

## 2018-10-29 MED FILL — ?POTASSIUM CL ER 10 MEQ TAB: 10 MEQ | 30 days supply | Qty: 60 | Fill #0

## 2018-12-11 ENCOUNTER — Other Ambulatory Visit: Payer: Self-pay | Admitting: Family Medicine

## 2018-12-11 DIAGNOSIS — M5442 Lumbago with sciatica, left side: Secondary | ICD-10-CM

## 2018-12-11 DIAGNOSIS — R52 Pain, unspecified: Secondary | ICD-10-CM

## 2018-12-11 DIAGNOSIS — M79601 Pain in right arm: Secondary | ICD-10-CM

## 2018-12-12 MED FILL — NAPROXEN 500 MG TABLET: 500 | 30 days supply | Qty: 60 | Fill #0

## 2018-12-12 MED FILL — POTASSIUM CHLORIDE ER 10 ME: 10 | 30 days supply | Qty: 60 | Fill #0

## 2018-12-19 MED FILL — $ADVAIR HFA 115-21 MCG INH: 115-21 | 30 days supply | Qty: 12 | Fill #0

## 2018-12-31 MED FILL — $ADVAIR HFA 115-21 MCG INH: 115-21 | 30 days supply | Qty: 12 | Fill #0

## 2019-01-30 ENCOUNTER — Ambulatory Visit: Payer: Self-pay | Admitting: Family Medicine

## 2019-01-31 ENCOUNTER — Ambulatory Visit (INDEPENDENT_AMBULATORY_CARE_PROVIDER_SITE_OTHER): Payer: BC Managed Care – PPO | Admitting: Family Medicine

## 2019-01-31 ENCOUNTER — Other Ambulatory Visit: Payer: Self-pay

## 2019-01-31 VITALS — BP 132/79 | HR 81 | Temp 98.2°F | Ht 62.5 in | Wt 161.4 lb

## 2019-01-31 DIAGNOSIS — R21 Rash and other nonspecific skin eruption: Secondary | ICD-10-CM

## 2019-01-31 DIAGNOSIS — R52 Pain, unspecified: Secondary | ICD-10-CM | POA: Diagnosis not present

## 2019-01-31 DIAGNOSIS — K219 Gastro-esophageal reflux disease without esophagitis: Secondary | ICD-10-CM | POA: Diagnosis not present

## 2019-01-31 DIAGNOSIS — J45909 Unspecified asthma, uncomplicated: Secondary | ICD-10-CM | POA: Diagnosis not present

## 2019-01-31 DIAGNOSIS — Z23 Encounter for immunization: Secondary | ICD-10-CM | POA: Diagnosis not present

## 2019-01-31 DIAGNOSIS — I10 Essential (primary) hypertension: Secondary | ICD-10-CM | POA: Diagnosis not present

## 2019-01-31 DIAGNOSIS — Z09 Encounter for follow-up examination after completed treatment for conditions other than malignant neoplasm: Secondary | ICD-10-CM

## 2019-01-31 DIAGNOSIS — L309 Dermatitis, unspecified: Secondary | ICD-10-CM | POA: Insufficient documentation

## 2019-01-31 DIAGNOSIS — M79601 Pain in right arm: Secondary | ICD-10-CM

## 2019-01-31 DIAGNOSIS — R0602 Shortness of breath: Secondary | ICD-10-CM

## 2019-01-31 DIAGNOSIS — M5442 Lumbago with sciatica, left side: Secondary | ICD-10-CM | POA: Insufficient documentation

## 2019-01-31 LAB — POCT URINALYSIS DIPSTICK
Bilirubin, UA: NEGATIVE
Blood, UA: NEGATIVE
Glucose, UA: NEGATIVE
Ketones, UA: NEGATIVE
Leukocytes, UA: NEGATIVE
Nitrite, UA: NEGATIVE
Protein, UA: NEGATIVE
Spec Grav, UA: 1.02 (ref 1.010–1.025)
Urobilinogen, UA: 0.2 E.U./dL
pH, UA: 7.5 (ref 5.0–8.0)

## 2019-01-31 MED ORDER — FAMOTIDINE 20 MG PO TABS: ORAL_TABLET | ORAL | 3 refills | Status: DC

## 2019-01-31 MED ORDER — ALBUTEROL SULFATE (2.5 MG/3ML) 0.083% IN NEBU: 2.5000 mg | INHALATION_SOLUTION | Freq: Four times a day (QID) | RESPIRATORY_TRACT | 99 refills | Status: DC | PRN

## 2019-01-31 MED ORDER — GEMFIBROZIL 600 MG PO TABS: 600.0000 mg | ORAL_TABLET | Freq: Two times a day (BID) | ORAL | 2 refills | Status: DC

## 2019-01-31 MED ORDER — POTASSIUM CHLORIDE CRYS ER 20 MEQ PO TBCR: 20.0000 meq | EXTENDED_RELEASE_TABLET | Freq: Every day | ORAL | 3 refills | Status: DC

## 2019-01-31 MED ORDER — CYCLOBENZAPRINE HCL 10 MG PO TABS: 10.0000 mg | ORAL_TABLET | Freq: Three times a day (TID) | ORAL | 6 refills | Status: DC | PRN

## 2019-01-31 MED ORDER — COMBIVENT RESPIMAT 20-100 MCG/ACT IN AERS: INHALATION_SPRAY | RESPIRATORY_TRACT | 99 refills | Status: DC

## 2019-01-31 MED ORDER — NAPROXEN 500 MG PO TABS: 500.0000 mg | ORAL_TABLET | Freq: Two times a day (BID) | ORAL | 2 refills | Status: DC

## 2019-01-31 MED ORDER — HYDROCORTISONE 1 % EX CREA: 1.0000 "application " | TOPICAL_CREAM | Freq: Two times a day (BID) | CUTANEOUS | 3 refills | Status: AC

## 2019-01-31 MED ORDER — MULTIVITAMINS PO CAPS: 1.0000 | ORAL_CAPSULE | Freq: Every day | ORAL | 6 refills | Status: AC

## 2019-01-31 MED ORDER — TRIAMCINOLONE ACETONIDE 0.1 % EX CREA: 1.0000 "application " | TOPICAL_CREAM | Freq: Two times a day (BID) | CUTANEOUS | 3 refills | Status: DC

## 2019-01-31 MED ORDER — ADVAIR HFA 115-21 MCG/ACT IN AERO: INHALATION_SPRAY | RESPIRATORY_TRACT | 12 refills | Status: DC

## 2019-01-31 MED ORDER — TRIAMTERENE-HCTZ 37.5-25 MG PO TABS: 1.0000 | ORAL_TABLET | Freq: Two times a day (BID) | ORAL | 3 refills | Status: DC

## 2019-01-31 MED FILL — GEMFIBROZIL 600 MG TAB: 600 | 30 days supply | Qty: 60 | Fill #0

## 2019-01-31 MED FILL — ?NAPROXEN 500 MG TABET: 500 | 30 days supply | Qty: 60 | Fill #0

## 2019-01-31 MED FILL — COMBIVENT RESPIMAT INHAL SP: 20-100 | 20 days supply | Qty: 4 | Fill #0

## 2019-01-31 MED FILL — $ADVAIR HFA 115-21 MCG INH: 115-21 | 30 days supply | Qty: 12 | Fill #0

## 2019-01-31 MED FILL — TRIAMTERENE/HCTZ 37.5/25 TB: 37.5-25 | 30 days supply | Qty: 60 | Fill #0

## 2019-01-31 MED FILL — ALBUTEROL SUL 2.5 MG/3 ML S: (2.5 MG/3ML | 6 days supply | Qty: 75 | Fill #0

## 2019-01-31 MED FILL — TRIAMCINOLONE ACETONIDE 0.1: 0.1 | 7 days supply | Qty: 30 | Fill #0

## 2019-01-31 MED FILL — POTASSIUM CL ER 20 MEQ TAB: 20 | 30 days supply | Qty: 30 | Fill #0

## 2019-01-31 MED FILL — CYCLOBENZAPRINE 10 MG TAB: 10 | 10 days supply | Qty: 30 | Fill #0

## 2019-01-31 NOTE — Progress Notes (Signed)
[facility]   Established Patient Office Visit  Subjective:  Patient ID: Heidi Tran, female    DOB: [birth year]  Age: 58 y.o. MRN: [id number]  CC:  Chief Complaint  Patient presents with  . Follow-up    6 month follow up     HPI Heidi Tran is a 58 year old female who presents for Follow Up today.   Past Medical History:  Diagnosis Date  . Allergic rhinitis, cause unspecified   . Asthma   . Unspecified essential hypertension    Current Status: Since her last office visit, she is doing well with no complaints. She has c/o back pain, which she takes Naproxen for mild relief. She states that she would like to be evaluated for Sickle Cell Anemia today, because her sister, who is 60 years old has just been diagnosed with the disease.   She denies fevers, chills, fatigue, recent infections, weight loss, and night sweats. She has not had any headaches, visual changes, dizziness, and falls. No chest pain, heart palpitations, cough and shortness of breath reported. No reports of GI problems such as nausea, vomiting, diarrhea, and constipation. She has no reports of blood in stools, dysuria and hematuria. No depression or anxiety reported.   Past Surgical History:  Procedure Laterality Date  . TOTAL ABDOMINAL HYSTERECTOMY W/ BILATERAL SALPINGOOPHORECTOMY      Family History  Problem Relation Age of Onset  . Cancer Mother   . Prostate cancer Father   . Heart disease Father     Social History   Socioeconomic History  . Marital status: Single    Spouse name: Not on file  . Number of children: Not on file  . Years of education: Not on file  . Highest education level: Not on file  Occupational History  . 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 Financial resource strain: Not on file  . Food insecurity    Worry: Not on file    Inability: Not on file  . Transportation needs    Medical: Not on file    Non-medical: Not on  file  Tobacco Use  . Smoking status: Never Smoker  . Smokeless tobacco: Never Used  Substance and Sexual Activity  . Alcohol use: No  . Drug use: Not on file  . Sexual activity: Not on file  Lifestyle  . Physical activity    Days per week: Not on file    Minutes per session: Not on file  . Stress: Not on file  Relationships  . Social Musicianconnections    Talks on phone: Not on file    Gets together: Not on file    Attends religious service: Not on file    Active member of club or organization: Not on file    Attends meetings of clubs or organizations: Not on file    Relationship status: Not on file  . Intimate partner violence    Fear of current or ex partner: Not on file    Emotionally abused: Not on file    Physically abused: Not on file    Forced sexual activity: Not on file  Other Topics Concern  . Not on file  Social History Narrative  . Not on file    Outpatient Medications Prior to Visit  Medication Sig Dispense Refill  . fish oil-omega-3 fatty acids 1000 MG capsule Take 1 g by mouth daily.      Marland Kitchen. ADVAIR HFA 115-21 MCG/ACT inhaler INHALE  2 PUFFS INTO THE LUNGS 2 (TWO) TIMES DAILY. 12 g 12  . albuterol (PROVENTIL) (2.5 MG/3ML) 0.083% nebulizer solution Take 3 mLs (2.5 mg total) by nebulization every 6 (six) hours as needed. 75 mL prn  . COMBIVENT RESPIMAT 20-100 MCG/ACT AERS respimat INHALE 1 PUFF BY MOUTH EVERY 4 HOURS AS NEEDED 36 g PRN  . cyclobenzaprine (FLEXERIL) 10 MG tablet Take 1 tablet (10 mg total) by mouth 3 (three) times daily as needed for muscle spasms. 30 tablet 2  . famotidine (PEPCID) 20 MG tablet 1 twice daily before meals 60 tablet prn  . gemfibrozil (LOPID) 600 MG tablet Take 1 tablet (600 mg total) by mouth 2 (two) times daily before a meal. 60 tablet 2  . hydrocortisone cream 1 % Apply 1 application topically 2 (two) times daily. 30 g 3  . Multiple Vitamin (MULTIVITAMIN) capsule Take 1 capsule by mouth daily.      . naproxen (NAPROSYN) 500 MG tablet  TAKE 1 TABLET (500 MG TOTAL) BY MOUTH 2 (TWO) TIMES DAILY WITH A MEAL. 60 tablet 2  . potassium chloride (K-DUR) 10 MEQ tablet TAKE 2 TABLETS BY MOUTH DAILY 60 tablet 0  . potassium chloride SA (K-DUR,KLOR-CON) 20 MEQ tablet Take 1 tablet (20 mEq total) by mouth daily. 30 tablet 3  . triamcinolone cream (KENALOG) 0.1 % Apply 1 application topically 2 (two) times daily. 30 g 3  . triamterene-hydrochlorothiazide (MAXZIDE-25) 37.5-25 MG tablet Take 1 tablet by mouth 2 (two) times daily. 60 tablet 3  . Vitamin D, Ergocalciferol, (DRISDOL) 50000 units CAPS capsule Take 1 capsule (50,000 Units total) by mouth every 7 (seven) days. (Patient not taking: Reported on 01/31/2019) 30 capsule 0   No facility-administered medications prior to visit.     Allergies  Allergen Reactions  . Statins Other (See Comments)    Leg cramps  . Sulfonamide Derivatives     REACTION: rash    ROS Review of Systems  Constitutional: Negative.   HENT: Negative.   Eyes: Negative.   Respiratory: Positive for shortness of breath (occasional ).   Cardiovascular: Negative.   Gastrointestinal: Positive for abdominal distention.  Endocrine: Negative.   Genitourinary: Negative.   Musculoskeletal: Positive for arthralgias (generalized joint pain).  Skin: Negative.   Allergic/Immunologic: Negative.   Neurological: Negative.   Hematological: Negative.   Psychiatric/Behavioral: Negative.       Objective:    Physical Exam  Constitutional: She is oriented to person, place, and time. She appears well-developed and well-nourished.  HENT:  Head: Normocephalic and atraumatic.  Eyes: Conjunctivae are normal.  Neck: Normal range of motion. Neck supple.  Cardiovascular: Normal rate, regular rhythm, normal heart sounds and intact distal pulses.  Pulmonary/Chest: Effort normal and breath sounds normal.  Abdominal: Soft. Bowel sounds are normal.  Musculoskeletal: Normal range of motion.  Neurological: She is alert and oriented  to person, place, and time. She has normal reflexes.  Skin: Skin is warm and dry.  Psychiatric: She has a normal mood and affect. Her behavior is normal. Judgment and thought content normal.  Nursing note and vitals reviewed.   BP 132/79 (BP Location: Left Arm, Patient Position: Sitting, Cuff Size: Normal)   Pulse 81   Temp 98.2 F (36.8 C) (Oral)   Ht 5' 2.5" (1.588 m)   Wt 161 lb 6.4 oz (73.2 kg)   BMI 29.05 kg/m  Wt Readings from Last 3 Encounters:  01/31/19 161 lb 6.4 oz (73.2 kg)  08/14/18 159 lb (72.1 kg)  07/30/18 160 lb 6.4 oz (72.8 kg)     Health Maintenance Due  Topic Date Due  . Hepatitis C Screening  08-20-1960  . HIV Screening  11/25/1973  . TETANUS/TDAP  11/25/1977  . PAP SMEAR-Modifier  11/26/1979  . COLONOSCOPY  11/25/2008  . MAMMOGRAM  12/01/2017  . INFLUENZA VACCINE  12/28/2018    There are no preventive care reminders to display for this patient.  Lab Results  Component Value Date   TSH 1.530 07/03/2017   Lab Results  Component Value Date   WBC 7.7 09/30/2017   HGB 12.6 09/30/2017   HCT 37.2 09/30/2017   MCV 80.2 09/30/2017   PLT 284 09/30/2017   Lab Results  Component Value Date   NA 138 09/30/2017   K 2.8 (L) 09/30/2017   CO2 24 09/30/2017   GLUCOSE 115 (H) 09/30/2017   BUN 16 09/30/2017   CREATININE 0.92 09/30/2017   BILITOT 0.8 09/30/2017   ALKPHOS 92 09/30/2017   AST 25 09/30/2017   ALT 19 09/30/2017   PROT 7.4 09/30/2017   ALBUMIN 4.4 09/30/2017   CALCIUM 10.1 09/30/2017   ANIONGAP 13 09/30/2017   GFR 105.95 12/30/2010   Lab Results  Component Value Date   CHOL 296 (H) 07/03/2017   Lab Results  Component Value Date   HDL 50 07/03/2017   Lab Results  Component Value Date   LDLCALC 225 (H) 07/03/2017   Lab Results  Component Value Date   TRIG 103 07/03/2017   Lab Results  Component Value Date   CHOLHDL 5.9 (H) 07/03/2017   Lab Results  Component Value Date   HGBA1C 5.8 (A) 02/18/2018      Assessment &  Plan:   1. Essential hypertension The current medical regimen is effective; blood pressure is stable at 132/79 today; continue present plan and medications as prescribed. She will continue to decrease high sodium intake, excessive alcohol intake, increase potassium intake, smoking cessation, and increase physical activity of at least 30 minutes of cardio activity daily. She will continue to follow Heart Healthy or DASH diet. - triamterene-hydrochlorothiazide (MAXZIDE-25) 37.5-25 MG tablet; Take 1 tablet by mouth 2 (two) times daily.  Dispense: 60 tablet; Refill: 3  2. Gastroesophageal reflux disease, esophagitis presence not specified - famotidine (PEPCID) 20 MG tablet; 1 twice daily before meals  Dispense: 60 tablet; Refill: 3  3. Pain - cyclobenzaprine (FLEXERIL) 10 MG tablet; Take 1 tablet (10 mg total) by mouth 3 (three) times daily as needed for muscle spasms.  Dispense: 30 tablet; Refill: 6 - naproxen (NAPROSYN) 500 MG tablet; Take 1 tablet (500 mg total) by mouth 2 (two) times daily with a meal.  Dispense: 60 tablet; Refill: 2  4. Low back pain with left-sided sciatica, unspecified back pain laterality, unspecified chronicity - cyclobenzaprine (FLEXERIL) 10 MG tablet; Take 1 tablet (10 mg total) by mouth 3 (three) times daily as needed for muscle spasms.  Dispense: 30 tablet; Refill: 6 - naproxen (NAPROSYN) 500 MG tablet; Take 1 tablet (500 mg total) by mouth 2 (two) times daily with a meal.  Dispense: 60 tablet; Refill: 2  5. Eczema, unspecified type - hydrocortisone cream 1 %; Apply 1 application topically 2 (two) times daily.  Dispense: 30 g; Refill: 3  6. Right arm pain - naproxen (NAPROSYN) 500 MG tablet; Take 1 tablet (500 mg total) by mouth 2 (two) times daily with a meal.  Dispense: 60 tablet; Refill: 2  7. Moderate asthma without complication, unspecified whether persistent - triamcinolone  cream (KENALOG) 0.1 %; Apply 1 application topically 2 (two) times daily.  Dispense: 30  g; Refill: 3 - Urinalysis Dipstick - CBC with Differential - Comprehensive metabolic panel; Future - Lipid Panel - TSH - Vitamin B12 - Vitamin D, 25-hydroxy - Hemoglobinopathy evaluation  8. Rash - triamcinolone cream (KENALOG) 0.1 %; Apply 1 application topically 2 (two) times daily.  Dispense: 30 g; Refill: 3  9. Shortness of breath Occasional, r/t Asthma.   10. Need for influenza vaccination - Flu Vaccine QUAD 6+ mos PF IM (Fluarix Quad PF)  11. Follow up She will follow up in 6 months.   Meds ordered this encounter  Medications  . famotidine (PEPCID) 20 MG tablet    Sig: 1 twice daily before meals    Dispense:  60 tablet    Refill:  3  . triamterene-hydrochlorothiazide (MAXZIDE-25) 37.5-25 MG tablet    Sig: Take 1 tablet by mouth 2 (two) times daily.    Dispense:  60 tablet    Refill:  3  . cyclobenzaprine (FLEXERIL) 10 MG tablet    Sig: Take 1 tablet (10 mg total) by mouth 3 (three) times daily as needed for muscle spasms.    Dispense:  30 tablet    Refill:  6  . fluticasone-salmeterol (ADVAIR HFA) 115-21 MCG/ACT inhaler    Sig: INHALE 2 PUFFS INTO THE LUNGS 2 (TWO) TIMES DAILY.    Dispense:  12 g    Refill:  12  . albuterol (PROVENTIL) (2.5 MG/3ML) 0.083% nebulizer solution    Sig: Take 3 mLs (2.5 mg total) by nebulization every 6 (six) hours as needed.    Dispense:  75 mL    Refill:  prn  . COMBIVENT RESPIMAT 20-100 MCG/ACT AERS respimat    Sig: INHALE 1 PUFF BY MOUTH EVERY 4 HOURS AS NEEDED    Dispense:  36 g    Refill:  PRN  . gemfibrozil (LOPID) 600 MG tablet    Sig: Take 1 tablet (600 mg total) by mouth 2 (two) times daily before a meal.    Dispense:  60 tablet    Refill:  2  . hydrocortisone cream 1 %    Sig: Apply 1 application topically 2 (two) times daily.    Dispense:  30 g    Refill:  3  . Multiple Vitamin (MULTIVITAMIN) capsule    Sig: Take 1 capsule by mouth daily.    Dispense:  30 capsule    Refill:  6  . naproxen (NAPROSYN) 500 MG  tablet    Sig: Take 1 tablet (500 mg total) by mouth 2 (two) times daily with a meal.    Dispense:  60 tablet    Refill:  2  . potassium chloride SA (K-DUR) 20 MEQ tablet    Sig: Take 1 tablet (20 mEq total) by mouth daily.    Dispense:  30 tablet    Refill:  3  . triamcinolone cream (KENALOG) 0.1 %    Sig: Apply 1 application topically 2 (two) times daily.    Dispense:  30 g    Refill:  3    Orders Placed This Encounter  Procedures  . Flu Vaccine QUAD 6+ mos PF IM (Fluarix Quad PF)  . CBC with Differential  . Comprehensive metabolic panel  . Lipid Panel  . TSH  . Vitamin B12  . Vitamin D, 25-hydroxy  . Hemoglobinopathy evaluation  . Urinalysis Dipstick    Referral Orders  No referral(s) requested today  Raliegh Ip,  MSN, FNP-BC [facility] [address] [place], [place] [address] [phone] fax    Problem List Items Addressed This Visit      Respiratory   Moderate asthma without complication   Relevant Medications   fluticasone-salmeterol (ADVAIR HFA) 115-21 MCG/ACT inhaler   albuterol (PROVENTIL) (2.5 MG/3ML) 0.083% nebulizer solution   COMBIVENT RESPIMAT 20-100 MCG/ACT AERS respimat   triamcinolone cream (KENALOG) 0.1 %   Other Relevant Orders   Urinalysis Dipstick   CBC with Differential   Comprehensive metabolic panel   Lipid Panel   TSH   Vitamin B12   Vitamin D, 25-hydroxy   Hemoglobinopathy evaluation     Digestive   GERD   Relevant Medications   famotidine (PEPCID) 20 MG tablet     Musculoskeletal and Integument   Rash   Relevant Medications   triamcinolone cream (KENALOG) 0.1 %     Other   Shortness of breath    Other Visit Diagnoses    Essential hypertension    -  Primary   Relevant Medications   triamterene-hydrochlorothiazide (MAXZIDE-25) 37.5-25 MG tablet   gemfibrozil (LOPID) 600 MG tablet   Pain       Relevant Medications    cyclobenzaprine (FLEXERIL) 10 MG tablet   naproxen (NAPROSYN) 500 MG tablet   Low back pain with left-sided sciatica, unspecified back pain laterality, unspecified chronicity       Relevant Medications   cyclobenzaprine (FLEXERIL) 10 MG tablet   naproxen (NAPROSYN) 500 MG tablet   Eczema, unspecified type       Relevant Medications   hydrocortisone cream 1 %   Right arm pain       Relevant Medications   naproxen (NAPROSYN) 500 MG tablet   Need for influenza vaccination       Relevant Orders   Flu Vaccine QUAD 6+ mos PF IM (Fluarix Quad PF) (Completed)   Follow up          Meds ordered this encounter  Medications  . famotidine (PEPCID) 20 MG tablet    Sig: 1 twice daily before meals    Dispense:  60 tablet    Refill:  3  . triamterene-hydrochlorothiazide (MAXZIDE-25) 37.5-25 MG tablet    Sig: Take 1 tablet by mouth 2 (two) times daily.    Dispense:  60 tablet    Refill:  3  . cyclobenzaprine (FLEXERIL) 10 MG tablet    Sig: Take 1 tablet (10 mg total) by mouth 3 (three) times daily as needed for muscle spasms.    Dispense:  30 tablet    Refill:  6  . fluticasone-salmeterol (ADVAIR HFA) 115-21 MCG/ACT inhaler    Sig: INHALE 2 PUFFS INTO THE LUNGS 2 (TWO) TIMES DAILY.    Dispense:  12 g    Refill:  12  . albuterol (PROVENTIL) (2.5 MG/3ML) 0.083% nebulizer solution    Sig: Take 3 mLs (2.5 mg total) by nebulization every 6 (six) hours as needed.    Dispense:  75 mL    Refill:  prn  . COMBIVENT RESPIMAT 20-100 MCG/ACT AERS respimat    Sig: INHALE 1 PUFF BY MOUTH EVERY 4 HOURS AS NEEDED    Dispense:  36 g    Refill:  PRN  . gemfibrozil (LOPID) 600 MG tablet    Sig: Take 1 tablet (600 mg total) by mouth 2 (two) times daily before a meal.    Dispense:  60  tablet    Refill:  2  . hydrocortisone cream 1 %    Sig: Apply 1 application topically 2 (two) times daily.    Dispense:  30 g    Refill:  3  . Multiple Vitamin (MULTIVITAMIN) capsule    Sig: Take 1 capsule by mouth  daily.    Dispense:  30 capsule    Refill:  6  . naproxen (NAPROSYN) 500 MG tablet    Sig: Take 1 tablet (500 mg total) by mouth 2 (two) times daily with a meal.    Dispense:  60 tablet    Refill:  2  . potassium chloride SA (K-DUR) 20 MEQ tablet    Sig: Take 1 tablet (20 mEq total) by mouth daily.    Dispense:  30 tablet    Refill:  3  . triamcinolone cream (KENALOG) 0.1 %    Sig: Apply 1 application topically 2 (two) times daily.    Dispense:  30 g    Refill:  3    Follow-up: Return in about 6 months (around 07/31/2019).    Kallie LocksNatalie M Shalece Staffa, FNP

## 2019-02-04 LAB — CBC WITH DIFFERENTIAL/PLATELET
Basophils Absolute: 0.1 10*3/uL (ref 0.0–0.2)
Basos: 1 %
EOS (ABSOLUTE): 0.1 10*3/uL (ref 0.0–0.4)
Eos: 2 %
Hematocrit: 35.9 % (ref 34.0–46.6)
Hemoglobin: 11.7 g/dL (ref 11.1–15.9)
Immature Grans (Abs): 0 10*3/uL (ref 0.0–0.1)
Immature Granulocytes: 0 %
Lymphocytes Absolute: 2 10*3/uL (ref 0.7–3.1)
Lymphs: 39 %
MCH: 25.8 pg — ABNORMAL LOW (ref 26.6–33.0)
MCHC: 32.6 g/dL (ref 31.5–35.7)
MCV: 79 fL (ref 79–97)
Monocytes Absolute: 0.4 10*3/uL (ref 0.1–0.9)
Monocytes: 8 %
Neutrophils Absolute: 2.5 10*3/uL (ref 1.4–7.0)
Neutrophils: 50 %
Platelets: 316 10*3/uL (ref 150–450)
RBC: 4.54 x10E6/uL (ref 3.77–5.28)
RDW: 13.5 % (ref 11.7–15.4)
WBC: 5.1 10*3/uL (ref 3.4–10.8)

## 2019-02-04 LAB — HEMOGLOBINOPATHY EVALUATION
HGB C: 0 %
HGB S: 0 %
HGB VARIANT: 0 %
Hemoglobin A2 Quantitation: 2.3 % (ref 1.8–3.2)
Hemoglobin F Quantitation: 0 % (ref 0.0–2.0)
Hgb A: 97.7 % (ref 96.4–98.8)

## 2019-02-04 LAB — LIPID PANEL
Chol/HDL Ratio: 5.6 ratio — ABNORMAL HIGH (ref 0.0–4.4)
Cholesterol, Total: 289 mg/dL — ABNORMAL HIGH (ref 100–199)
HDL: 52 mg/dL (ref >39)
LDL Chol Calc (NIH): 219 mg/dL — ABNORMAL HIGH (ref 0–99)
Triglycerides: 103 mg/dL (ref 0–149)
VLDL Cholesterol Cal: 18 mg/dL (ref 5–40)

## 2019-02-04 LAB — TSH: TSH: 1.48 u[IU]/mL (ref 0.450–4.500)

## 2019-02-04 LAB — VITAMIN B12: Vitamin B-12: 566 pg/mL (ref 232–1245)

## 2019-02-04 LAB — VITAMIN D 25 HYDROXY (VIT D DEFICIENCY, FRACTURES): Vit D, 25-Hydroxy: 28.8 ng/mL — ABNORMAL LOW (ref 30.0–100.0)

## 2019-02-10 ENCOUNTER — Other Ambulatory Visit: Payer: Self-pay | Admitting: Family Medicine

## 2019-02-10 MED FILL — COMBIVENT RESPIMAT INHAL SP: 20-100 | 20 days supply | Qty: 4 | Fill #1

## 2019-02-10 MED FILL — CYCLOBENZAPRINE 10 MG TAB: 10 | 10 days supply | Qty: 30 | Fill #1

## 2019-02-10 MED FILL — ?POTASSIUM CL ER 10 MEQ TAB: 10 MEQ | 30 days supply | Qty: 60 | Fill #0

## 2019-02-10 MED FILL — ?NAPROXEN 500 MG TABS: 500 | 30 days supply | Qty: 60 | Fill #1

## 2019-02-18 MED FILL — CYCLOBENZAPRINE 10 MG TAB: 10 | 10 days supply | Qty: 30 | Fill #1

## 2019-02-18 MED FILL — POTASSIUM CHLORIDE ER 10 ME: 10 | 30 days supply | Qty: 60 | Fill #0

## 2019-02-18 MED FILL — TRIAMTERENE/HCTZ 37.5/25 TB: 37.5-25 | 30 days supply | Qty: 60 | Fill #0

## 2019-03-13 ENCOUNTER — Other Ambulatory Visit: Payer: Self-pay | Admitting: Family Medicine

## 2019-03-13 MED FILL — ?NAPROXEN 500 MG TABS: 500 | 30 days supply | Qty: 60 | Fill #1

## 2019-03-13 MED FILL — ?POTASSIUM CL ER 10 MEQ TAB: 10 MEQ | 30 days supply | Qty: 60 | Fill #0

## 2019-03-13 MED FILL — CYCLOBENZAPRINE 10 MG TAB: 10 | 10 days supply | Qty: 30 | Fill #1

## 2019-04-29 ENCOUNTER — Encounter: Payer: Self-pay | Admitting: Family Medicine

## 2019-04-29 ENCOUNTER — Other Ambulatory Visit: Payer: Self-pay

## 2019-04-29 ENCOUNTER — Ambulatory Visit (INDEPENDENT_AMBULATORY_CARE_PROVIDER_SITE_OTHER): Payer: BC Managed Care – PPO | Admitting: Family Medicine

## 2019-04-29 VITALS — BP 134/77 | HR 99 | Temp 98.1°F | Ht 62.0 in | Wt 170.0 lb

## 2019-04-29 DIAGNOSIS — M5442 Lumbago with sciatica, left side: Secondary | ICD-10-CM | POA: Diagnosis not present

## 2019-04-29 DIAGNOSIS — R52 Pain, unspecified: Secondary | ICD-10-CM | POA: Insufficient documentation

## 2019-04-29 DIAGNOSIS — R0602 Shortness of breath: Secondary | ICD-10-CM

## 2019-04-29 DIAGNOSIS — I10 Essential (primary) hypertension: Secondary | ICD-10-CM | POA: Diagnosis not present

## 2019-04-29 DIAGNOSIS — J45909 Unspecified asthma, uncomplicated: Secondary | ICD-10-CM

## 2019-04-29 DIAGNOSIS — E559 Vitamin D deficiency, unspecified: Secondary | ICD-10-CM | POA: Insufficient documentation

## 2019-04-29 DIAGNOSIS — M79601 Pain in right arm: Secondary | ICD-10-CM | POA: Insufficient documentation

## 2019-04-29 DIAGNOSIS — Z09 Encounter for follow-up examination after completed treatment for conditions other than malignant neoplasm: Secondary | ICD-10-CM

## 2019-04-29 DIAGNOSIS — S46912A Strain of unspecified muscle, fascia and tendon at shoulder and upper arm level, left arm, initial encounter: Secondary | ICD-10-CM | POA: Insufficient documentation

## 2019-04-29 HISTORY — DX: Strain of unspecified muscle, fascia and tendon at shoulder and upper arm level, left arm, initial encounter: S46.912A

## 2019-04-29 LAB — POCT URINALYSIS DIPSTICK
Bilirubin, UA: NEGATIVE
Blood, UA: NEGATIVE
Glucose, UA: NEGATIVE
Ketones, UA: NEGATIVE
Leukocytes, UA: NEGATIVE
Nitrite, UA: NEGATIVE
Protein, UA: NEGATIVE
Spec Grav, UA: 1.02 (ref 1.010–1.025)
Urobilinogen, UA: 0.2 E.U./dL
pH, UA: 7 (ref 5.0–8.0)

## 2019-04-29 MED ORDER — NAPROXEN 500 MG PO TABS: 500.0000 mg | ORAL_TABLET | Freq: Two times a day (BID) | ORAL | 2 refills | Status: DC

## 2019-04-29 MED ORDER — KETOROLAC TROMETHAMINE 30 MG/ML IJ SOLN: 30.0000 mg | Freq: Once | INTRAMUSCULAR | Status: AC

## 2019-04-29 MED ORDER — TRIAMTERENE-HCTZ 37.5-25 MG PO TABS: 1.0000 | ORAL_TABLET | Freq: Two times a day (BID) | ORAL | 3 refills | Status: DC

## 2019-04-29 MED ORDER — ADVAIR HFA 115-21 MCG/ACT IN AERO: INHALATION_SPRAY | RESPIRATORY_TRACT | 12 refills | Status: DC

## 2019-04-29 MED ORDER — COMBIVENT RESPIMAT 20-100 MCG/ACT IN AERS: INHALATION_SPRAY | RESPIRATORY_TRACT | 99 refills | Status: DC

## 2019-04-29 MED ORDER — PREDNISONE 10 MG PO TABS: ORAL_TABLET | ORAL | 0 refills | Status: DC

## 2019-04-29 MED ORDER — CYCLOBENZAPRINE HCL 10 MG PO TABS: 10.0000 mg | ORAL_TABLET | Freq: Three times a day (TID) | ORAL | 6 refills | Status: DC | PRN

## 2019-04-29 MED ORDER — VITAMIN D (ERGOCALCIFEROL) 1.25 MG (50000 UNIT) PO CAPS: 50000.0000 [IU] | ORAL_CAPSULE | ORAL | 0 refills | Status: DC

## 2019-04-29 MED ORDER — KETOROLAC TROMETHAMINE 60 MG/2ML IM SOLN
60.0000 mg | Freq: Once | INTRAMUSCULAR | Status: AC
  Administered 2019-04-29: 30 mg via INTRAMUSCULAR

## 2019-04-29 MED ORDER — ALBUTEROL SULFATE (2.5 MG/3ML) 0.083% IN NEBU: 2.5000 mg | INHALATION_SOLUTION | Freq: Four times a day (QID) | RESPIRATORY_TRACT | 99 refills | Status: DC | PRN

## 2019-04-29 NOTE — Progress Notes (Signed)
Patient [facility]   Established Patient Office Visit  Subjective:  Patient ID: Heidi Tran, female    DOB: Dec 28, 1960  Age: 58 y.o. MRN: [id number]  CC:  Chief Complaint  Patient presents with  . Arm Pain    left arm pain down to elbow, felt pop 7 days ago, tired ice, heat, and pain meds     HPI Heidi Tran is a 58 year female who presents for Follow Up today.   Past Medical History:  Diagnosis Date  . Allergic rhinitis, cause unspecified   . Asthma   . Unspecified essential hypertension    Current Status: Since her last office visit, she has c/o left shoulder pain X 2 weeks. She has been taking Naproxen and Flexeril with minimal relief.  She denies fevers, chills, fatigue, recent infections, weight loss, and night sweats. She has not had any headaches, visual changes, dizziness, and falls. No chest pain, heart palpitations, cough and shortness of breath reported. No reports of GI problems such as nausea, vomiting, diarrhea, and constipation. She has no reports of blood in stools, dysuria and hematuria. No depression and anxiety reported today. She denies suicidal ideations, homicidal ideations, or auditory hallucinations.   Past Surgical History:  Procedure Laterality Date  . TOTAL ABDOMINAL HYSTERECTOMY W/ BILATERAL SALPINGOOPHORECTOMY      Family History  Problem Relation Age of Onset  . Cancer Mother   . Prostate cancer Father   . Heart disease Father   . Diabetes Father     Social History   Socioeconomic History  . Marital status: Single    Spouse name: Not on file  . Number of children: Not on file  . Years of education: Not on file  . Highest education level: Not on file  Occupational History  . Occupation: Network engineer  Social Needs  . Financial resource strain: Not on file  . Food insecurity    Worry: Not on file    Inability: Not on file  . Transportation needs    Medical: Not on file    Non-medical: Not  on file  Tobacco Use  . Smoking status: Never Smoker  . Smokeless tobacco: Never Used  Substance and Sexual Activity  . Alcohol use: Yes    Comment: very rare  . Drug use: Never  . Sexual activity: Not Currently  Lifestyle  . Physical activity    Days per week: Not on file    Minutes per session: Not on file  . Stress: Not on file  Relationships  . Social Herbalist on phone: Not on file    Gets together: Not on file    Attends religious service: Not on file    Active member of club or organization: Not on file    Attends meetings of clubs or organizations: Not on file    Relationship status: Not on file  . Intimate partner violence    Fear of current or ex partner: Not on file    Emotionally abused: Not on file    Physically abused: Not on file    Forced sexual activity: Not on file  Other Topics Concern  . Not on file  Social History Narrative  . Not on file    Outpatient Medications Prior to Visit  Medication Sig Dispense Refill  . fish oil-omega-3 fatty acids 1000 MG capsule Take 1 g by mouth daily.      . hydrocortisone cream 1 % Apply  1 application topically 2 (two) times daily. 30 g 3  . Multiple Vitamin (MULTIVITAMIN) capsule Take 1 capsule by mouth daily. 30 capsule 6  . potassium chloride SA (K-DUR) 20 MEQ tablet Take 1 tablet (20 mEq total) by mouth daily. 30 tablet 3  . triamcinolone cream (KENALOG) 0.1 % Apply 1 application topically 2 (two) times daily. 30 g 3  . albuterol (PROVENTIL) (2.5 MG/3ML) 0.083% nebulizer solution Take 3 mLs (2.5 mg total) by nebulization every 6 (six) hours as needed. 75 mL prn  . COMBIVENT RESPIMAT 20-100 MCG/ACT AERS respimat INHALE 1 PUFF BY MOUTH EVERY 4 HOURS AS NEEDED 36 g PRN  . cyclobenzaprine (FLEXERIL) 10 MG tablet Take 1 tablet (10 mg total) by mouth 3 (three) times daily as needed for muscle spasms. 30 tablet 6  . fluticasone-salmeterol (ADVAIR HFA) 115-21 MCG/ACT inhaler INHALE 2 PUFFS INTO THE LUNGS 2 (TWO)  TIMES DAILY. 12 g 12  . naproxen (NAPROSYN) 500 MG tablet Take 1 tablet (500 mg total) by mouth 2 (two) times daily with a meal. 60 tablet 2  . potassium chloride (KLOR-CON) 10 MEQ tablet TAKE 2 TABLETS BY MOUTH DAILY 60 tablet 0  . triamterene-hydrochlorothiazide (MAXZIDE-25) 37.5-25 MG tablet Take 1 tablet by mouth 2 (two) times daily. 60 tablet 3  . Vitamin D, Ergocalciferol, (DRISDOL) 50000 units CAPS capsule Take 1 capsule (50,000 Units total) by mouth every 7 (seven) days. 30 capsule 0  . famotidine (PEPCID) 20 MG tablet 1 twice daily before meals (Patient not taking: Reported on 04/29/2019) 60 tablet 3  . gemfibrozil (LOPID) 600 MG tablet Take 1 tablet (600 mg total) by mouth 2 (two) times daily before a meal. (Patient not taking: Reported on 04/29/2019) 60 tablet 2   No facility-administered medications prior to visit.     Allergies  Allergen Reactions  . Statins Other (See Comments)    Leg cramps  . Sulfonamide Derivatives     REACTION: rash    ROS Review of Systems  Constitutional: Negative.   HENT: Negative.   Eyes: Negative.   Respiratory: Positive for cough (occasional ) and shortness of breath (occasional ).   Cardiovascular: Negative.   Gastrointestinal: Negative.   Endocrine: Negative.   Genitourinary: Negative.   Musculoskeletal:       Left arm pain  Skin: Negative.   Allergic/Immunologic: Negative.   Neurological: Negative.   Hematological: Negative.   Psychiatric/Behavioral: Negative.       Objective:    Physical Exam  Constitutional: She is oriented to person, place, and time. She appears well-developed and well-nourished.  HENT:  Head: Normocephalic and atraumatic.  Eyes: Conjunctivae are normal.  Neck: Normal range of motion. Neck supple.  Cardiovascular: Normal rate, regular rhythm, normal heart sounds and intact distal pulses.  Pulmonary/Chest: Effort normal and breath sounds normal.  Abdominal: Soft. Bowel sounds are normal.  Musculoskeletal:  Normal range of motion.  Neurological: She is alert and oriented to person, place, and time. She has normal reflexes.  Skin: Skin is warm and dry.  Psychiatric: She has a normal mood and affect. Her behavior is normal. Judgment and thought content normal.  Nursing note and vitals reviewed.   BP 134/77   Pulse 99   Temp 98.1 F (36.7 C) (Oral)   Ht  (1.575 m)   Wt 170 lb (77.1 kg)   SpO2 97%   BMI 31.09 kg/m  Wt Readings from Last 3 Encounters:  04/29/19 170 lb (77.1 kg)  01/31/19 161 lb 6.4  oz (73.2 kg)  08/14/18 159 lb (72.1 kg)     Health Maintenance Due  Topic Date Due  . Hepatitis C Screening  11/28/1960  . HIV Screening  11/25/1973  . TETANUS/TDAP  11/25/1977  . PAP SMEAR-Modifier  11/26/1979  . COLONOSCOPY  11/25/2008  . MAMMOGRAM  12/01/2017    There are no preventive care reminders to display for this patient.  Lab Results  Component Value Date   TSH 1.480 01/31/2019   Lab Results  Component Value Date   WBC 5.1 01/31/2019   HGB 11.7 01/31/2019   HCT 35.9 01/31/2019   MCV 79 01/31/2019   PLT 316 01/31/2019   Lab Results  Component Value Date   NA 138 09/30/2017   K 2.8 (L) 09/30/2017   CO2 24 09/30/2017   GLUCOSE 115 (H) 09/30/2017   BUN 16 09/30/2017   CREATININE 0.92 09/30/2017   BILITOT 0.8 09/30/2017   ALKPHOS 92 09/30/2017   AST 25 09/30/2017   ALT 19 09/30/2017   PROT 7.4 09/30/2017   ALBUMIN 4.4 09/30/2017   CALCIUM 10.1 09/30/2017   ANIONGAP 13 09/30/2017   GFR 105.95 12/30/2010   Lab Results  Component Value Date   CHOL 289 (H) 01/31/2019   Lab Results  Component Value Date   HDL 52 01/31/2019   Lab Results  Component Value Date   LDLCALC 219 (H) 01/31/2019   Lab Results  Component Value Date   TRIG 103 01/31/2019   Lab Results  Component Value Date   CHOLHDL 5.6 (H) 01/31/2019   Lab Results  Component Value Date   HGBA1C 5.8 (A) 02/18/2018      Assessment & Plan:   1. Essential hypertension The  current medical regimen is effective; blood pressure is stable at 134/77 today; continue present plan and medications as prescribed. She will continue to take medications as prescribed, to decrease high sodium intake, excessive alcohol intake, increase potassium intake, smoking cessation, and increase physical activity of at least 30 minutes of cardio activity daily. She will continue to follow Heart Healthy or DASH diet. - Urinalysis Dipstick - triamterene-hydrochlorothiazide (MAXZIDE-25) 37.5-25 MG tablet; Take 1 tablet by mouth 2 (two) times daily.  Dispense: 60 tablet; Refill: 3  2. Shoulder strain, left, initial encounter She received Ketorolac injection in office today. We will initiate Prednisone today.  - ketorolac (TORADOL) 30 MG/ML injection 30 mg - predniSONE (DELTASONE) 10 MG tablet; Day #1: Take 6 tablets by mouth Day #2: Take 5 tablets by mouth Day #3: Take 4 tablets by mouth Day #4: Take 3 tablets by mouth Day #5: Take 2 tablets by mouth Day #6: Take 1 tablet, then complete.  Dispense: 21 tablet; Refill: 0 - ketorolac (TORADOL) injection 60 mg  3. Pain - cyclobenzaprine (FLEXERIL) 10 MG tablet; Take 1 tablet (10 mg total) by mouth 3 (three) times daily as needed for muscle spasms.  Dispense: 30 tablet; Refill: 6 - naproxen (NAPROSYN) 500 MG tablet; Take 1 tablet (500 mg total) by mouth 2 (two) times daily with a meal.  Dispense: 60 tablet; Refill: 2 - ketorolac (TORADOL) injection 60 mg  4. Low back pain with left-sided sciatica, unspecified back pain laterality, unspecified chronicity - cyclobenzaprine (FLEXERIL) 10 MG tablet; Take 1 tablet (10 mg total) by mouth 3 (three) times daily as needed for muscle spasms.  Dispense: 30 tablet; Refill: 6 - naproxen (NAPROSYN) 500 MG tablet; Take 1 tablet (500 mg total) by mouth 2 (two) times daily with a meal.  Dispense: 60 tablet; Refill: 2  5. Right arm pain - naproxen (NAPROSYN) 500 MG tablet; Take 1 tablet (500 mg total) by mouth 2  (two) times daily with a meal.  Dispense: 60 tablet; Refill: 2  6. Moderate asthma without complication, unspecified whether persistent Stable today. No signs or symptoms of respiratory distress noted or reported.  - COMBIVENT RESPIMAT 20-100 MCG/ACT AERS respimat; INHALE 1 PUFF BY MOUTH EVERY 4 HOURS AS NEEDED  Dispense: 36 g; Refill: PRN - albuterol (PROVENTIL) (2.5 MG/3ML) 0.083% nebulizer solution; Take 3 mLs (2.5 mg total) by nebulization every 6 (six) hours as needed.  Dispense: 75 mL; Refill: prn - fluticasone-salmeterol (ADVAIR HFA) 115-21 MCG/ACT inhaler; INHALE 2 PUFFS INTO THE LUNGS 2 (TWO) TIMES DAILY.  Dispense: 12 g; Refill: 12  7. Vitamin D deficiency - Vitamin D, Ergocalciferol, (DRISDOL) 1.25 MG (50000 UT) CAPS capsule; Take 1 capsule (50,000 Units total) by mouth every 7 (seven) days.  Dispense: 30 capsule; Refill: 0  8. Shortness of breath Stable today.   9. Follow up She will follow up in 6 months.   Meds ordered this encounter  Medications  . ketorolac (TORADOL) 30 MG/ML injection 30 mg  . predniSONE (DELTASONE) 10 MG tablet    Sig: Day #1: Take 6 tablets by mouth Day #2: Take 5 tablets by mouth Day #3: Take 4 tablets by mouth Day #4: Take 3 tablets by mouth Day #5: Take 2 tablets by mouth Day #6: Take 1 tablet, then complete.    Dispense:  21 tablet    Refill:  0  . COMBIVENT RESPIMAT 20-100 MCG/ACT AERS respimat    Sig: INHALE 1 PUFF BY MOUTH EVERY 4 HOURS AS NEEDED    Dispense:  36 g    Refill:  PRN  . albuterol (PROVENTIL) (2.5 MG/3ML) 0.083% nebulizer solution    Sig: Take 3 mLs (2.5 mg total) by nebulization every 6 (six) hours as needed.    Dispense:  75 mL    Refill:  prn  . fluticasone-salmeterol (ADVAIR HFA) 115-21 MCG/ACT inhaler    Sig: INHALE 2 PUFFS INTO THE LUNGS 2 (TWO) TIMES DAILY.    Dispense:  12 g    Refill:  12  . cyclobenzaprine (FLEXERIL) 10 MG tablet    Sig: Take 1 tablet (10 mg total) by mouth 3 (three) times daily as needed for  muscle spasms.    Dispense:  30 tablet    Refill:  6  . naproxen (NAPROSYN) 500 MG tablet    Sig: Take 1 tablet (500 mg total) by mouth 2 (two) times daily with a meal.    Dispense:  60 tablet    Refill:  2  . Vitamin D, Ergocalciferol, (DRISDOL) 1.25 MG (50000 UT) CAPS capsule    Sig: Take 1 capsule (50,000 Units total) by mouth every 7 (seven) days.    Dispense:  30 capsule    Refill:  0  . triamterene-hydrochlorothiazide (MAXZIDE-25) 37.5-25 MG tablet    Sig: Take 1 tablet by mouth 2 (two) times daily.    Dispense:  60 tablet    Refill:  3  . ketorolac (TORADOL) injection 60 mg    Orders Placed This Encounter  Procedures  . Urinalysis Dipstick    Referral Orders  No referral(s) requested today    Raliegh Ip,  MSN, FNP-BC [facility] [address] [place], [place] [address] [phone] fax    Problem List  Items Addressed This Visit      Respiratory   Moderate asthma without complication   Relevant Medications   predniSONE (DELTASONE) 10 MG tablet   COMBIVENT RESPIMAT 20-100 MCG/ACT AERS respimat   albuterol (PROVENTIL) (2.5 MG/3ML) 0.083% nebulizer solution   fluticasone-salmeterol (ADVAIR HFA) 115-21 MCG/ACT inhaler     Nervous and Auditory   Low back pain with left-sided sciatica   Relevant Medications   ketorolac (TORADOL) 30 MG/ML injection 30 mg   predniSONE (DELTASONE) 10 MG tablet   cyclobenzaprine (FLEXERIL) 10 MG tablet   naproxen (NAPROSYN) 500 MG tablet     Other   Shortness of breath    Other Visit Diagnoses    Essential hypertension    -  Primary   Relevant Medications   triamterene-hydrochlorothiazide (MAXZIDE-25) 37.5-25 MG tablet   Other Relevant Orders   Urinalysis Dipstick (Completed)   Shoulder strain, left, initial encounter       Relevant Medications   ketorolac (TORADOL) 30 MG/ML injection 30 mg   predniSONE (DELTASONE) 10 MG tablet    ketorolac (TORADOL) injection 60 mg (Completed)   Pain       Relevant Medications   cyclobenzaprine (FLEXERIL) 10 MG tablet   naproxen (NAPROSYN) 500 MG tablet   ketorolac (TORADOL) injection 60 mg (Completed)   Right arm pain       Relevant Medications   naproxen (NAPROSYN) 500 MG tablet   Vitamin D deficiency       Relevant Medications   Vitamin D, Ergocalciferol, (DRISDOL) 1.25 MG (50000 UT) CAPS capsule   Follow up          Meds ordered this encounter  Medications  . ketorolac (TORADOL) 30 MG/ML injection 30 mg  . predniSONE (DELTASONE) 10 MG tablet    Sig: Day #1: Take 6 tablets by mouth Day #2: Take 5 tablets by mouth Day #3: Take 4 tablets by mouth Day #4: Take 3 tablets by mouth Day #5: Take 2 tablets by mouth Day #6: Take 1 tablet, then complete.    Dispense:  21 tablet    Refill:  0  . COMBIVENT RESPIMAT 20-100 MCG/ACT AERS respimat    Sig: INHALE 1 PUFF BY MOUTH EVERY 4 HOURS AS NEEDED    Dispense:  36 g    Refill:  PRN  . albuterol (PROVENTIL) (2.5 MG/3ML) 0.083% nebulizer solution    Sig: Take 3 mLs (2.5 mg total) by nebulization every 6 (six) hours as needed.    Dispense:  75 mL    Refill:  prn  . fluticasone-salmeterol (ADVAIR HFA) 115-21 MCG/ACT inhaler    Sig: INHALE 2 PUFFS INTO THE LUNGS 2 (TWO) TIMES DAILY.    Dispense:  12 g    Refill:  12  . cyclobenzaprine (FLEXERIL) 10 MG tablet    Sig: Take 1 tablet (10 mg total) by mouth 3 (three) times daily as needed for muscle spasms.    Dispense:  30 tablet    Refill:  6  . naproxen (NAPROSYN) 500 MG tablet    Sig: Take 1 tablet (500 mg total) by mouth 2 (two) times daily with a meal.    Dispense:  60 tablet    Refill:  2  . Vitamin D, Ergocalciferol, (DRISDOL) 1.25 MG (50000 UT) CAPS capsule    Sig: Take 1 capsule (50,000 Units total) by mouth every 7 (seven) days.    Dispense:  30 capsule    Refill:  0  . triamterene-hydrochlorothiazide (MAXZIDE-25) 37.5-25 MG tablet  Sig: Take 1 tablet by mouth 2  (two) times daily.    Dispense:  60 tablet    Refill:  3  . ketorolac (TORADOL) injection 60 mg    Follow-up: Return in about 6 months (around 10/28/2019).    Kallie LocksNatalie M Magdaleno Lortie, FNP

## 2019-04-29 NOTE — Patient Instructions (Signed)
Muscle Strain A muscle strain is an injury that happens when a muscle is stretched longer than normal. This can happen during a fall, sports, or lifting. This can tear some muscle fibers. Usually, recovery from muscle strain takes 1-2 weeks. Complete healing normally takes 5-6 weeks. This condition is first treated with PRICE therapy. This involves:  Protecting your muscle from being injured again.  Resting your injured muscle.  Icing your injured muscle.  Applying pressure (compression) to your injured muscle. This may be done with a splint or elastic bandage.  Raising (elevating) your injured muscle. Your doctor may also recommend medicine for pain. Follow these instructions at home: If you have a splint:  Wear the splint as told by your doctor. Take it off only as told by your doctor.  Loosen the splint if your fingers or toes tingle, get numb, or turn cold and blue.  Keep the splint clean.  If the splint is not waterproof: ? Do not let it get wet. ? Cover it with a watertight covering when you take a bath or a shower. Managing pain, stiffness, and swelling   If directed, put ice on your injured area. ? If you have a removable splint, take it off as told by your doctor. ? Put ice in a plastic bag. ? Place a towel between your skin and the bag. ? Leave the ice on for 20 minutes, 2-3 times a day.  Move your fingers or toes often. This helps to avoid stiffness and lessen swelling.  Raise your injured area above the level of your heart while you are sitting or lying down.  Wear an elastic bandage as told by your doctor. Make sure it is not too tight. General instructions  Take over-the-counter and prescription medicines only as told by your doctor.  Limit your activity. Rest your injured muscle as told by your doctor. Your doctor may say that gentle movements are okay.  If physical therapy was prescribed, do exercises as told by your doctor.  Do not put pressure on any  part of the splint until it is fully hardened. This may take many hours.  Do not use any products that contain nicotine or tobacco, such as cigarettes and e-cigarettes. These can delay bone healing. If you need help quitting, ask your doctor.  Warm up before you exercise. This helps to prevent more muscle strains.  Ask your doctor when it is safe to drive if you have a splint.  Keep all follow-up visits as told by your doctor. This is important. Contact a doctor if:  You have more pain or swelling in your injured area. Get help right away if:  You have any of these problems in your injured area: ? You have numbness. ? You have tingling. ? You lose a lot of strength. Summary  A muscle strain is an injury that happens when a muscle is stretched longer than normal.  This condition is first treated with PRICE therapy. This includes protecting, resting, icing, adding pressure, and raising your injury.  Limit your activity. Rest your injured muscle as told by your doctor. Your doctor may say that gentle movements are okay.  Warm up before you exercise. This helps to prevent more muscle strains. This information is not intended to replace advice given to you by your health care provider. Make sure you discuss any questions you have with your health care provider. Document Released: 02/22/2008 Document Revised: 07/11/2018 Document Reviewed: 06/21/2016 Elsevier Patient Education  2020 [employer].  Prednisone tablets What is this medicine? PREDNISONE (PRED ni sone) is a corticosteroid. It is commonly used to treat inflammation of the skin, joints, lungs, and other organs. Common conditions treated include asthma, allergies, and arthritis. It is also used for other conditions, such as blood disorders and diseases of the adrenal glands. This medicine may be used for other purposes; ask your health care provider or pharmacist if you have questions. COMMON BRAND NAME(S): Deltasone, Predone,  Sterapred, Sterapred DS What should I tell my health care provider before I take this medicine? They need to know if you have any of these conditions:  Cushing's syndrome  diabetes  glaucoma  heart disease  high blood pressure  infection (especially a virus infection such as chickenpox, cold sores, or herpes)  kidney disease  liver disease  mental illness  myasthenia gravis  osteoporosis  seizures  stomach or intestine problems  thyroid disease  an unusual or allergic reaction to lactose, prednisone, other medicines, foods, dyes, or preservatives  pregnant or trying to get pregnant  breast-feeding How should I use this medicine? Take this medicine by mouth with a glass of water. Follow the directions on the prescription label. Take this medicine with food. If you are taking this medicine once a day, take it in the morning. Do not take more medicine than you are told to take. Do not suddenly stop taking your medicine because you may develop a severe reaction. Your doctor will tell you how much medicine to take. If your doctor wants you to stop the medicine, the dose may be slowly lowered over time to avoid any side effects. Talk to your pediatrician regarding the use of this medicine in children. Special care may be needed. Overdosage: If you think you have taken too much of this medicine contact a poison control center or emergency room at once. NOTE: This medicine is only for you. Do not share this medicine with others. What if I miss a dose? If you miss a dose, take it as soon as you can. If it is almost time for your next dose, talk to your doctor or health care professional. You may need to miss a dose or take an extra dose. Do not take double or extra doses without advice. What may interact with this medicine? Do not take this medicine with any of the following medications:  metyrapone  mifepristone This medicine may also interact with the following medications:   aminoglutethimide  amphotericin B  aspirin and aspirin-like medicines  barbiturates  certain medicines for diabetes, like glipizide or glyburide  cholestyramine  cholinesterase inhibitors  cyclosporine  digoxin  diuretics  ephedrine  female hormones, like estrogens and birth control pills  isoniazid  ketoconazole  NSAIDS, medicines for pain and inflammation, like ibuprofen or naproxen  phenytoin  rifampin  toxoids  vaccines  warfarin This list may not describe all possible interactions. Give your health care provider a list of all the medicines, herbs, non-prescription drugs, or dietary supplements you use. Also tell them if you smoke, drink alcohol, or use illegal drugs. Some items may interact with your medicine. What should I watch for while using this medicine? Visit your doctor or health care professional for regular checks on your progress. If you are taking this medicine over a prolonged period, carry an identification card with your name and address, the type and dose of your medicine, and your doctor's name and address. This medicine may increase your risk of getting an infection. Tell your doctor  or health care professional if you are around anyone with measles or chickenpox, or if you develop sores or blisters that do not heal properly. If you are going to have surgery, tell your doctor or health care professional that you have taken this medicine within the last twelve months. Ask your doctor or health care professional about your diet. You may need to lower the amount of salt you eat. This medicine may increase blood sugar. Ask your healthcare provider if changes in diet or medicines are needed if you have diabetes. What side effects may I notice from receiving this medicine? Side effects that you should report to your doctor or health care professional as soon as possible:  allergic reactions like skin rash, itching or hives, swelling of the face,  lips, or tongue  changes in emotions or moods  changes in vision  depressed mood  eye pain  fever or chills, cough, sore throat, pain or difficulty passing urine  signs and symptoms of high blood sugar such as being more thirsty or hungry or having to urinate more than normal. You may also feel very tired or have blurry vision.  swelling of ankles, feet Side effects that usually do not require medical attention (report to your doctor or health care professional if they continue or are bothersome):  confusion, excitement, restlessness  headache  nausea, vomiting  skin problems, acne, thin and shiny skin  trouble sleeping  weight gain This list may not describe all possible side effects. Call your doctor for medical advice about side effects. You may report side effects to FDA at 1-800-FDA-1088. Where should I keep my medicine? Keep out of the reach of children. Store at room temperature between 15 and 30 degrees C (59 and 86 degrees F). Protect from light. Keep container tightly closed. Throw away any unused medicine after the expiration date. NOTE: This sheet is a summary. It may not cover all possible information. If you have questions about this medicine, talk to your doctor, pharmacist, or health care provider.  2020 Elsevier/Gold Standard (2018-02-12 10:54:22) Ketorolac injection What is this medicine? KETOROLAC (kee toe ROLE ak) is a non-steroidal anti-inflammatory drug (NSAID). It is used to treat moderate to severe pain for up to 5 days. It is commonly used after surgery. This medicine should not be used for more than 5 days. This medicine may be used for other purposes; ask your health care provider or pharmacist if you have questions. COMMON BRAND NAME(S): Toradol What should I tell my health care provider before I take this medicine? They need to know if you have any of these conditions:  asthma, especially aspirin-sensitive asthma  bleeding problems  coronary  artery bypass graft (CABG) surgery within the past 2 weeks  kidney disease  stomach bleed, ulcer, or other problem  taking aspirin, other NSAID, or probenecid  an unusual or allergic reaction to ketorolac, tromethamine, aspirin, other NSAIDs, other medicines, foods, dyes or preservatives  pregnant or trying to get pregnant  breast-feeding How should I use this medicine? This medicine is for injection into a muscle or into a vein. It is given by a health care professional in a hospital or clinic setting. Talk to your pediatrician regarding the use of this medicine in children. Special care may be needed. Patients over 60 years old may have a stronger reaction and need a smaller dose. Overdosage: If you think you have taken too much of this medicine contact a poison control center or emergency room at once.  NOTE: This medicine is only for you. Do not share this medicine with others. What if I miss a dose? This does not apply. What may interact with this medicine? Do not take this medicine with any of the following medications:  aspirin and aspirin-like medicines  cidofovir  methotrexate  NSAIDs, medicines for pain and inflammation, like ibuprofen or naproxen  pentoxifylline  probenecid This medicine may also interact with the following medications:  alcohol  alendronate  alprazolam  carbamazepine  diuretics  flavocoxid  fluoxetine  ginkgo  lithium  medicines for blood pressure like enalapril  medicines that affect platelets like pentoxifylline  medicines that treat or prevent blood clots like heparin, warfarin  muscle relaxants  pemetrexed  phenytoin  thiothixene This list may not describe all possible interactions. Give your health care provider a list of all the medicines, herbs, non-prescription drugs, or dietary supplements you use. Also tell them if you smoke, drink alcohol, or use illegal drugs. Some items may interact with your medicine. What  should I watch for while using this medicine? Tell your doctor or healthcare provider if your symptoms do not start to get better or if they get worse. This medicine may cause serious skin reactions. They can happen weeks to months after starting the medicine. Contact your healthcare provider right away if you notice fevers or flu-like symptoms with a rash. The rash may be red or purple and then turn into blisters or peeling of the skin. Or, you might notice a red rash with swelling of the face, lips or lymph nodes in your neck or under your arms. This medicine does not prevent heart attack or stroke. In fact, this medicine may increase the chance of a heart attack or stroke. The chance may increase with longer use of this medicine and in people who have heart disease. If you take aspirin to prevent heart attack or stroke, talk with your doctor or healthcare provider. Do not take medicines such as ibuprofen and naproxen with this medicine. Side effects such as stomach upset, nausea, or ulcers may be more likely to occur. Many medicines available without a prescription should not be taken with this medicine. This medicine can cause ulcers and bleeding in the stomach and intestines at any time during treatment. Do not smoke cigarettes or drink alcohol. These increase irritation to your stomach and can make it more susceptible to damage from this medicine. Ulcers and bleeding can happen without warning symptoms and can cause death. This medicine can cause you to bleed more easily. Try to avoid damage to your teeth and gums when you brush or floss your teeth. What side effects may I notice from receiving this medicine? Side effects that you should report to your doctor or health care professional as soon as possible:  allergic reactions like skin rash, itching or hives, swelling of the face, lips, or tongue  breathing problems  high blood pressure  nausea, vomiting  redness, blistering, peeling or  loosening of the skin, including inside the mouth  severe stomach pain  signs and symptoms of bleeding such as bloody or black, tarry stools; red or dark-brown urine; spitting up blood or brown material that looks like coffee grounds; red spots on the skin; unusual bruising or bleeding from the eye, gums, or nose  signs and symptoms of a blood clot changes in vision; chest pain; severe, sudden headache; trouble speaking; sudden numbness or weakness of the face, arm, or leg  trouble passing urine or change in the  amount of urine  unexplained weight gain or swelling  unusually weak or tired  yellowing of eyes or skin Side effects that usually do not require medical attention (report to your doctor or health care professional if they continue or are bothersome):  diarrhea  dizziness  headache  heartburn This list may not describe all possible side effects. Call your doctor for medical advice about side effects. You may report side effects to FDA at 1-800-FDA-1088. Where should I keep my medicine? This drug is given in a hospital or clinic and will not be stored at home. NOTE: This sheet is a summary. It may not cover all possible information. If you have questions about this medicine, talk to your doctor, pharmacist, or health care provider.  2020 Elsevier/Gold Standard (2018-07-31 15:10:53)

## 2019-07-15 ENCOUNTER — Telehealth: Payer: Self-pay | Admitting: Family Medicine

## 2019-07-15 NOTE — Telephone Encounter (Signed)
Pt having shoulder pain. Wanted to know if she should make an appointment to get another injection or if the doctor could increase 750mg  aleve.

## 2019-07-15 NOTE — Telephone Encounter (Signed)
Patient stated: She has some shortness of breath, nausea with pain in the neck & shoulder. She also has no appetite.   Patient advised to go get tested for COVID. If  problem continues and negative for COVID,  call back to move her appointment up.

## 2019-08-01 ENCOUNTER — Ambulatory Visit: Payer: BC Managed Care – PPO | Admitting: Family Medicine

## 2019-08-07 ENCOUNTER — Telehealth: Payer: Self-pay | Admitting: Family Medicine

## 2019-08-07 NOTE — Telephone Encounter (Signed)
Pt wants advice on getting covid shot considering her allergies.

## 2019-08-14 NOTE — Telephone Encounter (Signed)
Pt called again regarding getting covid shot due to allergies. Please call pt back.

## 2019-09-12 ENCOUNTER — Other Ambulatory Visit: Payer: Self-pay | Admitting: Family Medicine

## 2019-09-12 DIAGNOSIS — M79601 Pain in right arm: Secondary | ICD-10-CM

## 2019-09-12 DIAGNOSIS — M5442 Lumbago with sciatica, left side: Secondary | ICD-10-CM

## 2019-09-12 DIAGNOSIS — R52 Pain, unspecified: Secondary | ICD-10-CM

## 2019-10-22 ENCOUNTER — Other Ambulatory Visit: Payer: Self-pay | Admitting: Family Medicine

## 2019-10-22 DIAGNOSIS — I10 Essential (primary) hypertension: Secondary | ICD-10-CM

## 2019-10-22 NOTE — Telephone Encounter (Signed)
Refill request for Triamterene-hctz

## 2019-10-28 ENCOUNTER — Ambulatory Visit: Payer: BC Managed Care – PPO | Admitting: Family Medicine

## 2019-11-05 ENCOUNTER — Ambulatory Visit (HOSPITAL_COMMUNITY)
Admission: EM | Admit: 2019-11-05 | Discharge: 2019-11-05 | Disposition: A | Discharge status: Home / Self Care | Payer: BC Managed Care – PPO | Attending: Urgent Care | Admitting: Urgent Care

## 2019-11-05 ENCOUNTER — Other Ambulatory Visit: Payer: Self-pay

## 2019-11-05 ENCOUNTER — Ambulatory Visit (INDEPENDENT_AMBULATORY_CARE_PROVIDER_SITE_OTHER): Payer: BC Managed Care – PPO

## 2019-11-05 ENCOUNTER — Encounter (HOSPITAL_COMMUNITY): Payer: Self-pay

## 2019-11-05 DIAGNOSIS — S62357A Nondisplaced fracture of shaft of fifth metacarpal bone, left hand, initial encounter for closed fracture: Secondary | ICD-10-CM | POA: Diagnosis not present

## 2019-11-05 DIAGNOSIS — S40012A Contusion of left shoulder, initial encounter: Secondary | ICD-10-CM

## 2019-11-05 DIAGNOSIS — S80212A Abrasion, left knee, initial encounter: Secondary | ICD-10-CM

## 2019-11-05 DIAGNOSIS — M79642 Pain in left hand: Secondary | ICD-10-CM

## 2019-11-05 DIAGNOSIS — M25512 Pain in left shoulder: Secondary | ICD-10-CM | POA: Diagnosis not present

## 2019-11-05 DIAGNOSIS — M25562 Pain in left knee: Secondary | ICD-10-CM

## 2019-11-05 DIAGNOSIS — M79602 Pain in left arm: Secondary | ICD-10-CM

## 2019-11-05 DIAGNOSIS — S6992XA Unspecified injury of left wrist, hand and finger(s), initial encounter: Secondary | ICD-10-CM

## 2019-11-05 MED ORDER — TRAMADOL-ACETAMINOPHEN 37.5-325 MG PO TABS: 1.0000 | ORAL_TABLET | Freq: Three times a day (TID) | ORAL | 0 refills | Status: DC | PRN

## 2019-11-05 MED ORDER — ACETAMINOPHEN 325 MG PO TABS
ORAL_TABLET | ORAL | Status: AC
  Filled 2019-11-05: qty 2

## 2019-11-05 MED ORDER — ACETAMINOPHEN 325 MG PO TABS
650.0000 mg | ORAL_TABLET | Freq: Once | ORAL | Status: AC
  Administered 2019-11-05: 650 mg via ORAL

## 2019-11-05 MED ORDER — BACITRACIN ZINC 500 UNIT/GM EX OINT
TOPICAL_OINTMENT | CUTANEOUS | Status: AC
  Filled 2019-11-05: qty 1.8
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
## 2019-11-05 NOTE — Progress Notes (Signed)
Orthopedic Tech Progress Note Patient Details:  Heidi Tran [birth year] [id number]  Ortho Devices Type of Ortho Device: Ulna gutter splint, Arm sling Ortho Device/Splint Location: LUE Ortho Device/Splint Interventions: Application   Post Interventions Patient Tolerated: Well Instructions Provided: Adjustment of device, Care of device   [name] E [name] 11/05/2019, 8:54 PM

## 2019-11-05 NOTE — Discharge Instructions (Addendum)
Please schedule Tylenol at 500 mg - 650 mg once every 6 hours as needed for aches and pains.  If you still have pain despite taking Tylenol regularly, this is breakthrough pain.  You can use tramadol-Tylenol once every 6 hours for this.  Once your pain is better controlled, switch back to just Tylenol.  Make sure you wear the hand splint and contact the hand specialist for consult.  Use the shoulder sling to help with your shoulder pain and contusion but keep it moving as well within the tolerance of your pain.

## 2019-11-17 DIAGNOSIS — M25512 Pain in left shoulder: Secondary | ICD-10-CM | POA: Insufficient documentation

## 2019-12-05 DIAGNOSIS — S62318A Displaced fracture of base of other metacarpal bone, initial encounter for closed fracture: Secondary | ICD-10-CM | POA: Insufficient documentation

## 2019-12-28 DIAGNOSIS — E7800 Pure hypercholesterolemia, unspecified: Secondary | ICD-10-CM

## 2019-12-28 HISTORY — DX: Pure hypercholesterolemia, unspecified: E78.00

## 2020-01-21 ENCOUNTER — Encounter: Payer: Self-pay | Admitting: Family Medicine

## 2020-01-21 ENCOUNTER — Ambulatory Visit (INDEPENDENT_AMBULATORY_CARE_PROVIDER_SITE_OTHER): Payer: BC Managed Care – PPO | Admitting: Family Medicine

## 2020-01-21 ENCOUNTER — Other Ambulatory Visit: Payer: Self-pay

## 2020-01-21 VITALS — BP 155/69 | HR 84 | Temp 97.9°F | Ht 62.0 in | Wt 160.0 lb

## 2020-01-21 DIAGNOSIS — S46912A Strain of unspecified muscle, fascia and tendon at shoulder and upper arm level, left arm, initial encounter: Secondary | ICD-10-CM

## 2020-01-21 DIAGNOSIS — M25512 Pain in left shoulder: Secondary | ICD-10-CM

## 2020-01-21 DIAGNOSIS — I10 Essential (primary) hypertension: Secondary | ICD-10-CM | POA: Diagnosis not present

## 2020-01-21 DIAGNOSIS — J45909 Unspecified asthma, uncomplicated: Secondary | ICD-10-CM | POA: Diagnosis not present

## 2020-01-21 DIAGNOSIS — Z09 Encounter for follow-up examination after completed treatment for conditions other than malignant neoplasm: Secondary | ICD-10-CM

## 2020-01-21 DIAGNOSIS — R0602 Shortness of breath: Secondary | ICD-10-CM

## 2020-01-21 LAB — POCT URINALYSIS DIPSTICK
Bilirubin, UA: NEGATIVE
Blood, UA: NEGATIVE
Glucose, UA: NEGATIVE
Ketones, UA: NEGATIVE
Leukocytes, UA: NEGATIVE
Nitrite, UA: NEGATIVE
Protein, UA: NEGATIVE
Spec Grav, UA: 1.015 (ref 1.010–1.025)
Urobilinogen, UA: 0.2 E.U./dL
pH, UA: 7 (ref 5.0–8.0)

## 2020-01-21 LAB — POCT GLYCOSYLATED HEMOGLOBIN (HGB A1C)
HbA1c POC (<> result, manual entry): 5.8 % (ref 4.0–5.6)
HbA1c, POC (controlled diabetic range): 5.8 % (ref 0.0–7.0)
HbA1c, POC (prediabetic range): 5.8 % (ref 5.7–6.4)
Hemoglobin A1C: 5.8 % — AB (ref 4.0–5.6)

## 2020-01-21 LAB — GLUCOSE, POCT (MANUAL RESULT ENTRY): POC Glucose: 89 mg/dl (ref 70–99)

## 2020-01-21 NOTE — Progress Notes (Signed)
[facility]    Established Patient Office Visit  Subjective:  Patient ID: Heidi Tran Urbach, female    DOB: 14-Jul-1960  Age: 59 y.o. MRN: [id number]  CC:  Chief Complaint  Patient presents with  . Follow-up    Pt states she is here for refill on medication and a different inhaler.     HPI Heidi Tran Cheema is a 59 year old female who presents for Follow Up today.    Patient Active Problem List   Diagnosis Date Noted  . Shoulder strain, left, initial encounter 04/29/2019  . Pain 04/29/2019  . Right arm pain 04/29/2019  . Vitamin D deficiency 04/29/2019  . Low back pain with left-sided sciatica 01/31/2019  . Eczema 01/31/2019  . Moderate asthma without complication 08/14/2018  . Shortness of breath 07/30/2018  . Rash 07/30/2018  . GERD 02/11/2010  . HYPERTENSION 07/26/2008  . Seasonal and perennial allergic rhinitis 10/11/2007  . 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 ECZEMA 10/11/2007    Past Medical History:  Diagnosis Date  . Allergic rhinitis, cause unspecified   . Asthma   . Hypertension   . Shoulder strain, left, initial encounter 04/2019  . Unspecified essential hypertension   . Vitamin D deficiency     Past Surgical History:  Procedure Laterality Date  . TOTAL ABDOMINAL HYSTERECTOMY W/ BILATERAL SALPINGOOPHORECTOMY     Current Status: Since her last office visit, she continues to have left arm, r/t trauma from a fall. She is taking OTC pain medication for aid in pain. She denies fevers, chills, fatigue, recent infections, weight loss, and night sweats. She has not had any headaches, visual changes, dizziness, and falls. No chest pain, heart palpitations, cough and shortness of breath reported. Denies GI problems such as nausea, vomiting, diarrhea, and constipation. She has no reports of blood in stools, dysuria and hematuria. Her anxiety is moderate today r/t recent left arm trauma.  She is taking all medications as  prescribed.   Family History  Problem Relation Age of Onset  . Cancer Mother   . Prostate cancer Father   . Heart disease Father   . Diabetes Father     Social History   Socioeconomic History  . Marital status: Single    Spouse name: Not on file  . Number of children: Not on file  . Years of education: Not on file  . Highest education level: Not on file  Occupational History  . 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 Smoking status: Never Smoker  . Smokeless tobacco: Never Used  Vaping Use  . Vaping Use: Never used  Substance and Sexual Activity  . Alcohol use: Yes    Comment: very rare  . Drug use: Never  . Sexual activity: Not Currently  Other Topics Concern  . Not on file  Social History Narrative  . Not on file   Social Determinants of Health   Financial Resource Strain:   . Difficulty of Paying Living Expenses: Not on file  Food Insecurity:   . Worried About Programme researcher, broadcasting/film/videounning Out of Food in the Last Year: Not on file  . Ran Out of Food in the Last Year: Not on file  Transportation Needs:   . Lack of Transportation (Medical): Not on file  . Lack of Transportation (Non-Medical): Not on file  Physical Activity:   . Days of Exercise per Week: Not on file  . Minutes of Exercise per Session: Not on file  Stress:   .  Feeling of Stress : Not on file  Social Connections:   . Frequency of Communication with Friends and Family: Not on file  . Frequency of Social Gatherings with Friends and Family: Not on file  . Attends Religious Services: Not on file  . Active Member of Clubs or Organizations: Not on file  . Attends Banker Meetings: Not on file  . Marital Status: Not on file  Intimate Partner Violence:   . Fear of Current or Ex-Partner: Not on file  . Emotionally Abused: Not on file  . Physically Abused: Not on file  . Sexually Abused: Not on file    Outpatient Medications Prior to Visit  Medication Sig Dispense Refill  . fish oil-omega-3 fatty acids  1000 MG capsule Take 1 g by mouth daily.      . hydrocortisone cream 1 % Apply 1 application topically 2 (two) times daily. 30 g 3  . Multiple Vitamin (MULTIVITAMIN) capsule Take 1 capsule by mouth daily. 30 capsule 6  . potassium chloride SA (K-DUR) 20 MEQ tablet Take 1 tablet (20 mEq total) by mouth daily. 30 tablet 3  . triamcinolone cream (KENALOG) 0.1 % Apply 1 application topically 2 (two) times daily. 30 g 3  . triamterene-hydrochlorothiazide (MAXZIDE-25) 37.5-25 MG tablet TAKE 1 TABLET BY MOUTH TWICE A DAY 60 tablet 11  . albuterol (PROVENTIL) (2.5 MG/3ML) 0.083% nebulizer solution Take 3 mLs (2.5 mg total) by nebulization every 6 (six) hours as needed. 75 mL prn  . fluticasone-salmeterol (ADVAIR HFA) 115-21 MCG/ACT inhaler INHALE 2 PUFFS INTO THE LUNGS 2 (TWO) TIMES DAILY. 12 g 12  . cyclobenzaprine (FLEXERIL) 10 MG tablet Take 1 tablet (10 mg total) by mouth 3 (three) times daily as needed for muscle spasms. (Patient not taking: Reported on 01/21/2020) 30 tablet 6  . naproxen (NAPROSYN) 500 MG tablet TAKE 1 TABLET (500 MG TOTAL) BY MOUTH 2 (TWO) TIMES DAILY WITH A MEAL. (Patient not taking: Reported on 01/21/2020) 60 tablet 2  . predniSONE (DELTASONE) 10 MG tablet Day #1: Take 6 tablets by mouth Day #2: Take 5 tablets by mouth Day #3: Take 4 tablets by mouth Day #4: Take 3 tablets by mouth Day #5: Take 2 tablets by mouth Day #6: Take 1 tablet, then complete. (Patient not taking: Reported on 01/21/2020) 21 tablet 0  . traMADol-acetaminophen (ULTRACET) 37.5-325 MG tablet Take 1 tablet by mouth every 8 (eight) hours as needed for severe pain. (Patient not taking: Reported on 01/21/2020) 15 tablet 0  . Vitamin D, Ergocalciferol, (DRISDOL) 1.25 MG (50000 UT) CAPS capsule Take 1 capsule (50,000 Units total) by mouth every 7 (seven) days. (Patient not taking: Reported on 01/21/2020) 30 capsule 0  . COMBIVENT RESPIMAT 20-100 MCG/ACT AERS respimat INHALE 1 PUFF BY MOUTH EVERY 4 HOURS AS NEEDED (Patient  not taking: Reported on 01/21/2020) 36 g PRN   No facility-administered medications prior to visit.    Allergies  Allergen Reactions  . Statins Other (See Comments)    Leg cramps  . Sulfonamide Derivatives     REACTION: rash    ROS Review of Systems  Constitutional: Negative.   HENT: Negative.   Eyes: Negative.   Respiratory: Positive for cough (occasional ) and shortness of breath (occasional ).   Cardiovascular: Negative.   Gastrointestinal: Negative.   Endocrine: Negative.   Genitourinary: Negative.   Musculoskeletal: Positive for arthralgias (generalized joint pain ).       Left shoulder pain  Skin: Negative.   Allergic/Immunologic: Negative.  Neurological: Positive for dizziness (occasional ) and headaches (occasional ).  Hematological: Negative.   Psychiatric/Behavioral: Negative.       Objective:    Physical Exam  BP (!) 155/69 (BP Location: Right Arm, Patient Position: Sitting, Cuff Size: Normal)   Pulse 84   Temp 97.9 F (36.6 C)   Ht 5\' 2"  (1.575 m)   Wt 160 lb (72.6 kg)   BMI 29.26 kg/m  Wt Readings from Last 3 Encounters:  01/21/20 160 lb (72.6 kg)  04/29/19 170 lb (77.1 kg)  01/31/19 161 lb 6.4 oz (73.2 kg)     Health Maintenance Due  Topic Date Due  . Hepatitis C Screening  Never done  . HIV Screening  Never done  . TETANUS/TDAP  Never done  . PAP SMEAR-Modifier  Never done  . COLONOSCOPY  Never done  . MAMMOGRAM  12/01/2017  . INFLUENZA VACCINE  12/28/2019    There are no preventive care reminders to display for this patient.  Lab Results  Component Value Date   TSH 1.630 01/21/2020   Lab Results  Component Value Date   WBC 5.8 01/21/2020   HGB 12.6 01/21/2020   HCT 38.4 01/21/2020   MCV 81 01/21/2020   PLT 300 01/21/2020   Lab Results  Component Value Date   NA 136 01/21/2020   K 4.1 01/21/2020   CO2 25 01/21/2020   GLUCOSE 82 01/21/2020   BUN 12 01/21/2020   CREATININE 0.80 01/21/2020   BILITOT 0.2 01/21/2020    ALKPHOS 97 01/21/2020   AST 13 01/21/2020   ALT 11 01/21/2020   PROT 7.2 01/21/2020   ALBUMIN 4.7 01/21/2020   CALCIUM 10.4 (H) 01/21/2020   ANIONGAP 13 09/30/2017   GFR 105.95 12/30/2010   Lab Results  Component Value Date   CHOL 328 (H) 01/21/2020   Lab Results  Component Value Date   HDL 56 01/21/2020   Lab Results  Component Value Date   LDLCALC 239 (H) 01/21/2020   Lab Results  Component Value Date   TRIG 169 (H) 01/21/2020   Lab Results  Component Value Date   CHOLHDL 5.9 (H) 01/21/2020   Lab Results  Component Value Date   HGBA1C 5.8 (A) 01/21/2020   HGBA1C 5.8 01/21/2020   HGBA1C 5.8 01/21/2020   HGBA1C 5.8 01/21/2020      Assessment & Plan:   1. Left shoulder pain, unspecified chronicity Scheduled surgery for left shoulder pain.   2. Shoulder strain, left, initial encounter  3. Essential hypertension She will continue to take medications as prescribed, to decrease high sodium intake, excessive alcohol intake, increase potassium intake, smoking cessation, and increase physical activity of at least 30 minutes of cardio activity daily. She will continue to follow Heart Healthy or DASH diet. - CBC with Differential - Comprehensive metabolic panel - Lipid Panel - TSH - Vitamin B12 - Vitamin D, 25-hydroxy  4. Moderate asthma without complication, unspecified whether persistent Stable today. No signs or symptoms of respiratory distress noted or reported today.  - albuterol (PROVENTIL) (2.5 MG/3ML) 0.083% nebulizer solution; Take 3 mLs (2.5 mg total) by nebulization every 6 (six) hours as needed.  Dispense: 75 mL; Refill: prn - fluticasone-salmeterol (ADVAIR HFA) 115-21 MCG/ACT inhaler; INHALE 2 PUFFS INTO THE LUNGS 2 (TWO) TIMES DAILY.  Dispense: 12 g; Refill: 12 - COMBIVENT RESPIMAT 20-100 MCG/ACT AERS respimat; INHALE 1 PUFF BY MOUTH EVERY 4 HOURS AS NEEDED  Dispense: 36 g; Refill: PRN - albuterol (VENTOLIN HFA) 108 (90 Base)  MCG/ACT inhaler; Inhale 2  puffs into the lungs every 6 (six) hours as needed for wheezing or shortness of breath.  Dispense: 18 g; Refill: 12  5. Shortness of breath  6. Follow up She will follow up in 6 months.  - Urinalysis Dipstick - POC Glucose (CBG) - POC HgB A1c  Meds ordered this encounter  Medications  . albuterol (PROVENTIL) (2.5 MG/3ML) 0.083% nebulizer solution    Sig: Take 3 mLs (2.5 mg total) by nebulization every 6 (six) hours as needed.    Dispense:  75 mL    Refill:  prn  . fluticasone-salmeterol (ADVAIR HFA) 115-21 MCG/ACT inhaler    Sig: INHALE 2 PUFFS INTO THE LUNGS 2 (TWO) TIMES DAILY.    Dispense:  12 g    Refill:  12  . COMBIVENT RESPIMAT 20-100 MCG/ACT AERS respimat    Sig: INHALE 1 PUFF BY MOUTH EVERY 4 HOURS AS NEEDED    Dispense:  36 g    Refill:  PRN  . albuterol (VENTOLIN HFA) 108 (90 Base) MCG/ACT inhaler    Sig: Inhale 2 puffs into the lungs every 6 (six) hours as needed for wheezing or shortness of breath.    Dispense:  18 g    Refill:  12    Orders Placed This Encounter  Procedures  . CBC with Differential  . Comprehensive metabolic panel  . Lipid Panel  . TSH  . Vitamin B12  . Vitamin D, 25-hydroxy  . Urinalysis Dipstick  . POC Glucose (CBG)  . POC HgB A1c    Referral Orders  No referral(s) requested today    Raliegh Ip,  MSN, FNP-BC [facility] [address] [place], [place] [address] [phone] fax    Problem List Items Addressed This Visit      Respiratory   Moderate asthma without complication   Relevant Medications   albuterol (PROVENTIL) (2.5 MG/3ML) 0.083% nebulizer solution   fluticasone-salmeterol (ADVAIR HFA) 115-21 MCG/ACT inhaler   COMBIVENT RESPIMAT 20-100 MCG/ACT AERS respimat   albuterol (VENTOLIN HFA) 108 (90 Base) MCG/ACT inhaler     Musculoskeletal and Integument   Shoulder strain, left, initial encounter     Other    Shortness of breath    Other Visit Diagnoses    Left shoulder pain, unspecified chronicity    -  Primary   Essential hypertension       Relevant Orders   CBC with Differential (Completed)   Comprehensive metabolic panel (Completed)   Lipid Panel (Completed)   TSH (Completed)   Vitamin B12 (Completed)   Vitamin D, 25-hydroxy (Completed)   Follow up       Relevant Orders   Urinalysis Dipstick (Completed)   POC Glucose (CBG) (Completed)   POC HgB A1c (Completed)      Meds ordered this encounter  Medications  . albuterol (PROVENTIL) (2.5 MG/3ML) 0.083% nebulizer solution    Sig: Take 3 mLs (2.5 mg total) by nebulization every 6 (six) hours as needed.    Dispense:  75 mL    Refill:  prn  . fluticasone-salmeterol (ADVAIR HFA) 115-21 MCG/ACT inhaler    Sig: INHALE 2 PUFFS INTO THE LUNGS 2 (TWO) TIMES DAILY.    Dispense:  12 g    Refill:  12  . COMBIVENT RESPIMAT 20-100 MCG/ACT AERS respimat    Sig: INHALE 1 PUFF BY MOUTH EVERY 4 HOURS AS NEEDED    Dispense:  36 g  Refill:  PRN  . albuterol (VENTOLIN HFA) 108 (90 Base) MCG/ACT inhaler    Sig: Inhale 2 puffs into the lungs every 6 (six) hours as needed for wheezing or shortness of breath.    Dispense:  18 g    Refill:  12    Follow-up: No follow-ups on file.    Kallie Locks, FNP

## 2020-01-22 ENCOUNTER — Other Ambulatory Visit: Payer: Self-pay | Admitting: Family Medicine

## 2020-01-22 ENCOUNTER — Encounter: Payer: Self-pay | Admitting: Family Medicine

## 2020-01-22 DIAGNOSIS — E7800 Pure hypercholesterolemia, unspecified: Secondary | ICD-10-CM

## 2020-01-22 LAB — COMPREHENSIVE METABOLIC PANEL
ALT: 11 IU/L (ref 0–32)
AST: 13 IU/L (ref 0–40)
Albumin/Globulin Ratio: 1.9 (ref 1.2–2.2)
Albumin: 4.7 g/dL (ref 3.8–4.8)
Alkaline Phosphatase: 97 IU/L (ref 48–121)
BUN/Creatinine Ratio: 15 (ref 12–28)
BUN: 12 mg/dL (ref 8–27)
Bilirubin Total: 0.2 mg/dL (ref 0.0–1.2)
CO2: 25 mmol/L (ref 20–29)
Calcium: 10.4 mg/dL — ABNORMAL HIGH (ref 8.7–10.3)
Chloride: 98 mmol/L (ref 96–106)
Creatinine, Ser: 0.8 mg/dL (ref 0.57–1.00)
GFR calc Af Amer: 92 mL/min/{1.73_m2} (ref >59)
GFR calc non Af Amer: 80 mL/min/{1.73_m2} (ref >59)
Globulin, Total: 2.5 g/dL (ref 1.5–4.5)
Glucose: 82 mg/dL (ref 65–99)
Potassium: 4.1 mmol/L (ref 3.5–5.2)
Sodium: 136 mmol/L (ref 134–144)
Total Protein: 7.2 g/dL (ref 6.0–8.5)

## 2020-01-22 LAB — CBC WITH DIFFERENTIAL/PLATELET
Basophils Absolute: 0.1 10*3/uL (ref 0.0–0.2)
Basos: 1 %
EOS (ABSOLUTE): 0.1 10*3/uL (ref 0.0–0.4)
Eos: 2 %
Hematocrit: 38.4 % (ref 34.0–46.6)
Hemoglobin: 12.6 g/dL (ref 11.1–15.9)
Immature Grans (Abs): 0 10*3/uL (ref 0.0–0.1)
Immature Granulocytes: 0 %
Lymphocytes Absolute: 2.1 10*3/uL (ref 0.7–3.1)
Lymphs: 36 %
MCH: 26.4 pg — ABNORMAL LOW (ref 26.6–33.0)
MCHC: 32.8 g/dL (ref 31.5–35.7)
MCV: 81 fL (ref 79–97)
Monocytes Absolute: 0.6 10*3/uL (ref 0.1–0.9)
Monocytes: 10 %
Neutrophils Absolute: 2.9 10*3/uL (ref 1.4–7.0)
Neutrophils: 51 %
Platelets: 300 10*3/uL (ref 150–450)
RBC: 4.77 x10E6/uL (ref 3.77–5.28)
RDW: 13.2 % (ref 11.7–15.4)
WBC: 5.8 10*3/uL (ref 3.4–10.8)

## 2020-01-22 LAB — TSH: TSH: 1.63 u[IU]/mL (ref 0.450–4.500)

## 2020-01-22 LAB — LIPID PANEL
Chol/HDL Ratio: 5.9 ratio — ABNORMAL HIGH (ref 0.0–4.4)
Cholesterol, Total: 328 mg/dL — ABNORMAL HIGH (ref 100–199)
HDL: 56 mg/dL (ref >39)
LDL Chol Calc (NIH): 239 mg/dL — ABNORMAL HIGH (ref 0–99)
Triglycerides: 169 mg/dL — ABNORMAL HIGH (ref 0–149)
VLDL Cholesterol Cal: 33 mg/dL (ref 5–40)

## 2020-01-22 LAB — VITAMIN D 25 HYDROXY (VIT D DEFICIENCY, FRACTURES): Vit D, 25-Hydroxy: 22.8 ng/mL — ABNORMAL LOW (ref 30.0–100.0)

## 2020-01-22 LAB — VITAMIN B12: Vitamin B-12: 392 pg/mL (ref 232–1245)

## 2020-01-22 MED ORDER — EZETIMIBE 10 MG PO TABS: 10.0000 mg | ORAL_TABLET | Freq: Every day | ORAL | 3 refills | Status: DC

## 2020-01-22 MED ORDER — ALBUTEROL SULFATE (2.5 MG/3ML) 0.083% IN NEBU: 2.5000 mg | INHALATION_SOLUTION | Freq: Four times a day (QID) | RESPIRATORY_TRACT | 99 refills | Status: DC | PRN

## 2020-01-22 MED ORDER — ALBUTEROL SULFATE HFA 108 (90 BASE) MCG/ACT IN AERS: 2.0000 | INHALATION_SPRAY | Freq: Four times a day (QID) | RESPIRATORY_TRACT | 12 refills | Status: DC | PRN

## 2020-01-22 MED ORDER — COMBIVENT RESPIMAT 20-100 MCG/ACT IN AERS: INHALATION_SPRAY | RESPIRATORY_TRACT | 99 refills | Status: DC

## 2020-01-22 MED ORDER — ADVAIR HFA 115-21 MCG/ACT IN AERO: INHALATION_SPRAY | RESPIRATORY_TRACT | 12 refills | Status: DC

## 2020-01-22 MED FILL — ALBUTEROL 0.083% INHAL SOLN: (2.5 MG/3ML | 6 days supply | Qty: 75 | Fill #0

## 2020-01-22 MED FILL — ALBUTEROL SULFATE HFA 108 (: 108 (90 BAS | 25 days supply | Qty: 18 | Fill #0

## 2020-01-22 MED FILL — COMBIVENT RESPIMAT INHAL SP: 20-100 | 20 days supply | Qty: 4 | Fill #0

## 2020-01-23 MED FILL — EZETIMIBE 10 MG TABS: 10 | 30 days supply | Qty: 30 | Fill #0

## 2020-01-23 NOTE — Progress Notes (Signed)
Pt was called @ 8:28 to discuss her labs. Pt stated she understood her labs.

## 2020-02-06 ENCOUNTER — Other Ambulatory Visit: Payer: Self-pay

## 2020-02-06 ENCOUNTER — Telehealth: Payer: Self-pay

## 2020-02-06 DIAGNOSIS — J45909 Unspecified asthma, uncomplicated: Secondary | ICD-10-CM

## 2020-02-06 MED ORDER — ALBUTEROL SULFATE (2.5 MG/3ML) 0.083% IN NEBU: 2.5000 mg | INHALATION_SOLUTION | Freq: Four times a day (QID) | RESPIRATORY_TRACT | 99 refills | Status: DC | PRN

## 2020-02-06 MED ORDER — ALBUTEROL SULFATE HFA 108 (90 BASE) MCG/ACT IN AERS: 2.0000 | INHALATION_SPRAY | Freq: Four times a day (QID) | RESPIRATORY_TRACT | 12 refills | Status: DC | PRN

## 2020-02-06 MED FILL — ALBUTEROL SULFATE HFA 108 (: 108 (90 BAS | 25 days supply | Qty: 18 | Fill #0

## 2020-02-15 ENCOUNTER — Other Ambulatory Visit: Payer: Self-pay | Admitting: Family Medicine

## 2020-02-15 DIAGNOSIS — R52 Pain, unspecified: Secondary | ICD-10-CM

## 2020-02-15 DIAGNOSIS — M79601 Pain in right arm: Secondary | ICD-10-CM

## 2020-02-15 DIAGNOSIS — M5442 Lumbago with sciatica, left side: Secondary | ICD-10-CM

## 2020-03-01 NOTE — Telephone Encounter (Signed)
No note needd.

## 2020-03-18 ENCOUNTER — Ambulatory Visit: Payer: BC Managed Care – PPO

## 2020-03-18 ENCOUNTER — Other Ambulatory Visit: Payer: Self-pay

## 2020-03-18 VITALS — BP 147/72 | HR 84 | Temp 97.9°F | Ht 61.0 in | Wt 158.0 lb

## 2020-03-18 DIAGNOSIS — Z0000 Encounter for general adult medical examination without abnormal findings: Secondary | ICD-10-CM

## 2020-04-27 MED ORDER — NAPROXEN 500 MG PO TABS: 500.0000 mg | ORAL_TABLET | Freq: Two times a day (BID) | ORAL | 6 refills | Status: DC

## 2020-04-27 NOTE — Addendum Note (Signed)
Addended by: Eloise Levels on: 04/27/2020 10:17 AM   Modules accepted: Orders

## 2020-05-18 ENCOUNTER — Telehealth: Payer: Self-pay | Admitting: Family Medicine

## 2020-05-18 ENCOUNTER — Other Ambulatory Visit: Payer: Self-pay | Admitting: Family Medicine

## 2020-05-18 DIAGNOSIS — I10 Essential (primary) hypertension: Secondary | ICD-10-CM

## 2020-05-18 DIAGNOSIS — M5442 Lumbago with sciatica, left side: Secondary | ICD-10-CM

## 2020-05-18 DIAGNOSIS — R52 Pain, unspecified: Secondary | ICD-10-CM

## 2020-05-18 DIAGNOSIS — E7800 Pure hypercholesterolemia, unspecified: Secondary | ICD-10-CM

## 2020-05-18 DIAGNOSIS — M79601 Pain in right arm: Secondary | ICD-10-CM

## 2020-05-18 DIAGNOSIS — J45909 Unspecified asthma, uncomplicated: Secondary | ICD-10-CM

## 2020-05-18 DIAGNOSIS — R21 Rash and other nonspecific skin eruption: Secondary | ICD-10-CM

## 2020-05-18 MED ORDER — TRIAMTERENE-HCTZ 37.5-25 MG PO TABS: 1.0000 | ORAL_TABLET | Freq: Two times a day (BID) | ORAL | 3 refills | Status: DC

## 2020-05-18 MED ORDER — POTASSIUM CHLORIDE CRYS ER 20 MEQ PO TBCR: 20.0000 meq | EXTENDED_RELEASE_TABLET | Freq: Every day | ORAL | 1 refills | Status: DC

## 2020-05-18 MED ORDER — EZETIMIBE 10 MG PO TABS: 10.0000 mg | ORAL_TABLET | Freq: Every day | ORAL | 3 refills | Status: DC

## 2020-05-18 MED ORDER — ALBUTEROL SULFATE (2.5 MG/3ML) 0.083% IN NEBU: 2.5000 mg | INHALATION_SOLUTION | Freq: Four times a day (QID) | RESPIRATORY_TRACT | 3 refills | Status: DC | PRN

## 2020-05-18 MED ORDER — TRIAMCINOLONE ACETONIDE 0.1 % EX CREA: 1.0000 "application " | TOPICAL_CREAM | Freq: Two times a day (BID) | CUTANEOUS | 3 refills | Status: DC

## 2020-05-18 MED ORDER — CYCLOBENZAPRINE HCL 10 MG PO TABS: 10.0000 mg | ORAL_TABLET | Freq: Three times a day (TID) | ORAL | 6 refills | Status: DC | PRN

## 2020-05-18 MED ORDER — COMBIVENT RESPIMAT 20-100 MCG/ACT IN AERS: INHALATION_SPRAY | RESPIRATORY_TRACT | 99 refills | Status: DC

## 2020-05-18 MED ORDER — ADVAIR HFA 115-21 MCG/ACT IN AERO: INHALATION_SPRAY | RESPIRATORY_TRACT | 12 refills | Status: DC

## 2020-05-18 MED ORDER — NAPROXEN 500 MG PO TABS: 500.0000 mg | ORAL_TABLET | Freq: Two times a day (BID) | ORAL | 1 refills | Status: AC

## 2020-05-18 MED ORDER — ALBUTEROL SULFATE HFA 108 (90 BASE) MCG/ACT IN AERS: 2.0000 | INHALATION_SPRAY | Freq: Four times a day (QID) | RESPIRATORY_TRACT | 12 refills | Status: DC | PRN

## 2020-05-18 MED FILL — ALBUTEROL SUL 2.5 MG/3 ML S: (2.5 MG/3ML | 7 days supply | Qty: 90 | Fill #0

## 2020-05-18 MED FILL — TRIAMCINOLONE 0.1% CREAM: 0.1 | 15 days supply | Qty: 30 | Fill #0

## 2020-05-18 MED FILL — EZETIMIBE 10 MG TABS: 10 | 30 days supply | Qty: 30 | Fill #0

## 2020-05-18 MED FILL — CYCLOBENZAPRINE 10 MG TAB: 10 | 10 days supply | Qty: 30 | Fill #0

## 2020-05-18 MED FILL — POTASSIUM CL ER 20 MEQ TAB: 20 | 30 days supply | Qty: 30 | Fill #0

## 2020-05-18 MED FILL — NAPROXEN 500 MG TABLET: 500 | 30 days supply | Qty: 60 | Fill #0

## 2020-05-18 MED FILL — TRIAMTERENE-HCTZ 37.5-25 MG: 37.5-25 | 30 days supply | Qty: 60 | Fill #0

## 2020-05-18 MED FILL — ALBUTEROL SULFATE HFA 108 (: 108 (90 BAS | 25 days supply | Qty: 18 | Fill #0

## 2020-05-18 MED FILL — !ADVAIR HFA 115-21 MCG INHA: 115-21 | 30 days supply | Qty: 12 | Fill #0

## 2020-05-18 NOTE — Telephone Encounter (Signed)
Done

## 2020-05-19 MED FILL — COMBIVENT RESPIMAT INHAL SP: 20-100 | 30 days supply | Qty: 4 | Fill #0

## 2020-07-05 ENCOUNTER — Other Ambulatory Visit: Payer: Self-pay | Admitting: Pharmacy Technician

## 2020-07-05 MED FILL — TRIAMTERENE-HCTZ 37.5-25 MG: 37.5-25 | 30 days supply | Qty: 60 | Fill #0

## 2020-07-05 MED FILL — !ADVAIR HFA 115-21 MCG INHA: 115-21 | 30 days supply | Qty: 12 | Fill #0

## 2020-08-11 ENCOUNTER — Ambulatory Visit (INDEPENDENT_AMBULATORY_CARE_PROVIDER_SITE_OTHER): Payer: Self-pay | Admitting: Family Medicine

## 2020-08-11 ENCOUNTER — Other Ambulatory Visit: Payer: Self-pay

## 2020-08-11 ENCOUNTER — Other Ambulatory Visit: Payer: Self-pay | Admitting: Family Medicine

## 2020-08-11 ENCOUNTER — Encounter: Payer: Self-pay | Admitting: Family Medicine

## 2020-08-11 VITALS — BP 140/82 | HR 91 | Temp 97.7°F | Ht 61.0 in | Wt 169.8 lb

## 2020-08-11 DIAGNOSIS — R0602 Shortness of breath: Secondary | ICD-10-CM

## 2020-08-11 DIAGNOSIS — I10 Essential (primary) hypertension: Secondary | ICD-10-CM

## 2020-08-11 DIAGNOSIS — Z09 Encounter for follow-up examination after completed treatment for conditions other than malignant neoplasm: Secondary | ICD-10-CM

## 2020-08-11 DIAGNOSIS — M5442 Lumbago with sciatica, left side: Secondary | ICD-10-CM

## 2020-08-11 DIAGNOSIS — F419 Anxiety disorder, unspecified: Secondary | ICD-10-CM

## 2020-08-11 DIAGNOSIS — M25512 Pain in left shoulder: Secondary | ICD-10-CM

## 2020-08-11 DIAGNOSIS — J45909 Unspecified asthma, uncomplicated: Secondary | ICD-10-CM

## 2020-08-11 MED ORDER — PREDNISONE 10 MG PO TABS: ORAL_TABLET | ORAL | 0 refills | Status: DC

## 2020-08-11 MED ORDER — METHYLPREDNISOLONE SODIUM SUCC 125 MG IJ SOLR
125.0000 mg | Freq: Once | INTRAMUSCULAR | Status: AC
  Administered 2020-08-11: 125 mg via INTRAMUSCULAR

## 2020-08-11 NOTE — Progress Notes (Signed)
[facility]   Established Patient Office Visit  Subjective:  Patient ID: Heidi Tran, female    DOB: 02/14/1961  Age: 60 y.o. MRN: [id number]  CC:  Chief Complaint  Patient presents with  . Asthma  . Allergic Rhinitis     Having left breast pain and discharge , felt knot notice  couple months ago    HPI Heidi Tran is a 60 year old female who presents for Follow Up today.   Patient Active Problem List   Diagnosis Date Noted  . Shoulder strain, left, initial encounter 04/29/2019  . Pain 04/29/2019  . Right arm pain 04/29/2019  . Vitamin D deficiency 04/29/2019  . Low back pain with left-sided sciatica 01/31/2019  . Eczema 01/31/2019  . Moderate asthma without complication 08/14/2018  . Shortness of breath 07/30/2018  . Rash 07/30/2018  . GERD 02/11/2010  . HYPERTENSION 07/26/2008  . Seasonal and perennial allergic rhinitis 10/11/2007  . 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 ECZEMA 10/11/2007   Current Status: Since her last office visit, she has c/o left breast pain, lump, soreness/tenderness, and nipple discharge . Mammogram results are negative. She denies crusting, nipple inversion, or increased tenderness in breasts. Continue heart healthy diet. Keep follow up appointment at our office. Her anxiety is increased as she has recently had a death in the family, becoming the sole care-giver for her father, and other personal, medical and family stressors. She denies suicidal ideations, homicidal ideations, or auditory hallucinations. She denies fevers, chills, fatigue, recent infections, weight loss, and night sweats.  She has not had any headaches, visual changes, dizziness, and falls. Her asthma is stable today, but she has occasional shortness of breath. She is currently only using her short-acting inhaler, Albuterol, since she is not able to afford long-acting inhaler as this time. No chest pain, heart palpitations, cough  reported. Denies GI problems such as nausea, vomiting, diarrhea, and constipation. She has no reports of blood in stools, dysuria and hematuria. She is taking all medications as prescribed.    Past Medical History:  Diagnosis Date  . Allergic rhinitis, cause unspecified   . Asthma   . Hypercholesteremia 12/2019  . Hypertension   . Shoulder strain, left, initial encounter 04/2019  . Unspecified essential hypertension   . Vitamin D deficiency     Past Surgical History:  Procedure Laterality Date  . TOTAL ABDOMINAL HYSTERECTOMY W/ BILATERAL SALPINGOOPHORECTOMY      Family History  Problem Relation Age of Onset  . Cancer Mother   . Prostate cancer Father   . Heart disease Father   . Diabetes Father     Social History   Socioeconomic History  . Marital status: Single    Spouse name: Not on file  . Number of children: Not on file  . Years of education: Not on file  . Highest education level: Not on file  Occupational History  . Occupation: Diplomatic Services operational officer  Tobacco Use  . Smoking status: Never Smoker  . Smokeless tobacco: Never Used  Vaping Use  . Vaping Use: Never used  Substance and Sexual Activity  . Alcohol use: Yes    Comment: very rare  . Drug use: Never  . Sexual activity: Not Currently  Other Topics Concern  . Not on file  Social History Narrative  . Not on file   Social Determinants of Health   Financial Resource Strain: Not on file  Food Insecurity: Not on file  Transportation Needs: Not on  file  Physical Activity: Not on file  Stress: Not on file  Social Connections: Not on file  Intimate Partner Violence: Not on file    Outpatient Medications Prior to Visit  Medication Sig Dispense Refill  . albuterol (PROVENTIL) (2.5 MG/3ML) 0.083% nebulizer solution Take 3 mLs (2.5 mg total) by nebulization every 6 (six) hours as needed. 360 mL 3  . albuterol (VENTOLIN HFA) 108 (90 Base) MCG/ACT inhaler Inhale 2 puffs into the lungs every 6 (six) hours as needed for  wheezing or shortness of breath. 18 g 12  . fish oil-omega-3 fatty acids 1000 MG capsule Take 1 g by mouth daily.    . fluticasone-salmeterol (ADVAIR HFA) 115-21 MCG/ACT inhaler INHALE 2 PUFFS INTO THE LUNGS 2 (TWO) TIMES DAILY. 12 g 12  . hydrocortisone cream 1 % Apply 1 application topically 2 (two) times daily. 30 g 3  . Multiple Vitamin (MULTIVITAMIN) capsule Take 1 capsule by mouth daily. 30 capsule 6  . naproxen (NAPROSYN) 500 MG tablet Take 1 tablet (500 mg total) by mouth 2 (two) times daily with a meal. 180 tablet 1  . potassium chloride SA (KLOR-CON) 20 MEQ tablet Take 1 tablet (20 mEq total) by mouth daily. 90 tablet 1  . triamcinolone (KENALOG) 0.1 % Apply 1 application topically 2 (two) times daily. 30 g 3  . triamterene-hydrochlorothiazide (MAXZIDE-25) 37.5-25 MG tablet Take 1 tablet by mouth 2 (two) times daily. 180 tablet 3  . Vitamin D, Ergocalciferol, (DRISDOL) 1.25 MG (50000 UT) CAPS capsule Take 1 capsule (50,000 Units total) by mouth every 7 (seven) days. 30 capsule 0  . cyclobenzaprine (FLEXERIL) 10 MG tablet Take 1 tablet (10 mg total) by mouth 3 (three) times daily as needed for muscle spasms. (Patient not taking: Reported on 08/11/2020) 60 tablet 6  . ezetimibe (ZETIA) 10 MG tablet Take 1 tablet (10 mg total) by mouth daily. (Patient not taking: Reported on 08/11/2020) 90 tablet 3  . COMBIVENT RESPIMAT 20-100 MCG/ACT AERS respimat INHALE 1 PUFF BY MOUTH EVERY 4 HOURS AS NEEDED 36 g PRN  . predniSONE (DELTASONE) 10 MG tablet Day #1: Take 6 tablets by mouth Day #2: Take 5 tablets by mouth Day #3: Take 4 tablets by mouth Day #4: Take 3 tablets by mouth Day #5: Take 2 tablets by mouth Day #6: Take 1 tablet, then complete. (Patient not taking: Reported on 01/21/2020) 21 tablet 0  . traMADol-acetaminophen (ULTRACET) 37.5-325 MG tablet Take 1 tablet by mouth every 8 (eight) hours as needed for severe pain. (Patient not taking: Reported on 01/21/2020) 15 tablet 0   No  facility-administered medications prior to visit.    Allergies  Allergen Reactions  . Statins Other (See Comments)    Leg cramps  . Sulfonamide Derivatives     REACTION: rash    ROS Review of Systems  Constitutional: Negative.   HENT: Negative.   Eyes: Negative.   Respiratory: Positive for cough (occasional ) and shortness of breath. Stridor: occasional    Cardiovascular: Negative.   Gastrointestinal: Negative.   Endocrine: Negative.   Genitourinary: Negative.   Musculoskeletal: Positive for arthralgias (generalized. ).  Allergic/Immunologic: Negative.   Neurological: Positive for dizziness (occasional) and headaches (occasional ).  Hematological: Negative.   Psychiatric/Behavioral: Negative.       Objective:    Physical Exam Vitals and nursing note reviewed.  Constitutional:      Appearance: Normal appearance.  HENT:     Head: Normocephalic.     Nose: Nose normal.  Mouth/Throat:     Mouth: Mucous membranes are moist.     Pharynx: Oropharynx is clear.  Cardiovascular:     Rate and Rhythm: Normal rate and regular rhythm.     Pulses: Normal pulses.     Heart sounds: Normal heart sounds.  Pulmonary:     Effort: Pulmonary effort is normal.     Breath sounds: Wheezing (ascultated in all lung lobes) present.  Abdominal:     General: Bowel sounds are normal.     Palpations: Abdomen is soft.  Musculoskeletal:        General: Normal range of motion.     Cervical back: Normal range of motion and neck supple.  Skin:    General: Skin is warm and dry.  Neurological:     General: No focal deficit present.     Mental Status: She is alert and oriented to person, place, and time.  Psychiatric:        Mood and Affect: Mood normal.        Behavior: Behavior normal.        Thought Content: Thought content normal.        Judgment: Judgment normal.     BP 140/82 (BP Location: Left Arm, Patient Position: Sitting, Cuff Size: Normal)   Pulse 91   Temp 97.7 F (36.5 C)  (Temporal)   Ht  (1.549 m)   Wt 169 lb 12.8 oz (77 kg)   SpO2 99%   BMI 32.08 kg/m  Wt Readings from Last 3 Encounters:  08/11/20 169 lb 12.8 oz (77 kg)  03/18/20 158 lb (71.7 kg)  01/21/20 160 lb (72.6 kg)     Health Maintenance Due  Topic Date Due  . Hepatitis C Screening  Never done  . HIV Screening  Never done  . TETANUS/TDAP  Never done  . PAP SMEAR-Modifier  Never done  . COLONOSCOPY (Pts 45-74yrs Insurance coverage will need to be confirmed)  Never done  . MAMMOGRAM  12/01/2017    There are no preventive care reminders to display for this patient.  Lab Results  Component Value Date   TSH 1.630 01/21/2020   Lab Results  Component Value Date   WBC 5.8 01/21/2020   HGB 12.6 01/21/2020   HCT 38.4 01/21/2020   MCV 81 01/21/2020   PLT 300 01/21/2020   Lab Results  Component Value Date   NA 136 01/21/2020   K 4.1 01/21/2020   CO2 25 01/21/2020   GLUCOSE 82 01/21/2020   BUN 12 01/21/2020   CREATININE 0.80 01/21/2020   BILITOT 0.2 01/21/2020   ALKPHOS 97 01/21/2020   AST 13 01/21/2020   ALT 11 01/21/2020   PROT 7.2 01/21/2020   ALBUMIN 4.7 01/21/2020   CALCIUM 10.4 (H) 01/21/2020   ANIONGAP 13 09/30/2017   GFR 105.95 12/30/2010   Lab Results  Component Value Date   CHOL 328 (H) 01/21/2020   Lab Results  Component Value Date   HDL 56 01/21/2020   Lab Results  Component Value Date   LDLCALC 239 (H) 01/21/2020   Lab Results  Component Value Date   TRIG 169 (H) 01/21/2020   Lab Results  Component Value Date   CHOLHDL 5.9 (H) 01/21/2020   Lab Results  Component Value Date   HGBA1C 5.8 (A) 01/21/2020   HGBA1C 5.8 01/21/2020   HGBA1C 5.8 01/21/2020   HGBA1C 5.8 01/21/2020   Assessment & Plan:   1. Anxiety Moderate today. We will continue to monitor and  refer her to Psychiatry as needed.   2. Essential hypertension The current medical regimen is effective; blood pressure is stable at 140/82 today; continue present plan and medications  as prescribed. She will continue to take medications as prescribed, to decrease high sodium intake, excessive alcohol intake, increase potassium intake, smoking cessation, and increase physical activity of at least 30 minutes of cardio activity daily. She will continue to follow Heart Healthy or DASH diet.  3. Moderate asthma without complication, unspecified whether persistent Wheezes auscultated in all lungs today. We will initiate Prednisone Taper today. [facility] of free samples of long-acting inhalers. No signs or symptoms of respiratory distress noted or reported today. Monitor.  - methylPREDNISolone sodium succinate (SOLU-MEDROL) 125 mg/2 mL injection 125 mg - predniSONE (DELTASONE) 10 MG tablet; Day #1: Take 6 tablets by mouth Day #2: Take 5 tablets by mouth Day #3: Take 4 tablets by mouth Day #4: Take 3 tablets by mouth Day #5: Take 2 tablets by mouth Day #6: Take 1 tablet, then complete.  Dispense: 21 tablet; Refill: 0  4. Shortness of breath Stable. No signs or symptoms of respiratory distress noted or reported today. - predniSONE (DELTASONE) 10 MG tablet; Day #1: Take 6 tablets by mouth Day #2: Take 5 tablets by mouth Day #3: Take 4 tablets by mouth Day #4: Take 3 tablets by mouth Day #5: Take 2 tablets by mouth Day #6: Take 1 tablet, then complete.  Dispense: 21 tablet; Refill: 0  5. Left shoulder pain, unspecified chronicity  6. Low back pain with left-sided sciatica, unspecified back pain laterality, unspecified chronicity  7. Follow up She will follow up in 3 months .  Meds ordered this encounter  Medications  . methylPREDNISolone sodium succinate (SOLU-MEDROL) 125 mg/2 mL injection 125 mg  . predniSONE (DELTASONE) 10 MG tablet    Sig: Day #1: Take 6 tablets by mouth Day #2: Take 5 tablets by mouth Day #3: Take 4 tablets by mouth Day #4: Take 3 tablets by mouth Day #5: Take 2 tablets by mouth Day #6: Take 1  tablet, then complete.    Dispense:  21 tablet    Refill:  0   No orders of the defined types were placed in this encounter.   Referral Orders  No referral(s) requested today    Raliegh IpNatalie Osias Resnick, MSN, ANE, FNP-BC  [facility] [address]  [place], [place] [address] [phone] fax   Problem List Items Addressed This Visit      Respiratory   Moderate asthma without complication   Relevant Medications   methylPREDNISolone sodium succinate (SOLU-MEDROL) 125 mg/2 mL injection 125 mg (Start on 08/11/2020  3:30 PM)   predniSONE (DELTASONE) 10 MG tablet     Nervous and Auditory   Low back pain with left-sided sciatica   Relevant Medications   methylPREDNISolone sodium succinate (SOLU-MEDROL) 125 mg/2 mL injection 125 mg (Start on 08/11/2020  3:30 PM)   predniSONE (DELTASONE) 10 MG tablet     Other   Shortness of breath   Relevant Medications   predniSONE (DELTASONE) 10 MG tablet    Other Visit Diagnoses    Anxiety    -  Primary   Essential hypertension       Left shoulder pain, unspecified chronicity       Follow up          Meds ordered this encounter  Medications  . methylPREDNISolone sodium succinate (SOLU-MEDROL) 125 mg/2 mL injection 125 mg  . predniSONE (DELTASONE) 10 MG tablet    Sig: Day #1: Take 6 tablets by mouth Day #2: Take 5 tablets by mouth Day #3: Take 4 tablets by mouth Day #4: Take 3 tablets by mouth Day #5: Take 2 tablets by mouth Day #6: Take 1 tablet, then complete.    Dispense:  21 tablet    Refill:  0    Follow-up: No follow-ups on file.    Kallie Locks, FNP

## 2020-08-24 ENCOUNTER — Encounter: Payer: Self-pay | Admitting: Family Medicine

## 2020-08-28 ENCOUNTER — Other Ambulatory Visit: Payer: Self-pay

## 2020-08-28 MED FILL — Fluticasone-Salmeterol Inhal Aerosol 115-21 MCG/ACT: RESPIRATORY_TRACT | 30 days supply | Qty: 12 | Fill #0 | Status: CN

## 2020-08-29 ENCOUNTER — Other Ambulatory Visit: Payer: Self-pay

## 2020-08-31 ENCOUNTER — Other Ambulatory Visit: Payer: Self-pay

## 2020-09-06 ENCOUNTER — Other Ambulatory Visit: Payer: Self-pay

## 2020-09-06 MED FILL — Fluticasone-Salmeterol Inhal Aerosol 115-21 MCG/ACT: RESPIRATORY_TRACT | 30 days supply | Qty: 12 | Fill #0 | Status: CN

## 2020-09-06 MED FILL — Triamterene & Hydrochlorothiazide Tab 37.5-25 MG: ORAL | 90 days supply | Qty: 180 | Fill #0 | Status: AC

## 2020-09-06 MED FILL — Fluticasone-Salmeterol Inhal Aerosol 115-21 MCG/ACT: RESPIRATORY_TRACT | 30 days supply | Qty: 12 | Fill #0 | Status: AC

## 2020-09-07 ENCOUNTER — Other Ambulatory Visit: Payer: Self-pay

## 2020-10-28 ENCOUNTER — Other Ambulatory Visit: Payer: Self-pay

## 2020-10-28 MED FILL — Fluticasone-Salmeterol Inhal Aerosol 115-21 MCG/ACT: RESPIRATORY_TRACT | 90 days supply | Qty: 36 | Fill #1 | Status: AC

## 2020-10-28 MED FILL — Fluticasone-Salmeterol Inhal Aerosol 115-21 MCG/ACT: RESPIRATORY_TRACT | 30 days supply | Qty: 12 | Fill #1 | Status: CN

## 2020-11-01 ENCOUNTER — Other Ambulatory Visit: Payer: Self-pay

## 2020-11-12 ENCOUNTER — Encounter: Payer: Self-pay | Admitting: Nurse Practitioner

## 2020-11-12 ENCOUNTER — Other Ambulatory Visit: Payer: Self-pay

## 2020-11-12 ENCOUNTER — Ambulatory Visit (INDEPENDENT_AMBULATORY_CARE_PROVIDER_SITE_OTHER): Payer: Self-pay | Admitting: Nurse Practitioner

## 2020-11-12 ENCOUNTER — Ambulatory Visit: Payer: Self-pay | Admitting: Family Medicine

## 2020-11-12 VITALS — BP 138/75 | HR 87 | Temp 98.4°F | Ht 61.0 in | Wt 172.0 lb

## 2020-11-12 DIAGNOSIS — I10 Essential (primary) hypertension: Secondary | ICD-10-CM

## 2020-11-12 DIAGNOSIS — M62838 Other muscle spasm: Secondary | ICD-10-CM

## 2020-11-12 DIAGNOSIS — R200 Anesthesia of skin: Secondary | ICD-10-CM

## 2020-11-12 DIAGNOSIS — R202 Paresthesia of skin: Secondary | ICD-10-CM

## 2020-11-12 DIAGNOSIS — J4540 Moderate persistent asthma, uncomplicated: Secondary | ICD-10-CM

## 2020-11-12 NOTE — Progress Notes (Signed)
[facility] [address] [place], [place]  [address] Phone:  [phone]   Fax:  [phone]   Established Patient Office Visit  Subjective:  Patient ID: Heidi Tran, female    DOB: 03-29-1961  Age: 60 y.o. MRN: [id number]  CC: No chief complaint on file.   HPI Raneisha Raptis presents for follow up. She  has a past medical history of Allergic rhinitis, cause unspecified, Asthma, Hypercholesteremia (12/2019), Hypertension, Shoulder strain, left, initial encounter (04/2019), Unspecified essential hypertension, and Vitamin D deficiency.   Hypertension Patient is here for follow-up of elevated blood pressure. She is not exercising and is adherent to a low-salt diet. Blood pressure is not well controlled at home. This is due to stress. Cardiac symptoms: dyspnea. Patient denies chest pain, fatigue, irregular heart beat, near-syncope, orthopnea, palpitations, paroxysmal nocturnal dyspnea, syncope, and tachypnea. Cardiovascular risk factors: hypertension, obesity (BMI >= 30 kg/m2), and sedentary lifestyle. Use of agents associated with hypertension: NSAIDS. History of target organ damage: none. She is primary caregiver for her father he is 3 years. She admits   She is having some numbess and cramping in her foot. She has a history of back pain that goes into the toes. She feels like this is getting worse. She reports that she has been dehydrated .   She has Asthma and feels like this is controlled. She is using regimen but has used comibivent on occasion recently . This was and old prescriptions .She reports that they are to expensive. She is currently uninsured. This should change in the next few months.  Past Medical History:  Diagnosis Date   Allergic rhinitis, cause unspecified    Asthma    Hypercholesteremia 12/2019   Hypertension    Shoulder strain, left, initial encounter 04/2019   Unspecified essential hypertension    Vitamin D deficiency     Past  Surgical History:  Procedure Laterality Date   TOTAL ABDOMINAL HYSTERECTOMY W/ BILATERAL SALPINGOOPHORECTOMY      Family History  Problem Relation Age of Onset   Cancer Mother    Prostate cancer Father    Heart disease Father    Diabetes Father     Social History   Socioeconomic History   Marital status: Single    Spouse name: Not on file   Number of children: Not on file   Years of education: Not on file   Highest education level: Not on file  Occupational History   Occupation: Diplomatic Services operational officer  Tobacco Use   Smoking status: Never   Smokeless tobacco: Never  Vaping Use   Vaping Use: Never used  Substance and Sexual Activity   Alcohol use: Yes    Comment: very rare   Drug use: Never   Sexual activity: Not Currently  Other Topics Concern   Not on file  Social History Narrative   Not on file   Social Determinants of Health   Financial Resource Strain: Not on file  Food Insecurity: Not on file  Transportation Needs: Not on file  Physical Activity: Not on file  Stress: Not on file  Social Connections: Not on file  Intimate Partner Violence: Not on file    Outpatient Medications Prior to Visit  Medication Sig Dispense Refill   albuterol (PROVENTIL) (2.5 MG/3ML) 0.083% nebulizer solution TAKE 3 MLS (2.5 MG TOTAL) BY NEBULIZATION EVERY 6 (SIX) HOURS AS NEEDED. 360 mL 3   albuterol (VENTOLIN HFA) 108 (90 Base) MCG/ACT inhaler Inhale 2 puffs into the lungs every 6 (  six) hours as needed for wheezing or shortness of breath. 18 g 12   fish oil-omega-3 fatty acids 1000 MG capsule Take 1 g by mouth daily.     fluticasone-salmeterol (ADVAIR HFA) 115-21 MCG/ACT inhaler INHALE 2 PUFFS INTO THE LUNGS 2 (TWO) TIMES DAILY. 12 g 12   hydrocortisone cream 1 % Apply 1 application topically 2 (two) times daily. 30 g 3   Multiple Vitamin (MULTIVITAMIN) capsule Take 1 capsule by mouth daily. 30 capsule 6   naproxen (NAPROSYN) 500 MG tablet Take 1 tablet (500 mg total) by mouth 2 (two) times  daily with a meal. 180 tablet 1   potassium chloride SA (KLOR-CON) 20 MEQ tablet Take 1 tablet (20 mEq total) by mouth daily. 90 tablet 1   triamcinolone (KENALOG) 0.1 % Apply 1 application topically 2 (two) times daily. 30 g 3   triamterene-hydrochlorothiazide (MAXZIDE-25) 37.5-25 MG tablet TAKE 1 TABLET BY MOUTH 2 (TWO) TIMES DAILY. 180 tablet 3   Vitamin D, Ergocalciferol, (DRISDOL) 1.25 MG (50000 UT) CAPS capsule Take 1 capsule (50,000 Units total) by mouth every 7 (seven) days. 30 capsule 0   cyclobenzaprine (FLEXERIL) 10 MG tablet Take 1 tablet (10 mg total) by mouth 3 (three) times daily as needed for muscle spasms. (Patient not taking: No sig reported) 60 tablet 6   albuterol (PROVENTIL) (2.5 MG/3ML) 0.083% nebulizer solution Take 3 mLs (2.5 mg total) by nebulization every 6 (six) hours as needed. 360 mL 3   ezetimibe (ZETIA) 10 MG tablet Take 1 tablet (10 mg total) by mouth daily. (Patient not taking: Reported on 08/11/2020) 90 tablet 3   fluticasone-salmeterol (ADVAIR HFA) 115-21 MCG/ACT inhaler INHALE 2 PUFFS INTO THE LUNGS 2 (TWO) TIMES DAILY. 12 g 12   predniSONE (DELTASONE) 10 MG tablet Day #1: Take 6 tablets by mouth Day #2: Take 5 tablets by mouth Day #3: Take 4 tablets by mouth Day #4: Take 3 tablets by mouth Day #5: Take 2 tablets by mouth Day #6: Take 1 tablet, then complete. 21 tablet 0   predniSONE (DELTASONE) 10 MG tablet TAKE 6 TABS BY MOUTH ON DAY 1; 5 TABS ON DAY 2; 4 TABS ON DAY 3; 3 TABS ON DAY 4; 2 TABS ON DAY 5; 1 TAB ON DAY 6 THEN STOP 21 tablet 0   triamterene-hydrochlorothiazide (MAXZIDE-25) 37.5-25 MG tablet Take 1 tablet by mouth 2 (two) times daily. 180 tablet 3   No facility-administered medications prior to visit.    Allergies  Allergen Reactions   Statins Other (See Comments)    Leg cramps   Sulfonamide Derivatives     REACTION: rash    ROS Review of Systems    Objective:    Physical Exam Constitutional:      Appearance: She is obese.  HENT:      Head: Normocephalic and atraumatic.     Nose: Nose normal.     Mouth/Throat:     Mouth: Mucous membranes are moist.  Cardiovascular:     Rate and Rhythm: Normal rate and regular rhythm.     Pulses: Normal pulses.     Heart sounds: Normal heart sounds.  Pulmonary:     Effort: Pulmonary effort is normal.     Breath sounds: Normal breath sounds.  Abdominal:     General: Bowel sounds are normal.     Palpations: Abdomen is soft.  Musculoskeletal:        General: Normal range of motion.     Cervical back: Normal range of  motion.     Right lower leg: No edema.     Left lower leg: No edema.  Skin:    General: Skin is warm and dry.     Capillary Refill: Capillary refill takes less than 2 seconds.  Neurological:     General: No focal deficit present.     Mental Status: She is alert and oriented to person, place, and time.  Psychiatric:        Mood and Affect: Mood normal.        Behavior: Behavior normal.        Thought Content: Thought content normal.        Judgment: Judgment normal.    BP 138/75 (BP Location: Right Arm, Patient Position: Sitting)   Pulse 87   Temp 98.4 F (36.9 C)   Ht 5\' 1"  (1.549 m)   Wt 172 lb (78 kg)   SpO2 97%   BMI 32.50 kg/m  Wt Readings from Last 3 Encounters:  11/12/20 172 lb (78 kg)  08/11/20 169 lb 12.8 oz (77 kg)  03/18/20 158 lb (71.7 kg)     There are no preventive care reminders to display for this patient.   There are no preventive care reminders to display for this patient.  Lab Results  Component Value Date   TSH 1.630 01/21/2020   Lab Results  Component Value Date   WBC 5.8 01/21/2020   HGB 12.6 01/21/2020   HCT 38.4 01/21/2020   MCV 81 01/21/2020   PLT 300 01/21/2020   Lab Results  Component Value Date   NA 136 01/21/2020   K 4.1 01/21/2020   CO2 25 01/21/2020   GLUCOSE 82 01/21/2020   BUN 12 01/21/2020   CREATININE 0.80 01/21/2020   BILITOT 0.2 01/21/2020   ALKPHOS 97 01/21/2020   AST 13 01/21/2020   ALT  11 01/21/2020   PROT 7.2 01/21/2020   ALBUMIN 4.7 01/21/2020   CALCIUM 10.4 (H) 01/21/2020   ANIONGAP 13 09/30/2017   GFR 105.95 12/30/2010   Lab Results  Component Value Date   CHOL 328 (H) 01/21/2020   Lab Results  Component Value Date   HDL 56 01/21/2020   Lab Results  Component Value Date   LDLCALC 239 (H) 01/21/2020   Lab Results  Component Value Date   TRIG 169 (H) 01/21/2020   Lab Results  Component Value Date   CHOLHDL 5.9 (H) 01/21/2020   Lab Results  Component Value Date   HGBA1C 5.8 (A) 01/21/2020   HGBA1C 5.8 01/21/2020   HGBA1C 5.8 01/21/2020   HGBA1C 5.8 01/21/2020      Assessment & Plan:   Problem List Items Addressed This Visit   None Visit Diagnoses     Essential hypertension    -  Primary Stable  Encouraged on going compliance with current medication regimen Encouraged home monitoring and recording BP <130/80 Eating a heart-healthy diet with less salt Encouraged regular physical activity  Recommend Weight loss     Relevant Orders   Comp. Metabolic Panel (12)   Moderate persistent asthma, unspecified whether complicated     Controlled on current regimen   Night muscle spasms     Worsening labs pending for evaluation   Relevant Orders   Magnesium   Sedimentation rate   Vitamin B12   Numbness and tingling of right lower extremity       Relevant Orders   Magnesium   Sedimentation rate   Vitamin B12  No orders of the defined types were placed in this encounter.   Follow-up: Return in about 6 months (around 05/14/2021) for Follow up HTN [id number].    Barbette Merino, NP

## 2020-11-12 NOTE — Patient Instructions (Addendum)
Health Maintenance, Female Adopting a healthy lifestyle and getting preventive care are important in promoting health and wellness. Ask your health care provider about: The right schedule for you to have regular tests and exams. Things you can do on your own to prevent diseases and keep yourself healthy. What should I know about diet, weight, and exercise? Eat a healthy diet  Eat a diet that includes plenty of vegetables, fruits, low-fat dairy products, and lean protein. Do not eat a lot of foods that are high in solid fats, added sugars, or sodium.  Maintain a healthy weight Body mass index (BMI) is used to identify weight problems. It estimates body fat based on height and weight. Your health care provider can help determineyour BMI and help you achieve or maintain a healthy weight. Get regular exercise Get regular exercise. This is one of the most important things you can do for your health. Most adults should: Exercise for at least 150 minutes each week. The exercise should increase your heart rate and make you sweat (moderate-intensity exercise). Do strengthening exercises at least twice a week. This is in addition to the moderate-intensity exercise. Spend less time sitting. Even light physical activity can be beneficial. Watch cholesterol and blood lipids Have your blood tested for lipids and cholesterol at 62 years of age, then havethis test every 5 years. Have your cholesterol levels checked more often if: Your lipid or cholesterol levels are high. You are older than 62 years of age. You are at high risk for heart disease. What should I know about cancer screening? Depending on your health history and family history, you may need to have cancer screening at various ages. This may include screening for: Breast cancer. Cervical cancer. Colorectal cancer. Skin cancer. Lung cancer. What should I know about heart disease, diabetes, and high blood pressure? Blood pressure and heart  disease High blood pressure causes heart disease and increases the risk of stroke. This is more likely to develop in people who have high blood pressure readings, are of African descent, or are overweight. Have your blood pressure checked: Every 3-5 years if you are 18-39 years of age. Every year if you are 40 years old or older. Diabetes Have regular diabetes screenings. This checks your fasting blood sugar level. Have the screening done: Once every three years after age 40 if you are at a normal weight and have a low risk for diabetes. More often and at a younger age if you are overweight or have a high risk for diabetes. What should I know about preventing infection? Hepatitis B If you have a higher risk for hepatitis B, you should be screened for this virus. Talk with your health care provider to find out if you are at risk forhepatitis B infection. Hepatitis C Testing is recommended for: Everyone born from 1945 through 1965. Anyone with known risk factors for hepatitis C. Sexually transmitted infections (STIs) Get screened for STIs, including gonorrhea and chlamydia, if: You are sexually active and are younger than 62 years of age. You are older than 62 years of age and your health care provider tells you that you are at risk for this type of infection. Your sexual activity has changed since you were last screened, and you are at increased risk for chlamydia or gonorrhea. Ask your health care provider if you are at risk. Ask your health care provider about whether you are at high risk for HIV. Your health care provider may recommend a prescription medicine to help   prevent HIV infection. If you choose to take medicine to prevent HIV, you should first get tested for HIV. You should then be tested every 3 months for as long as you are taking the medicine. Pregnancy If you are about to stop having your period (premenopausal) and you may become pregnant, seek counseling before you get  pregnant. Take 400 to 800 micrograms (mcg) of folic acid every day if you become pregnant. Ask for birth control (contraception) if you want to prevent pregnancy. Osteoporosis and menopause Osteoporosis is a disease in which the bones lose minerals and strength with aging. This can result in bone fractures. If you are 65 years old or older, or if you are at risk for osteoporosis and fractures, ask your health care provider if you should: Be screened for bone loss. Take a calcium or vitamin D supplement to lower your risk of fractures. Be given hormone replacement therapy (HRT) to treat symptoms of menopause. Follow these instructions at home: Lifestyle Do not use any products that contain nicotine or tobacco, such as cigarettes, e-cigarettes, and chewing tobacco. If you need help quitting, ask your health care provider. Do not use street drugs. Do not share needles. Ask your health care provider for help if you need support or information about quitting drugs. Alcohol use Do not drink alcohol if: Your health care provider tells you not to drink. You are pregnant, may be pregnant, or are planning to become pregnant. If you drink alcohol: Limit how much you use to 0-1 drink a day. Limit intake if you are breastfeeding. Be aware of how much alcohol is in your drink. In the [place] one drink equals one 12 oz bottle of beer (355 mL), one 5 oz glass of wine (148 mL), or one 1 oz glass of hard liquor (44 mL). General instructions Schedule regular health, dental, and eye exams. Stay current with your vaccines. Tell your health care provider if: You often feel depressed. You have ever been abused or do not feel safe at home. Summary Adopting a healthy lifestyle and getting preventive care are important in promoting health and wellness. Follow your health care provider's instructions about healthy diet, exercising, and getting tested or screened for diseases. Follow your health care provider's  instructions on monitoring your cholesterol and blood pressure. This information is not intended to replace advice given to you by your health care provider. Make sure you discuss any questions you have with your healthcare provider. Document Revised: 05/08/2018 Document Reviewed: 05/08/2018 Elsevier Patient Education  2022 Elsevier Inc.  

## 2020-11-13 LAB — COMP. METABOLIC PANEL (12)
AST: 13 IU/L (ref 0–40)
Albumin/Globulin Ratio: 2 (ref 1.2–2.2)
Albumin: 4.8 g/dL (ref 3.8–4.8)
Alkaline Phosphatase: 107 IU/L (ref 44–121)
BUN/Creatinine Ratio: 16 (ref 12–28)
BUN: 14 mg/dL (ref 8–27)
Bilirubin Total: 0.3 mg/dL (ref 0.0–1.2)
Calcium: 10.1 mg/dL (ref 8.7–10.3)
Chloride: 100 mmol/L (ref 96–106)
Creatinine, Ser: 0.85 mg/dL (ref 0.57–1.00)
Globulin, Total: 2.4 g/dL (ref 1.5–4.5)
Glucose: 89 mg/dL (ref 65–99)
Potassium: 4 mmol/L (ref 3.5–5.2)
Sodium: 139 mmol/L (ref 134–144)
Total Protein: 7.2 g/dL (ref 6.0–8.5)
eGFR: 78 mL/min/{1.73_m2} (ref >59)

## 2020-11-13 LAB — SEDIMENTATION RATE: Sed Rate: 20 mm/hr (ref 0–40)

## 2020-11-13 LAB — MAGNESIUM: Magnesium: 1.9 mg/dL (ref 1.6–2.3)

## 2020-11-13 LAB — VITAMIN B12: Vitamin B-12: 371 pg/mL (ref 232–1245)

## 2020-11-17 ENCOUNTER — Other Ambulatory Visit: Payer: Self-pay

## 2020-11-19 ENCOUNTER — Other Ambulatory Visit: Payer: Self-pay

## 2021-02-02 ENCOUNTER — Other Ambulatory Visit: Payer: Self-pay

## 2021-02-15 ENCOUNTER — Other Ambulatory Visit: Payer: Self-pay

## 2021-02-28 ENCOUNTER — Other Ambulatory Visit: Payer: Self-pay

## 2021-02-28 MED FILL — Fluticasone-Salmeterol Inhal Aerosol 115-21 MCG/ACT: RESPIRATORY_TRACT | 90 days supply | Qty: 36 | Fill #2 | Status: AC

## 2021-03-01 ENCOUNTER — Other Ambulatory Visit: Payer: Self-pay

## 2021-03-24 ENCOUNTER — Other Ambulatory Visit: Payer: Self-pay

## 2021-03-24 ENCOUNTER — Ambulatory Visit (INDEPENDENT_AMBULATORY_CARE_PROVIDER_SITE_OTHER): Payer: Self-pay | Admitting: Nurse Practitioner

## 2021-03-24 DIAGNOSIS — Z23 Encounter for immunization: Secondary | ICD-10-CM

## 2021-03-24 NOTE — Progress Notes (Signed)
Patient in today for flu vaccine.

## 2021-04-18 ENCOUNTER — Telehealth: Payer: Self-pay

## 2021-04-18 NOTE — Telephone Encounter (Signed)
Attempted to call patient, call cannot be completed at this time. [facility] unable to leave a scheduling voice mail.

## 2021-05-13 ENCOUNTER — Other Ambulatory Visit: Payer: Self-pay

## 2021-05-25 ENCOUNTER — Ambulatory Visit: Payer: Self-pay | Admitting: Nurse Practitioner

## 2021-05-27 ENCOUNTER — Other Ambulatory Visit: Payer: Self-pay

## 2021-06-10 ENCOUNTER — Other Ambulatory Visit: Payer: Self-pay

## 2021-06-22 ENCOUNTER — Other Ambulatory Visit: Payer: Self-pay

## 2021-07-21 ENCOUNTER — Other Ambulatory Visit: Payer: Self-pay | Admitting: Nurse Practitioner

## 2021-07-22 ENCOUNTER — Other Ambulatory Visit: Payer: Self-pay | Admitting: Nurse Practitioner

## 2021-07-25 ENCOUNTER — Other Ambulatory Visit: Payer: Self-pay | Admitting: Nurse Practitioner

## 2021-07-25 ENCOUNTER — Other Ambulatory Visit: Payer: Self-pay

## 2021-07-25 MED ORDER — ADVAIR HFA 115-21 MCG/ACT IN AERO
2.0000 | INHALATION_SPRAY | Freq: Two times a day (BID) | RESPIRATORY_TRACT | 12 refills | Status: DC
  Filled 2021-07-25: qty 24, 60d supply, fill #0
  Filled 2021-10-19: qty 24, 60d supply, fill #1

## 2021-07-27 ENCOUNTER — Other Ambulatory Visit: Payer: Self-pay

## 2021-07-29 ENCOUNTER — Ambulatory Visit: Payer: Self-pay | Admitting: Nurse Practitioner

## 2021-08-01 ENCOUNTER — Encounter: Payer: Self-pay | Admitting: Nurse Practitioner

## 2021-08-01 ENCOUNTER — Ambulatory Visit (INDEPENDENT_AMBULATORY_CARE_PROVIDER_SITE_OTHER): Payer: Self-pay | Admitting: Nurse Practitioner

## 2021-08-01 ENCOUNTER — Other Ambulatory Visit: Payer: Self-pay

## 2021-08-01 VITALS — BP 167/96 | HR 82 | Temp 98.0°F | Ht 61.0 in | Wt 170.0 lb

## 2021-08-01 DIAGNOSIS — N6325 Unspecified lump in the left breast, overlapping quadrants: Secondary | ICD-10-CM

## 2021-08-01 DIAGNOSIS — R7303 Prediabetes: Secondary | ICD-10-CM

## 2021-08-01 DIAGNOSIS — E7800 Pure hypercholesterolemia, unspecified: Secondary | ICD-10-CM

## 2021-08-01 DIAGNOSIS — I10 Essential (primary) hypertension: Secondary | ICD-10-CM

## 2021-08-01 DIAGNOSIS — Z1329 Encounter for screening for other suspected endocrine disorder: Secondary | ICD-10-CM

## 2021-08-01 DIAGNOSIS — Z803 Family history of malignant neoplasm of breast: Secondary | ICD-10-CM

## 2021-08-01 MED ORDER — TRIAMTERENE-HCTZ 37.5-25 MG PO TABS
1.0000 | ORAL_TABLET | Freq: Every day | ORAL | 1 refills | Status: DC
  Filled 2021-08-01: qty 30, 30d supply, fill #0
  Filled 2021-09-20: qty 30, 30d supply, fill #1
  Filled 2021-10-19: qty 30, 30d supply, fill #2

## 2021-08-01 NOTE — Patient Instructions (Addendum)
[facility]  ?[phone]  ? ?Managing Your Hypertension ?Hypertension, also called high blood pressure, is when the force of the blood pressing against the walls of the arteries is too strong. Arteries are blood vessels that carry blood from your heart throughout your body. Hypertension forces the heart to work harder to pump blood and may cause the arteries to become narrow or stiff. ?Understanding blood pressure readings ?Your personal target blood pressure may vary depending on your medical conditions, your age, and other factors. A blood pressure reading includes a higher number over a lower number. Ideally, your blood pressure should be below 120/80. You should know that: ?The first, or top, number is called the systolic pressure. It is a measure of the pressure in your arteries as your heart beats. ?The second, or bottom number, is called the diastolic pressure. It is a measure of the pressure in your arteries as the heart relaxes. ?Blood pressure is classified into four stages. Based on your blood pressure reading, your health care provider may use the following stages to determine what type of treatment you need, if any. Systolic pressure and diastolic pressure are measured in a unit called mmHg. ?Normal ?Systolic pressure: below 120. ?Diastolic pressure: below 80. ?Elevated ?Systolic pressure: 120-129. ?Diastolic pressure: below 80. ?Hypertension stage 1 ?Systolic pressure: 130-139. ?Diastolic pressure: 80-89. ?Hypertension stage 2 ?Systolic pressure: 140 or above. ?Diastolic pressure: 90 or above. ?How can this condition affect me? ?Managing your hypertension is an important responsibility. Over time, hypertension can damage the arteries and decrease blood flow to important parts of the body, including the brain, heart, and kidneys. Having untreated or uncontrolled hypertension can lead to: ?A heart attack. ?A stroke. ?A weakened blood vessel (aneurysm). ?Heart failure. ?Kidney  damage. ?Eye damage. ?Metabolic syndrome. ?Memory and concentration problems. ?Vascular dementia. ?What actions can I take to manage this condition? ?Hypertension can be managed by making lifestyle changes and possibly by taking medicines. Your health care provider will help you make a plan to bring your blood pressure within a normal range. ?Nutrition ? ?Eat a diet that is high in fiber and potassium, and low in salt (sodium), added sugar, and fat. An example eating plan is called the Dietary Approaches to Stop Hypertension (DASH) diet. To eat this way: ?Eat plenty of fresh fruits and vegetables. Try to fill one-half of your plate at each meal with fruits and vegetables. ?Eat whole grains, such as whole-wheat pasta, brown rice, or whole-grain bread. Fill about one-fourth of your plate with whole grains. ?Eat low-fat dairy products. ?Avoid fatty cuts of meat, processed or cured meats, and poultry with skin. Fill about one-fourth of your plate with lean proteins such as fish, chicken without skin, beans, eggs, and tofu. ?Avoid pre-made and processed foods. These tend to be higher in sodium, added sugar, and fat. ?Reduce your daily sodium intake. Most people with hypertension should eat less than 1,500 mg of sodium a day. ?Lifestyle ? ?Work with your health care provider to maintain a healthy body weight or to lose weight. Ask what an ideal weight is for you. ?Get at least 30 minutes of exercise that causes your heart to beat faster (aerobic exercise) most days of the week. Activities may include walking, swimming, or biking. ?Include exercise to strengthen your muscles (resistance exercise), such as weight lifting, as part of your weekly exercise routine. Try to do these types of exercises for 30 minutes at least 3 days a week. ?Do not use any products that  contain nicotine or tobacco, such as cigarettes, e-cigarettes, and chewing tobacco. If you need help quitting, ask your health care provider. ?Control any  long-term (chronic) conditions you have, such as high cholesterol or diabetes. ?Identify your sources of stress and find ways to manage stress. This may include meditation, deep breathing, or making time for fun activities. ?Alcohol use ?Do not drink alcohol if: ?Your health care provider tells you not to drink. ?You are pregnant, may be pregnant, or are planning to become pregnant. ?If you drink alcohol: ?Limit how much you use to: ?0-1 drink a day for women. ?0-2 drinks a day for men. ?Be aware of how much alcohol is in your drink. In the [place] one drink equals one 12 oz bottle of beer (355 mL), one 5 oz glass of wine (148 mL), or one 1? oz glass of hard liquor (44 mL). ?Medicines ?Your health care provider may prescribe medicine if lifestyle changes are not enough to get your blood pressure under control and if: ?Your systolic blood pressure is 130 or higher. ?Your diastolic blood pressure is 80 or higher. ?Take medicines only as told by your health care provider. Follow the directions carefully. Blood pressure medicines must be taken as told by your health care provider. The medicine does not work as well when you skip doses. Skipping doses also puts you at risk for problems. ?Monitoring ?Before you monitor your blood pressure: ?Do not smoke, drink caffeinated beverages, or exercise within 30 minutes before taking a measurement. ?Use the bathroom and empty your bladder (urinate). ?Sit quietly for at least 5 minutes before taking measurements. ?Monitor your blood pressure at home as told by your health care provider. To do this: ?Sit with your back straight and supported. ?Place your feet flat on the floor. Do not cross your legs. ?Support your arm on a flat surface, such as a table. Make sure your upper arm is at heart level. ?Each time you measure, take two or three readings one minute apart and record the results. ?You may also need to have your blood pressure checked regularly by your health care  provider. ?General information ?Talk with your health care provider about your diet, exercise habits, and other lifestyle factors that may be contributing to hypertension. ?Review all the medicines you take with your health care provider because there may be side effects or interactions. ?Keep all visits as told by your health care provider. Your health care provider can help you create and adjust your plan for managing your high blood pressure. ?Where to find more information ?National Heart, Lung, and Blood Institute: PopSteam.is ?American Heart Association: www.heart.org ?Contact a health care provider if: ?You think you are having a reaction to medicines you have taken. ?You have repeated (recurrent) headaches. ?You feel dizzy. ?You have swelling in your ankles. ?You have trouble with your vision. ?Get help right away if: ?You develop a severe headache or confusion. ?You have unusual weakness or numbness, or you feel faint. ?You have severe pain in your chest or abdomen. ?You vomit repeatedly. ?You have trouble breathing. ?These symptoms may represent a serious problem that is an emergency. Do not wait to see if the symptoms will go away. Get medical help right away. Call your local emergency services (911 in the [place] Do not drive yourself to the hospital. ?Summary ?Hypertension is when the force of blood pumping through your arteries is too strong. If this condition is not controlled, it may put you at risk for serious complications. ?Your  personal target blood pressure may vary depending on your medical conditions, your age, and other factors. For most people, a normal blood pressure is less than 120/80. ?Hypertension is managed by lifestyle changes, medicines, or both. ?Lifestyle changes to help manage hypertension include losing weight, eating a healthy, low-sodium diet, exercising more, stopping smoking, and limiting alcohol. ?This information is not intended to replace advice given to you by your  health care provider. Make sure you discuss any questions you have with your health care provider. ?Document Revised: 06/02/2019 Document Reviewed: 04/15/2019 ?Elsevier Patient Education ? 2022 Elsevier Inc. ? ?

## 2021-08-01 NOTE — Progress Notes (Signed)
[address] [place], [place]  [address] Phone:  [phone]   Fax:  [phone]   Established Patient Office Visit  Subjective:  Patient ID: Heidi Tran, female    DOB: [birth year]  Age: 61 y.o. MRN: [id number]  CC:  Chief Complaint  Patient presents with   Follow-up    Pt is here for 6 month follow up. Pt stated she has soreness and a lump in the left breast, left feet has numbness at night. Pt need refill on blood pressure medication pt stated she has not taken her BP medication in month.    HPI Heidi Tran presents for follow up. She  has a past medical history of Allergic rhinitis, cause unspecified, Asthma, Hypercholesteremia (12/2019), Hypertension, Shoulder strain, left, initial encounter (04/2019), Unspecified essential hypertension, and Vitamin D deficiency.   [name] is in today for follow up for Hypertension. The current prescribed treatment is Maxzide  Compliance is reported and home blood pressure monitoring is not done. She was able to tell her BP was elevated. The  DASH diet is being followed. An exercise regimen is not ongoing. There is a goal to get BP back controlled. Denies headache, dizziness, visual changes, shortness of breath, dyspnea on exertion, chest pain, nausea, vomiting or any edema.  She is caring for her father.  Past Medical History:  Diagnosis Date   Allergic rhinitis, cause unspecified    Asthma    Hypercholesteremia 12/2019   Hypertension    Shoulder strain, left, initial encounter 04/2019   Unspecified essential hypertension    Vitamin D deficiency     Past Surgical History:  Procedure Laterality Date   TOTAL ABDOMINAL HYSTERECTOMY W/ BILATERAL SALPINGOOPHORECTOMY      Family History  Problem Relation Age of Onset   Cancer Mother    Prostate cancer Father    Heart disease Father    Diabetes Father     Social History   Socioeconomic History   Marital status: Single    Spouse name: Not on  file   Number of children: Not on file   Years of education: Not on file   Highest education level: Not on file  Occupational History   Occupation: Network engineer  Tobacco Use   Smoking status: Never   Smokeless tobacco: Never  Vaping Use   Vaping Use: Never used  Substance and Sexual Activity   Alcohol use: Yes    Comment: very rare   Drug use: Never   Sexual activity: Not Currently  Other Topics Concern   Not on file  Social History Narrative   Not on file   Social Determinants of Health   Financial Resource Strain: Not on file  Food Insecurity: Not on file  Transportation Needs: Not on file  Physical Activity: Not on file  Stress: Not on file  Social Connections: Not on file  Intimate Partner Violence: Not on file    Outpatient Medications Prior to Visit  Medication Sig Dispense Refill   albuterol (VENTOLIN HFA) 108 (90 Base) MCG/ACT inhaler Inhale 2 puffs into the lungs every 6 (six) hours as needed for wheezing or shortness of breath. 18 g 12   fluticasone-salmeterol (ADVAIR HFA) 115-21 MCG/ACT inhaler INHALE 2 PUFFS INTO THE LUNGS 2 (TWO) TIMES DAILY. 12 g 12   hydrocortisone cream 1 % Apply 1 application topically 2 (two) times daily. 30 g 3   Multiple Vitamin (MULTIVITAMIN) capsule Take 1 capsule by mouth daily. 30 capsule 6  naproxen (NAPROSYN) 500 MG tablet Take 1 tablet (500 mg total) by mouth 2 (two) times daily with a meal. 180 tablet 1   triamcinolone (KENALOG) 0.1 % Apply 1 application topically 2 (two) times daily. 30 g 3   albuterol (PROVENTIL) (2.5 MG/3ML) 0.083% nebulizer solution TAKE 3 MLS (2.5 MG TOTAL) BY NEBULIZATION EVERY 6 (SIX) HOURS AS NEEDED. 360 mL 3   fish oil-omega-3 fatty acids 1000 MG capsule Take 1 g by mouth daily. (Patient not taking: Reported on 08/01/2021)     potassium chloride SA (KLOR-CON) 20 MEQ tablet Take 1 tablet (20 mEq total) by mouth daily. (Patient not taking: Reported on 08/01/2021) 90 tablet 1   Vitamin D, Ergocalciferol,  (DRISDOL) 1.25 MG (50000 UT) CAPS capsule Take 1 capsule (50,000 Units total) by mouth every 7 (seven) days. (Patient not taking: Reported on 08/01/2021) 30 capsule 0   cyclobenzaprine (FLEXERIL) 10 MG tablet Take 1 tablet (10 mg total) by mouth 3 (three) times daily as needed for muscle spasms. 60 tablet 6   triamterene-hydrochlorothiazide (MAXZIDE-25) 37.5-25 MG tablet TAKE 1 TABLET BY MOUTH 2 (TWO) TIMES DAILY. 180 tablet 3   No facility-administered medications prior to visit.    Allergies  Allergen Reactions   Statins Other (See Comments)    Leg cramps   Shellfish Allergy Swelling    Throat, lips and tongue swelling   Sulfonamide Derivatives     REACTION: rash    ROS Review of Systems    Objective:    Physical Exam Constitutional:      Appearance: She is obese.  HENT:     Head: Normocephalic and atraumatic.     Nose: Nose normal.  Cardiovascular:     Rate and Rhythm: Normal rate and regular rhythm.     Pulses: Normal pulses.     Heart sounds: Normal heart sounds.  Pulmonary:     Effort: Pulmonary effort is normal.     Breath sounds: Normal breath sounds.  Abdominal:     General: Bowel sounds are normal.     Palpations: Abdomen is soft.  Musculoskeletal:        General: Normal range of motion.     Cervical back: Normal range of motion.     Right lower leg: No edema.     Left lower leg: No edema.  Skin:    General: Skin is warm and dry.     Capillary Refill: Capillary refill takes less than 2 seconds.  Neurological:     General: No focal deficit present.     Mental Status: She is alert and oriented to person, place, and time.  Psychiatric:        Mood and Affect: Mood normal.        Behavior: Behavior normal.        Thought Content: Thought content normal.        Judgment: Judgment normal.    BP (!) 167/96    Pulse 82    Temp 98 F (36.7 C)    Ht '5\' 1"'  (1.549 m)    Wt 170 lb 0.4 oz (77.1 kg)    SpO2 100%    BMI 32.13 kg/m  Wt Readings from Last 3  Encounters:  08/01/21 170 lb 0.4 oz (77.1 kg)  11/12/20 172 lb (78 kg)  08/11/20 169 lb 12.8 oz (77 kg)     There are no preventive care reminders to display for this patient.   There are no preventive care reminders to display for  this patient.  Lab Results  Component Value Date   TSH 1.630 01/21/2020   Lab Results  Component Value Date   WBC 5.8 01/21/2020   HGB 12.6 01/21/2020   HCT 38.4 01/21/2020   MCV 81 01/21/2020   PLT 300 01/21/2020   Lab Results  Component Value Date   NA 139 11/12/2020   K 4.0 11/12/2020   CO2 25 01/21/2020   GLUCOSE 89 11/12/2020   BUN 14 11/12/2020   CREATININE 0.85 11/12/2020   BILITOT 0.3 11/12/2020   ALKPHOS 107 11/12/2020   AST 13 11/12/2020   ALT 11 01/21/2020   PROT 7.2 11/12/2020   ALBUMIN 4.8 11/12/2020   CALCIUM 10.1 11/12/2020   ANIONGAP 13 09/30/2017   EGFR 78 11/12/2020   GFR 105.95 12/30/2010   Lab Results  Component Value Date   CHOL 328 (H) 01/21/2020   Lab Results  Component Value Date   HDL 56 01/21/2020   Lab Results  Component Value Date   LDLCALC 239 (H) 01/21/2020   Lab Results  Component Value Date   TRIG 169 (H) 01/21/2020   Lab Results  Component Value Date   CHOLHDL 5.9 (H) 01/21/2020   Lab Results  Component Value Date   HGBA1C 5.8 (A) 01/21/2020   HGBA1C 5.8 01/21/2020   HGBA1C 5.8 01/21/2020   HGBA1C 5.8 01/21/2020      Assessment & Plan:   Problem List Items Addressed This Visit   None Visit Diagnoses     Mass overlapping multiple quadrants of left breast    -  Primary   Relevant Orders   MM Digital Diagnostic Bilat   Family history of breast cancer in mother       Relevant Orders   MM Digital Diagnostic Bilat   Essential hypertension     Elevated  Encouraged on going compliance with current medication regimen Encouraged home monitoring and recording BP <130/80 Eating a heart-healthy diet with less salt Encouraged regular physical activity  Recommend Weight loss      Relevant Medications   triamterene-hydrochlorothiazide (MAXZIDE-25) 37.5-25 MG tablet   Other Relevant Orders   Comp. Metabolic Panel (12)   Hypercholesteremia     Stable  Encouraged on going compliance with current medication regimen. I recommend eating a heart-healthy diet; low in fat and cholesterol along with regular exercise. This will promote weight loss and improve cholesterol.     Relevant Medications   triamterene-hydrochlorothiazide (MAXZIDE-25) 37.5-25 MG tablet   Other Relevant Orders   Lipid panel   Screening for thyroid disorder       Relevant Orders   TSH   Prediabetes     Consider home glucose monitoring Weight loss at least 5% of current body weight is can be achieved with lifestyle modification dietary changes and regular daily exercise Encourage blood pressure control goal <120/80 and maintaining total cholesterol <200 Follow-up every 3 to 6 months for reevaluation Education material provided        Meds ordered this encounter  Medications   triamterene-hydrochlorothiazide (MAXZIDE-25) 37.5-25 MG tablet    Sig: Take 1 tablet by mouth daily.    Dispense:  90 tablet    Refill:  1    Order Specific Question:   Supervising Provider    Answer:   Tresa Garter [id number]    Follow-up: Return in about 3 months (around 11/01/2021) for Follow up HTN [id number].    Vevelyn Francois, NP

## 2021-08-02 LAB — LIPID PANEL
Chol/HDL Ratio: 6 ratio — ABNORMAL HIGH (ref 0.0–4.4)
Cholesterol, Total: 328 mg/dL — ABNORMAL HIGH (ref 100–199)
HDL: 55 mg/dL (ref >39)
LDL Chol Calc (NIH): 248 mg/dL — ABNORMAL HIGH (ref 0–99)
Triglycerides: 137 mg/dL (ref 0–149)
VLDL Cholesterol Cal: 25 mg/dL (ref 5–40)

## 2021-08-02 LAB — COMP. METABOLIC PANEL (12)
AST: 11 IU/L (ref 0–40)
Albumin/Globulin Ratio: 2 (ref 1.2–2.2)
Albumin: 4.9 g/dL — ABNORMAL HIGH (ref 3.8–4.8)
Alkaline Phosphatase: 115 IU/L (ref 44–121)
BUN/Creatinine Ratio: 15 (ref 12–28)
BUN: 13 mg/dL (ref 8–27)
Bilirubin Total: 0.4 mg/dL (ref 0.0–1.2)
Calcium: 10.2 mg/dL (ref 8.7–10.3)
Chloride: 102 mmol/L (ref 96–106)
Creatinine, Ser: 0.85 mg/dL (ref 0.57–1.00)
Globulin, Total: 2.5 g/dL (ref 1.5–4.5)
Glucose: 78 mg/dL (ref 70–99)
Potassium: 4.1 mmol/L (ref 3.5–5.2)
Sodium: 143 mmol/L (ref 134–144)
Total Protein: 7.4 g/dL (ref 6.0–8.5)
eGFR: 77 mL/min/{1.73_m2} (ref >59)

## 2021-08-02 LAB — TSH: TSH: 0.513 u[IU]/mL (ref 0.450–4.500)

## 2021-08-23 ENCOUNTER — Other Ambulatory Visit: Payer: Self-pay

## 2021-08-23 DIAGNOSIS — Z1231 Encounter for screening mammogram for malignant neoplasm of breast: Secondary | ICD-10-CM

## 2021-09-20 ENCOUNTER — Other Ambulatory Visit: Payer: Self-pay

## 2021-09-21 ENCOUNTER — Other Ambulatory Visit: Payer: Self-pay

## 2021-10-19 ENCOUNTER — Other Ambulatory Visit: Payer: Self-pay | Admitting: Nurse Practitioner

## 2021-10-19 ENCOUNTER — Other Ambulatory Visit: Payer: Self-pay

## 2021-10-19 DIAGNOSIS — J45909 Unspecified asthma, uncomplicated: Secondary | ICD-10-CM

## 2021-10-19 MED ORDER — FLUTICASONE-SALMETEROL 100-50 MCG/ACT IN AEPB
1.0000 | INHALATION_SPRAY | Freq: Two times a day (BID) | RESPIRATORY_TRACT | 2 refills | Status: DC
  Filled 2021-10-19: qty 60, 30d supply, fill #0
  Filled 2022-01-02 – 2022-01-09 (×2): qty 60, 30d supply, fill #1

## 2021-10-21 ENCOUNTER — Other Ambulatory Visit: Payer: Self-pay

## 2021-10-26 ENCOUNTER — Other Ambulatory Visit: Payer: Self-pay

## 2021-11-01 ENCOUNTER — Ambulatory Visit (INDEPENDENT_AMBULATORY_CARE_PROVIDER_SITE_OTHER): Payer: Self-pay | Admitting: Nurse Practitioner

## 2021-11-01 ENCOUNTER — Encounter: Payer: Self-pay | Admitting: Nurse Practitioner

## 2021-11-01 ENCOUNTER — Other Ambulatory Visit: Payer: Self-pay

## 2021-11-01 VITALS — BP 155/89 | HR 83 | Temp 97.9°F | Ht 61.0 in | Wt 173.5 lb

## 2021-11-01 DIAGNOSIS — R0602 Shortness of breath: Secondary | ICD-10-CM

## 2021-11-01 DIAGNOSIS — J45909 Unspecified asthma, uncomplicated: Secondary | ICD-10-CM

## 2021-11-01 DIAGNOSIS — I10 Essential (primary) hypertension: Secondary | ICD-10-CM

## 2021-11-01 DIAGNOSIS — K0889 Other specified disorders of teeth and supporting structures: Secondary | ICD-10-CM

## 2021-11-01 MED ORDER — TRIAMTERENE-HCTZ 37.5-25 MG PO TABS
1.0000 | ORAL_TABLET | Freq: Every day | ORAL | 1 refills | Status: DC
  Filled 2021-11-01: qty 90, 90d supply, fill #0
  Filled 2021-11-30: qty 30, 30d supply, fill #0
  Filled 2022-01-02: qty 30, 30d supply, fill #1

## 2021-11-01 MED ORDER — ALBUTEROL SULFATE HFA 108 (90 BASE) MCG/ACT IN AERS
2.0000 | INHALATION_SPRAY | Freq: Four times a day (QID) | RESPIRATORY_TRACT | 12 refills | Status: DC | PRN
  Filled 2021-11-01: qty 18, 25d supply, fill #0

## 2021-11-01 MED ORDER — AMLODIPINE BESYLATE 10 MG PO TABS
10.0000 mg | ORAL_TABLET | Freq: Every day | ORAL | 0 refills | Status: DC
  Filled 2021-11-01: qty 90, 90d supply, fill #0

## 2021-11-01 MED ORDER — ALBUTEROL SULFATE (2.5 MG/3ML) 0.083% IN NEBU
INHALATION_SOLUTION | RESPIRATORY_TRACT | 3 refills | Status: DC
  Filled 2021-11-01: qty 360, 30d supply, fill #0

## 2021-11-01 NOTE — Patient Instructions (Signed)
You were seen today in the [facility] for reevaluation of HTN.  You were prescribed medications, please take as directed. Please follow up in 3 mths for reevaluation of HTN. 

## 2021-11-01 NOTE — Progress Notes (Signed)
[address], [place]  [address] Phone:  [phone]   Fax:  [phone] Subjective:   Patient ID: Heidi Tran, female    DOB: 1960-07-30, 61 y.o.   MRN: [id number]  Chief Complaint  Patient presents with  . Dental Pain    Pt is here for BP follow up. Pt stated she has tooth pain for a month.   HPI Heidi Tran 61 y.o. female  has a past medical history of Allergic rhinitis, cause unspecified, Asthma, Hypercholesteremia (12/2019), Hypertension, Shoulder strain, left, initial encounter (04/2019), Unspecified essential hypertension, and Vitamin D deficiency. To the [facility] for reevaluation of HTN.  Hypertension: Patient here for follow-up of elevated blood pressure. She is exercising and is adherent to low salt diet.  Blood pressure {is/is not:9024} well controlled at home. Cardiac symptoms {Symptoms; cardiac:12860}. Patient denies {Symptoms; cardiac:12860}.  Cardiovascular risk factors: {cv risk factors:510}. Use of agents associated with hypertension: {bp agents assoc with hypertension:511::"none"}. History of target organ damage: {target organ:6710249165}. Checks B/P at home, typically similar to today's values.  Past Medical History:  Diagnosis Date  . Allergic rhinitis, cause unspecified   . Asthma   . Hypercholesteremia 12/2019  . Hypertension   . Shoulder strain, left, initial encounter 04/2019  . Unspecified essential hypertension   . Vitamin D deficiency     Past Surgical History:  Procedure Laterality Date  . TOTAL ABDOMINAL HYSTERECTOMY W/ BILATERAL SALPINGOOPHORECTOMY      Family History  Problem Relation Age of Onset  . Cancer Mother   . Prostate cancer Father   . Heart disease Father   . Diabetes Father     Social History   Socioeconomic History  . Marital status: Single    Spouse name: Not on file  . Number of children: Not on file  . Years of education: Not on file  . Highest education level: Not on file   Occupational History  . Occupation: Network engineer  Tobacco Use  . Smoking status: Never  . Smokeless tobacco: Never  Vaping Use  . Vaping Use: Never used  Substance and Sexual Activity  . Alcohol use: Yes    Comment: very rare  . Drug use: Never  . Sexual activity: Not Currently  Other Topics Concern  . Not on file  Social History Narrative  . Not on file   Social Determinants of Health   Financial Resource Strain: Not on file  Food Insecurity: Not on file  Transportation Needs: Not on file  Physical Activity: Not on file  Stress: Not on file  Social Connections: Not on file  Intimate Partner Violence: Not on file    Outpatient Medications Prior to Visit  Medication Sig Dispense Refill  . albuterol (VENTOLIN HFA) 108 (90 Base) MCG/ACT inhaler Inhale 2 puffs into the lungs every 6 (six) hours as needed for wheezing or shortness of breath. 18 g 12  . fluticasone-salmeterol (ADVAIR DISKUS) 100-50 MCG/ACT AEPB Inhale 1 puff into the lungs 2 (two) times daily. 60 each 2  . hydrocortisone cream 1 % Apply 1 application topically 2 (two) times daily. 30 g 3  . Multiple Vitamin (MULTIVITAMIN) capsule Take 1 capsule by mouth daily. 30 capsule 6  . naproxen (NAPROSYN) 500 MG tablet Take 1 tablet (500 mg total) by mouth 2 (two) times daily with a meal. 180 tablet 1  . triamcinolone (KENALOG) 0.1 % Apply 1 application topically 2 (two) times daily. 30 g 3  . triamterene-hydrochlorothiazide (MAXZIDE-25) 37.5-25  MG tablet Take 1 tablet by mouth daily. 90 tablet 1  . albuterol (PROVENTIL) (2.5 MG/3ML) 0.083% nebulizer solution TAKE 3 MLS (2.5 MG TOTAL) BY NEBULIZATION EVERY 6 (SIX) HOURS AS NEEDED. 360 mL 3  . fish oil-omega-3 fatty acids 1000 MG capsule Take 1 g by mouth daily. (Patient not taking: Reported on 08/01/2021)    . potassium chloride SA (KLOR-CON) 20 MEQ tablet Take 1 tablet (20 mEq total) by mouth daily. (Patient not taking: Reported on 08/01/2021) 90 tablet 1  . Vitamin D,  Ergocalciferol, (DRISDOL) 1.25 MG (50000 UT) CAPS capsule Take 1 capsule (50,000 Units total) by mouth every 7 (seven) days. (Patient not taking: Reported on 08/01/2021) 30 capsule 0   No facility-administered medications prior to visit.    Allergies  Allergen Reactions  . Statins Other (See Comments)    Leg cramps  . Shellfish Allergy Swelling    Throat, lips and tongue swelling  . Sulfonamide Derivatives     REACTION: rash    ROS     Objective:    Physical Exam  BP (!) 155/89 (BP Location: Right Arm, Patient Position: Sitting)   Pulse 83   Temp 97.9 F (36.6 C)   Ht '5\' 1"'  (1.549 m)   Wt 173 lb 8 oz (78.7 kg)   BMI 32.78 kg/m  Wt Readings from Last 3 Encounters:  11/01/21 173 lb 8 oz (78.7 kg)  08/01/21 170 lb 0.4 oz (77.1 kg)  11/12/20 172 lb (78 kg)    Immunization History  Administered Date(s) Administered  . Influenza Split 02/17/2011, 03/12/2012, 01/27/2013, 02/26/2014  . Influenza Whole 02/26/2009, 02/26/2010  . Influenza,inj,Quad PF,6+ Mos 07/17/2017, 02/18/2018, 01/31/2019, 03/18/2020, 03/24/2021  . PFIZER(Purple Top)SARS-COV-2 Vaccination 08/19/2019, 09/16/2019, 04/26/2020  . Pneumococcal Polysaccharide-23 05/29/2006    Diabetic Foot Exam - Simple   No data filed     Lab Results  Component Value Date   TSH 0.513 08/01/2021   Lab Results  Component Value Date   WBC 5.8 01/21/2020   HGB 12.6 01/21/2020   HCT 38.4 01/21/2020   MCV 81 01/21/2020   PLT 300 01/21/2020   Lab Results  Component Value Date   NA 143 08/01/2021   K 4.1 08/01/2021   CO2 25 01/21/2020   GLUCOSE 78 08/01/2021   BUN 13 08/01/2021   CREATININE 0.85 08/01/2021   BILITOT 0.4 08/01/2021   ALKPHOS 115 08/01/2021   AST 11 08/01/2021   ALT 11 01/21/2020   PROT 7.4 08/01/2021   ALBUMIN 4.9 (H) 08/01/2021   CALCIUM 10.2 08/01/2021   ANIONGAP 13 09/30/2017   EGFR 77 08/01/2021   GFR 105.95 12/30/2010   Lab Results  Component Value Date   CHOL 328 (H) 08/01/2021    CHOL 328 (H) 01/21/2020   CHOL 289 (H) 01/31/2019   Lab Results  Component Value Date   HDL 55 08/01/2021   HDL 56 01/21/2020   HDL 52 01/31/2019   Lab Results  Component Value Date   LDLCALC 248 (H) 08/01/2021   LDLCALC 239 (H) 01/21/2020   LDLCALC 219 (H) 01/31/2019   Lab Results  Component Value Date   TRIG 137 08/01/2021   TRIG 169 (H) 01/21/2020   TRIG 103 01/31/2019   Lab Results  Component Value Date   CHOLHDL 6.0 (H) 08/01/2021   CHOLHDL 5.9 (H) 01/21/2020   CHOLHDL 5.6 (H) 01/31/2019   Lab Results  Component Value Date   HGBA1C 5.8 (A) 01/21/2020   HGBA1C 5.8 01/21/2020   HGBA1C  5.8 01/21/2020   HGBA1C 5.8 01/21/2020       Assessment & Plan:   Problem List Items Addressed This Visit   None   I am having Anquanette Hardage maintain her fish oil-omega-3 fatty acids, hydrocortisone cream, multivitamin, Vitamin D (Ergocalciferol), albuterol, naproxen, potassium chloride SA, triamcinolone cream, triamterene-hydrochlorothiazide, fluticasone-salmeterol, and albuterol.  No orders of the defined types were placed in this encounter.    Teena Dunk, NP

## 2021-11-07 ENCOUNTER — Other Ambulatory Visit: Payer: Self-pay

## 2021-11-30 ENCOUNTER — Other Ambulatory Visit: Payer: Self-pay

## 2021-12-02 ENCOUNTER — Other Ambulatory Visit: Payer: Self-pay

## 2022-01-02 ENCOUNTER — Other Ambulatory Visit: Payer: Self-pay

## 2022-01-04 ENCOUNTER — Other Ambulatory Visit: Payer: Self-pay

## 2022-01-09 ENCOUNTER — Other Ambulatory Visit: Payer: Self-pay

## 2022-01-11 ENCOUNTER — Other Ambulatory Visit: Payer: Self-pay

## 2022-01-12 ENCOUNTER — Other Ambulatory Visit: Payer: Self-pay | Admitting: Nurse Practitioner

## 2022-01-12 ENCOUNTER — Other Ambulatory Visit: Payer: Self-pay

## 2022-01-12 MED ORDER — FLUTICASONE FUROATE-VILANTEROL 100-25 MCG/ACT IN AEPB
1.0000 | INHALATION_SPRAY | Freq: Every day | RESPIRATORY_TRACT | 2 refills | Status: AC
  Filled 2022-01-12 – 2022-03-15 (×2): qty 60, 30d supply, fill #0

## 2022-02-01 ENCOUNTER — Other Ambulatory Visit: Payer: Self-pay

## 2022-02-01 ENCOUNTER — Ambulatory Visit (INDEPENDENT_AMBULATORY_CARE_PROVIDER_SITE_OTHER): Payer: Self-pay | Admitting: Nurse Practitioner

## 2022-02-01 ENCOUNTER — Encounter: Payer: Self-pay | Admitting: Nurse Practitioner

## 2022-02-01 VITALS — BP 152/95 | HR 87 | Temp 97.1°F | Ht 61.0 in | Wt 176.0 lb

## 2022-02-01 DIAGNOSIS — I10 Essential (primary) hypertension: Secondary | ICD-10-CM

## 2022-02-01 DIAGNOSIS — J0100 Acute maxillary sinusitis, unspecified: Secondary | ICD-10-CM

## 2022-02-01 MED ORDER — TRIAMTERENE-HCTZ 37.5-25 MG PO TABS
1.0000 | ORAL_TABLET | Freq: Every day | ORAL | 1 refills | Status: DC
  Filled 2022-02-01: qty 30, 30d supply, fill #0
  Filled 2022-03-14: qty 30, 30d supply, fill #1
  Filled 2022-05-03: qty 30, 30d supply, fill #2

## 2022-02-01 MED ORDER — AMOXICILLIN-POT CLAVULANATE 875-125 MG PO TABS
1.0000 | ORAL_TABLET | Freq: Two times a day (BID) | ORAL | 0 refills | Status: AC
  Filled 2022-02-01: qty 20, 10d supply, fill #0

## 2022-02-01 NOTE — Assessment & Plan Note (Signed)
-   CBC - Comprehensive metabolic panel - triamterene-hydrochlorothiazide (MAXZIDE-25) 37.5-25 MG tablet; Take 1 tablet by mouth daily.  Dispense: 90 tablet; Refill: 1  2. Acute non-recurrent maxillary sinusitis  - amoxicillin-clavulanate (AUGMENTIN) 875-125 MG tablet; Take 1 tablet by mouth 2 (two) times daily for 10 days.  Dispense: 20 tablet; Refill: 0  AVS was discussed with patient. Patient was asked if there were any other questions or concerns today. Patient stated there were no other questions or concerns. Patient was advised that they needed lab work and then could check out and schedule follow up visit. Patient voiced understanding. Appointment was completed.    Follow up:  Follow up in 3 months

## 2022-02-01 NOTE — Progress Notes (Signed)
@Patient  ID: , female    DOB: Oct 10, 1960, 61 y.o.   MRN: [id number]  Chief Complaint  Patient presents with   Follow-up    Pt is here for 3 months BP follow up visit. Pt states she has been congestion thick yellow mucus for about 2 weeks     Referring provider: [id number], NP   HPI  Heidi Tran 61 y.o. female  has a past medical history of Allergic rhinitis, cause unspecified, Asthma, Hypercholesteremia (12/2019), Hypertension, Shoulder strain, left, initial encounter (04/2019), Unspecified essential hypertension, and Vitamin D deficiency.  Hypertension:  Patient presents today for hypertension.  Patient states that she has stopped taking her amlodipine.  She states that it was making her feel weak and dizzy.  She states that she is still taking Maxide.  Her blood pressure was a little elevated in office today but she states that she took the medicine a few minutes before she came into my office.    Patient complains today of sinus congestion.  She states that she has been having sinus congestion with thick yellow sputum and sinus drainage for the past 2 weeks.  She states that her teeth hurt again as well.  She does have sinus pain and pressure.  Denies f/c/s, n/v/d, hemoptysis, PND, leg swelling Denies chest pain or edema    hypertension  Stopped taking amlodipine  Congestion x 2 weeks - sinus drainage is now thick and yellow.          Allergies  Allergen Reactions   Statins Other (See Comments)    Leg cramps   Shellfish Allergy Swelling    Throat, lips and tongue swelling   Sulfonamide Derivatives     REACTION: rash    Immunization History  Administered Date(s) Administered   Influenza Split 02/17/2011, 03/12/2012, 01/27/2013, 02/26/2014   Influenza Whole 02/26/2009, 02/26/2010   Influenza,inj,Quad PF,6+ Mos 07/17/2017, 02/18/2018, 01/31/2019, 03/18/2020, 03/24/2021   PFIZER(Purple Top)SARS-COV-2 Vaccination 08/19/2019, 09/16/2019,  04/26/2020   Pneumococcal Polysaccharide-23 05/29/2006    Past Medical History:  Diagnosis Date   Allergic rhinitis, cause unspecified    Asthma    Hypercholesteremia 12/2019   Hypertension    Shoulder strain, left, initial encounter 04/2019   Stroke [facility])    Unspecified essential hypertension    Vitamin D deficiency     Tobacco History: Social History   Tobacco Use  Smoking Status Never  Smokeless Tobacco Never   Counseling given: Not Answered   Outpatient Encounter Medications as of 02/01/2022  Medication Sig   albuterol (PROVENTIL) (2.5 MG/3ML) 0.083% nebulizer solution TAKE 3 MLS (2.5 MG TOTAL) BY NEBULIZATION EVERY 6 (SIX) HOURS AS NEEDED.   albuterol (VENTOLIN HFA) 108 (90 Base) MCG/ACT inhaler Inhale 2 puffs into the lungs every 6 (six) hours as needed for wheezing or shortness of breath.   amoxicillin-clavulanate (AUGMENTIN) 875-125 MG tablet Take 1 tablet by mouth 2 (two) times daily for 10 days.   fluticasone furoate-vilanterol (BREO ELLIPTA) 100-25 MCG/ACT AEPB Inhale 1 puff into the lungs daily.   hydrocortisone cream 1 % Apply 1 application topically 2 (two) times daily.   Multiple Vitamin (MULTIVITAMIN) capsule Take 1 capsule by mouth daily.   naproxen (NAPROSYN) 500 MG tablet Take 1 tablet (500 mg total) by mouth 2 (two) times daily with a meal.   triamcinolone (KENALOG) 0.1 % Apply 1 application topically 2 (two) times daily.   [DISCONTINUED] triamterene-hydrochlorothiazide (MAXZIDE-25) 37.5-25 MG tablet Take 1 tablet by mouth daily.   amLODipine (NORVASC) 10 MG  tablet Take 1 tablet (10 mg total) by mouth daily.   fish oil-omega-3 fatty acids 1000 MG capsule Take 1 g by mouth daily. (Patient not taking: Reported on 08/01/2021)   potassium chloride SA (KLOR-CON) 20 MEQ tablet Take 1 tablet (20 mEq total) by mouth daily. (Patient not taking: Reported on 08/01/2021)   triamterene-hydrochlorothiazide (MAXZIDE-25) 37.5-25 MG tablet Take 1 tablet by mouth daily.   Vitamin  D, Ergocalciferol, (DRISDOL) 1.25 MG (50000 UT) CAPS capsule Take 1 capsule (50,000 Units total) by mouth every 7 (seven) days. (Patient not taking: Reported on 08/01/2021)   No facility-administered encounter medications on file as of 02/01/2022.     Review of Systems  Review of Systems  Constitutional: Negative.   HENT:  Positive for congestion, postnasal drip, sinus pressure and sinus pain.   Cardiovascular: Negative.   Gastrointestinal: Negative.   Allergic/Immunologic: Negative.   Neurological: Negative.   Psychiatric/Behavioral: Negative.         Physical Exam  BP (!) 152/95 (BP Location: Left Arm, Patient Position: Sitting, Cuff Size: Large)   Pulse 87   Temp (!) 97.1 F (36.2 C)   Ht 5\' 1"  (1.549 m)   Wt 176 lb (79.8 kg)   SpO2 100%   BMI 33.25 kg/m   Wt Readings from Last 5 Encounters:  02/01/22 176 lb (79.8 kg)  11/01/21 173 lb 8 oz (78.7 kg)  08/01/21 170 lb 0.4 oz (77.1 kg)  11/12/20 172 lb (78 kg)  08/11/20 169 lb 12.8 oz (77 kg)     Physical Exam Vitals and nursing note reviewed.  Constitutional:      General: She is not in acute distress.    Appearance: She is well-developed.  Cardiovascular:     Rate and Rhythm: Normal rate and regular rhythm.  Pulmonary:     Effort: Pulmonary effort is normal.     Breath sounds: Normal breath sounds.  Neurological:     Mental Status: She is alert and oriented to person, place, and time.      Lab Results:  CBC    Component Value Date/Time   WBC 5.8 01/21/2020 1222   WBC 7.7 09/30/2017 1040   RBC 4.77 01/21/2020 1222   RBC 4.64 09/30/2017 1040   HGB 12.6 01/21/2020 1222   HCT 38.4 01/21/2020 1222   PLT 300 01/21/2020 1222   MCV 81 01/21/2020 1222   MCH 26.4 (L) 01/21/2020 1222   MCH 27.2 09/30/2017 1040   MCHC 32.8 01/21/2020 1222   MCHC 33.9 09/30/2017 1040   RDW 13.2 01/21/2020 1222   LYMPHSABS 2.1 01/21/2020 1222   MONOABS 0.5 12/30/2010 1009   EOSABS 0.1 01/21/2020 1222   BASOSABS 0.1  01/21/2020 1222    BMET    Component Value Date/Time   NA 143 08/01/2021 1215   K 4.1 08/01/2021 1215   CL 102 08/01/2021 1215   CO2 25 01/21/2020 1222   GLUCOSE 78 08/01/2021 1215   GLUCOSE 115 (H) 09/30/2017 1040   BUN 13 08/01/2021 1215   CREATININE 0.85 08/01/2021 1215   CALCIUM 10.2 08/01/2021 1215   GFRNONAA 80 01/21/2020 1222   GFRAA 92 01/21/2020 1222    BNP No results found for: "BNP"  ProBNP    Component Value Date/Time   PROBNP 22.0 07/13/2008 1103    Imaging: No results found.   Assessment & Plan:   Essential hypertension - CBC - Comprehensive metabolic panel - triamterene-hydrochlorothiazide (MAXZIDE-25) 37.5-25 MG tablet; Take 1 tablet by mouth daily.  Dispense:  90 tablet; Refill: 1  2. Acute non-recurrent maxillary sinusitis  - amoxicillin-clavulanate (AUGMENTIN) 875-125 MG tablet; Take 1 tablet by mouth 2 (two) times daily for 10 days.  Dispense: 20 tablet; Refill: 0  AVS was discussed with patient. Patient was asked if there were any other questions or concerns today. Patient stated there were no other questions or concerns. Patient was advised that they needed lab work and then could check out and schedule follow up visit. Patient voiced understanding. Appointment was completed.    Follow up:  Follow up in 3 months     Ivonne Andrew, NP 02/01/2022

## 2022-02-01 NOTE — Patient Instructions (Addendum)
1. Essential hypertension  - CBC - Comprehensive metabolic panel - triamterene-hydrochlorothiazide (MAXZIDE-25) 37.5-25 MG tablet; Take 1 tablet by mouth daily.  Dispense: 90 tablet; Refill: 1  2. Acute non-recurrent maxillary sinusitis  - amoxicillin-clavulanate (AUGMENTIN) 875-125 MG tablet; Take 1 tablet by mouth 2 (two) times daily for 10 days.  Dispense: 20 tablet; Refill: 0  AVS was discussed with patient. Patient was asked if there were any other questions or concerns today. Patient stated there were no other questions or concerns. Patient was advised that they needed lab work and then could check out and schedule follow up visit. Patient voiced understanding. Appointment was completed.    Follow up:  Follow up in 3 months

## 2022-02-02 LAB — COMPREHENSIVE METABOLIC PANEL
ALT: 10 IU/L (ref 0–32)
AST: 13 IU/L (ref 0–40)
Albumin/Globulin Ratio: 1.6 (ref 1.2–2.2)
Albumin: 4.6 g/dL (ref 3.9–4.9)
Alkaline Phosphatase: 106 IU/L (ref 44–121)
BUN/Creatinine Ratio: 13 (ref 12–28)
BUN: 12 mg/dL (ref 8–27)
Bilirubin Total: 0.3 mg/dL (ref 0.0–1.2)
CO2: 25 mmol/L (ref 20–29)
Calcium: 10.3 mg/dL (ref 8.7–10.3)
Chloride: 101 mmol/L (ref 96–106)
Creatinine, Ser: 0.9 mg/dL (ref 0.57–1.00)
Globulin, Total: 2.9 g/dL (ref 1.5–4.5)
Glucose: 95 mg/dL (ref 70–99)
Potassium: 4 mmol/L (ref 3.5–5.2)
Sodium: 141 mmol/L (ref 134–144)
Total Protein: 7.5 g/dL (ref 6.0–8.5)
eGFR: 72 mL/min/{1.73_m2} (ref >59)

## 2022-02-02 LAB — CBC
Hematocrit: 39.3 % (ref 34.0–46.6)
Hemoglobin: 12.9 g/dL (ref 11.1–15.9)
MCH: 26.6 pg (ref 26.6–33.0)
MCHC: 32.8 g/dL (ref 31.5–35.7)
MCV: 81 fL (ref 79–97)
Platelets: 311 10*3/uL (ref 150–450)
RBC: 4.85 x10E6/uL (ref 3.77–5.28)
RDW: 13.6 % (ref 11.7–15.4)
WBC: 7.9 10*3/uL (ref 3.4–10.8)

## 2022-03-06 ENCOUNTER — Other Ambulatory Visit: Payer: Self-pay

## 2022-03-14 ENCOUNTER — Other Ambulatory Visit: Payer: Self-pay

## 2022-03-15 ENCOUNTER — Other Ambulatory Visit: Payer: Self-pay

## 2022-05-03 ENCOUNTER — Other Ambulatory Visit: Payer: Self-pay

## 2022-05-04 ENCOUNTER — Encounter: Payer: Self-pay | Admitting: Nurse Practitioner

## 2022-05-04 ENCOUNTER — Other Ambulatory Visit: Payer: Self-pay

## 2022-05-04 ENCOUNTER — Ambulatory Visit (INDEPENDENT_AMBULATORY_CARE_PROVIDER_SITE_OTHER): Payer: Commercial Managed Care - HMO | Admitting: Nurse Practitioner

## 2022-05-04 VITALS — BP 146/89 | HR 84 | Temp 97.6°F | Ht 61.0 in | Wt 169.0 lb

## 2022-05-04 DIAGNOSIS — Z23 Encounter for immunization: Secondary | ICD-10-CM | POA: Diagnosis not present

## 2022-05-04 DIAGNOSIS — J4540 Moderate persistent asthma, uncomplicated: Secondary | ICD-10-CM

## 2022-05-04 DIAGNOSIS — I10 Essential (primary) hypertension: Secondary | ICD-10-CM | POA: Diagnosis not present

## 2022-05-04 MED ORDER — FLUTICASONE-SALMETEROL 250-50 MCG/ACT IN AEPB
1.0000 | INHALATION_SPRAY | Freq: Two times a day (BID) | RESPIRATORY_TRACT | 0 refills | Status: DC
  Filled 2022-05-04: qty 60, 30d supply, fill #0

## 2022-05-04 MED ORDER — MONTELUKAST SODIUM 10 MG PO TABS
10.0000 mg | ORAL_TABLET | Freq: Every day | ORAL | 3 refills | Status: DC
  Filled 2022-05-04: qty 30, 30d supply, fill #0
  Filled 2022-05-30: qty 30, 30d supply, fill #1
  Filled 2022-07-11 – 2022-07-17 (×3): qty 30, 30d supply, fill #2

## 2022-05-04 MED ORDER — TRIAMTERENE-HCTZ 37.5-25 MG PO TABS
1.0000 | ORAL_TABLET | Freq: Every day | ORAL | 1 refills | Status: DC
  Filled 2022-05-30: qty 30, 30d supply, fill #0
  Filled 2022-07-11 – 2022-07-17 (×3): qty 30, 30d supply, fill #1
  Filled 2022-08-14: qty 30, 30d supply, fill #2
  Filled 2022-09-13: qty 30, 30d supply, fill #3
  Filled 2022-10-16: qty 30, 30d supply, fill #4
  Filled 2022-11-13: qty 30, 30d supply, fill #5

## 2022-05-04 NOTE — Patient Instructions (Addendum)
1. Essential hypertension  - triamterene-hydrochlorothiazide (MAXZIDE-25) 37.5-25 MG tablet; Take 1 tablet by mouth daily.  Dispense: 90 tablet; Refill: 1  2. Moderate persistent asthma, unspecified whether complicated  - fluticasone-salmeterol (ADVAIR DISKUS) 250-50 MCG/ACT AEPB; Inhale 1 puff into the lungs in the morning and at bedtime.  Dispense: 14 each; Refill: 0   Follow up:  Follow up in 3 months or sooner if needed

## 2022-05-04 NOTE — Progress Notes (Signed)
@Patient  ID: , female    DOB: Jul 19, 1960, 61 y.o.   MRN: [id number]  Chief Complaint  Patient presents with   Asthma    Pt stated--rx Breo--is not working.    Referring provider: [id number], NP   HPI  Heidi Tran 61 y.o. female  has a past medical history of Allergic rhinitis, cause unspecified, Asthma, Hypercholesteremia (12/2019), Hypertension, Shoulder strain, left, initial encounter (04/2019), Unspecified essential hypertension, and Vitamin D deficiency.   Hypertension:   Patient presents today for hypertension.  Patient states that she has stopped taking her amlodipine.  She states that it was making her feel weak and dizzy.  She is compliant with taking Maxide.  Her blood pressure was a little elevated in office today.Overall she states that she is doing well.  Patient states that she has been taking Breo and it is not working for her.  She previously was on Advair but this was switched due to insurance.  She would like for her prescription of Advair to be called back into the pharmacy.  We will also start patient on Singulair at night since she is having more difficulty with her asthma since switching to [facility].  Hopefully we can get the Advair approved.  Denies f/c/s, n/v/d, hemoptysis, PND, leg swelling Denies chest pain or edema           Allergies  Allergen Reactions   Statins Other (See Comments)    Leg cramps   Shellfish Allergy Swelling    Throat, lips and tongue swelling   Sulfonamide Derivatives     REACTION: rash    Immunization History  Administered Date(s) Administered   Influenza Split 02/17/2011, 03/12/2012, 01/27/2013, 02/26/2014   Influenza Whole 02/26/2009, 02/26/2010   Influenza,inj,Quad PF,6+ Mos 07/17/2017, 02/18/2018, 01/31/2019, 03/18/2020, 03/24/2021, 05/04/2022   PFIZER(Purple Top)SARS-COV-2 Vaccination 08/19/2019, 09/16/2019, 04/26/2020   Pneumococcal Polysaccharide-23 05/29/2006    Past Medical History:  Diagnosis  Date   Allergic rhinitis, cause unspecified    Asthma    Hypercholesteremia 12/2019   Hypertension    Shoulder strain, left, initial encounter 04/2019   Stroke [facility]    Unspecified essential hypertension    Vitamin D deficiency     Tobacco History: Social History   Tobacco Use  Smoking Status Never  Smokeless Tobacco Never   Counseling given: Not Answered   Outpatient Encounter Medications as of 05/04/2022  Medication Sig   albuterol (PROVENTIL) (2.5 MG/3ML) 0.083% nebulizer solution TAKE 3 MLS (2.5 MG TOTAL) BY NEBULIZATION EVERY 6 (SIX) HOURS AS NEEDED.   albuterol (VENTOLIN HFA) 108 (90 Base) MCG/ACT inhaler Inhale 2 puffs into the lungs every 6 (six) hours as needed for wheezing or shortness of breath.   fish oil-omega-3 fatty acids 1000 MG capsule Take 1 g by mouth daily.   hydrocortisone cream 1 % Apply 1 application topically 2 (two) times daily.   montelukast (SINGULAIR) 10 MG tablet Take 1 tablet (10 mg total) by mouth at bedtime.   Multiple Vitamin (MULTIVITAMIN) capsule Take 1 capsule by mouth daily.   naproxen (NAPROSYN) 500 MG tablet Take 1 tablet (500 mg total) by mouth 2 (two) times daily with a meal.   triamcinolone (KENALOG) 0.1 % Apply 1 application topically 2 (two) times daily.   [DISCONTINUED] fluticasone-salmeterol (ADVAIR DISKUS) 250-50 MCG/ACT AEPB Inhale 1 puff into the lungs in the morning and at bedtime.   [DISCONTINUED] triamterene-hydrochlorothiazide (MAXZIDE-25) 37.5-25 MG tablet Take 1 tablet by mouth daily.   fluticasone-salmeterol (ADVAIR DISKUS) 250-50 MCG/ACT AEPB  Inhale 1 puff into the lungs in the morning and at bedtime.   potassium chloride SA (KLOR-CON) 20 MEQ tablet Take 1 tablet (20 mEq total) by mouth daily. (Patient not taking: Reported on 08/01/2021)   triamterene-hydrochlorothiazide (MAXZIDE-25) 37.5-25 MG tablet Take 1 tablet by mouth daily.   [DISCONTINUED] amLODipine (NORVASC) 10 MG tablet Take 1 tablet (10 mg total) by mouth daily.    [DISCONTINUED] Vitamin D, Ergocalciferol, (DRISDOL) 1.25 MG (50000 UT) CAPS capsule Take 1 capsule (50,000 Units total) by mouth every 7 (seven) days. (Patient not taking: Reported on 08/01/2021)   No facility-administered encounter medications on file as of 05/04/2022.     Review of Systems  Review of Systems  Constitutional: Negative.   HENT: Negative.    Cardiovascular: Negative.   Gastrointestinal: Negative.   Allergic/Immunologic: Negative.   Neurological: Negative.   Psychiatric/Behavioral: Negative.         Physical Exam  BP (!) 146/89   Pulse 84   Temp 97.6 F (36.4 C)   Ht 5\' 1"  (1.549 m)   Wt 169 lb (76.7 kg)   SpO2 96%   BMI 31.93 kg/m   Wt Readings from Last 5 Encounters:  05/04/22 169 lb (76.7 kg)  02/01/22 176 lb (79.8 kg)  11/01/21 173 lb 8 oz (78.7 kg)  08/01/21 170 lb 0.4 oz (77.1 kg)  11/12/20 172 lb (78 kg)     Physical Exam Vitals and nursing note reviewed.  Constitutional:      General: She is not in acute distress.    Appearance: She is well-developed.  Cardiovascular:     Rate and Rhythm: Normal rate and regular rhythm.  Pulmonary:     Effort: Pulmonary effort is normal.     Breath sounds: Normal breath sounds.  Neurological:     Mental Status: She is alert and oriented to person, place, and time.      Lab Results:  CBC    Component Value Date/Time   WBC 7.9 02/01/2022 1203   WBC 7.7 09/30/2017 1040   RBC 4.85 02/01/2022 1203   RBC 4.64 09/30/2017 1040   HGB 12.9 02/01/2022 1203   HCT 39.3 02/01/2022 1203   PLT 311 02/01/2022 1203   MCV 81 02/01/2022 1203   MCH 26.6 02/01/2022 1203   MCH 27.2 09/30/2017 1040   MCHC 32.8 02/01/2022 1203   MCHC 33.9 09/30/2017 1040   RDW 13.6 02/01/2022 1203   LYMPHSABS 2.1 01/21/2020 1222   MONOABS 0.5 12/30/2010 1009   EOSABS 0.1 01/21/2020 1222   BASOSABS 0.1 01/21/2020 1222    BMET    Component Value Date/Time   NA 141 02/01/2022 1203   K 4.0 02/01/2022 1203   CL 101  02/01/2022 1203   CO2 25 02/01/2022 1203   GLUCOSE 95 02/01/2022 1203   GLUCOSE 115 (H) 09/30/2017 1040   BUN 12 02/01/2022 1203   CREATININE 0.90 02/01/2022 1203   CALCIUM 10.3 02/01/2022 1203   GFRNONAA 80 01/21/2020 1222   GFRAA 92 01/21/2020 1222    BNP No results found for: "BNP"  ProBNP    Component Value Date/Time   PROBNP 22.0 07/13/2008 1103      Assessment & Plan:   Essential hypertension - triamterene-hydrochlorothiazide (MAXZIDE-25) 37.5-25 MG tablet; Take 1 tablet by mouth daily.  Dispense: 90 tablet; Refill: 1  2. Moderate persistent asthma, unspecified whether complicated  - fluticasone-salmeterol (ADVAIR DISKUS) 250-50 MCG/ACT AEPB; Inhale 1 puff into the lungs in the morning and at bedtime.  Dispense:  14 each; Refill: 0   Follow up:  Follow up in 3 months or sooner if needed     Ivonne Andrew, NP 05/04/2022

## 2022-05-04 NOTE — Assessment & Plan Note (Signed)
-   triamterene-hydrochlorothiazide (MAXZIDE-25) 37.5-25 MG tablet; Take 1 tablet by mouth daily.  Dispense: 90 tablet; Refill: 1  2. Moderate persistent asthma, unspecified whether complicated  - fluticasone-salmeterol (ADVAIR DISKUS) 250-50 MCG/ACT AEPB; Inhale 1 puff into the lungs in the morning and at bedtime.  Dispense: 14 each; Refill: 0   Follow up:  Follow up in 3 months or sooner if needed

## 2022-05-16 ENCOUNTER — Other Ambulatory Visit: Payer: Self-pay
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
## 2022-05-30 ENCOUNTER — Other Ambulatory Visit: Payer: Self-pay

## 2022-05-30 ENCOUNTER — Other Ambulatory Visit: Payer: Self-pay | Admitting: Nurse Practitioner

## 2022-05-30 ENCOUNTER — Other Ambulatory Visit (HOSPITAL_BASED_OUTPATIENT_CLINIC_OR_DEPARTMENT_OTHER): Payer: Self-pay

## 2022-05-30 DIAGNOSIS — J4540 Moderate persistent asthma, uncomplicated: Secondary | ICD-10-CM

## 2022-06-02 ENCOUNTER — Other Ambulatory Visit: Payer: Self-pay

## 2022-06-15 DIAGNOSIS — J4540 Moderate persistent asthma, uncomplicated: Secondary | ICD-10-CM

## 2022-06-15 DIAGNOSIS — Z1231 Encounter for screening mammogram for malignant neoplasm of breast: Secondary | ICD-10-CM

## 2022-06-15 DIAGNOSIS — J45909 Unspecified asthma, uncomplicated: Secondary | ICD-10-CM

## 2022-06-19 ENCOUNTER — Other Ambulatory Visit: Payer: Self-pay

## 2022-06-19 MED ORDER — FLUTICASONE-SALMETEROL 250-50 MCG/ACT IN AEPB
1.0000 | INHALATION_SPRAY | Freq: Two times a day (BID) | RESPIRATORY_TRACT | 0 refills | Status: DC
  Filled 2022-06-19: qty 60, 30d supply, fill #0

## 2022-06-19 MED ORDER — ALBUTEROL SULFATE HFA 108 (90 BASE) MCG/ACT IN AERS
2.0000 | INHALATION_SPRAY | Freq: Four times a day (QID) | RESPIRATORY_TRACT | 12 refills | Status: DC | PRN
  Filled 2022-06-19: qty 18, 25d supply, fill #0
  Filled 2023-01-21: qty 6.7, 25d supply, fill #0

## 2022-06-19 NOTE — Addendum Note (Signed)
Addended by: Angus Seller on: 06/19/2022 11:49 AM   Modules accepted: Orders

## 2022-06-28 ENCOUNTER — Other Ambulatory Visit: Payer: Self-pay

## 2022-06-28 DIAGNOSIS — N644 Mastodynia: Secondary | ICD-10-CM

## 2022-06-30 ENCOUNTER — Other Ambulatory Visit: Payer: Self-pay | Admitting: Nurse Practitioner

## 2022-06-30 DIAGNOSIS — N644 Mastodynia: Secondary | ICD-10-CM

## 2022-07-11 ENCOUNTER — Other Ambulatory Visit: Payer: Self-pay

## 2022-07-12 ENCOUNTER — Other Ambulatory Visit (HOSPITAL_COMMUNITY): Payer: Self-pay

## 2022-07-13 ENCOUNTER — Other Ambulatory Visit: Payer: Self-pay

## 2022-07-14 ENCOUNTER — Other Ambulatory Visit: Payer: Self-pay | Admitting: Nurse Practitioner

## 2022-07-14 ENCOUNTER — Ambulatory Visit
Admission: RE | Admit: 2022-07-14 | Discharge: 2022-07-14 | Disposition: A | Discharge status: Home / Self Care | Payer: Commercial Managed Care - HMO | Source: Ambulatory Visit | Attending: Nurse Practitioner | Admitting: Nurse Practitioner

## 2022-07-14 DIAGNOSIS — N644 Mastodynia: Secondary | ICD-10-CM

## 2022-07-14 DIAGNOSIS — N6320 Unspecified lump in the left breast, unspecified quadrant: Secondary | ICD-10-CM

## 2022-07-17 ENCOUNTER — Other Ambulatory Visit: Payer: Self-pay

## 2022-07-18 ENCOUNTER — Ambulatory Visit
Admission: RE | Admit: 2022-07-18 | Discharge: 2022-07-18 | Disposition: A | Discharge status: Home / Self Care | Payer: Commercial Managed Care - HMO | Source: Ambulatory Visit | Attending: Nurse Practitioner | Admitting: Nurse Practitioner

## 2022-07-18 ENCOUNTER — Other Ambulatory Visit: Payer: Self-pay

## 2022-07-18 DIAGNOSIS — N6320 Unspecified lump in the left breast, unspecified quadrant: Secondary | ICD-10-CM

## 2022-07-18 HISTORY — PX: BREAST BIOPSY: SHX20

## 2022-07-20 ENCOUNTER — Telehealth: Payer: Self-pay | Admitting: Hematology

## 2022-07-20 NOTE — Telephone Encounter (Signed)
Spoke to patient to confirm upcoming afternoon [facility] clinic appointment on 2/28, paperwork will be sent via e-mail.   Gave location and time, also informed patient that the surgeon's office would be calling as well to get information from them similar to the packet that they will be receiving so make sure to do both.  Reminded patient that all providers will be coming to the clinic to see them HERE and if they had any questions to not hesitate to reach back out to myself or their navigators.

## 2022-07-24 ENCOUNTER — Encounter: Payer: Self-pay | Admitting: *Deleted

## 2022-07-24 DIAGNOSIS — D0512 Intraductal carcinoma in situ of left breast: Secondary | ICD-10-CM | POA: Insufficient documentation

## 2022-07-25 ENCOUNTER — Encounter: Payer: Self-pay | Admitting: Pharmacist

## 2022-07-25 NOTE — Assessment & Plan Note (Signed)
**-  year-old **menopausal woman, presented with screening discovered to DCIS.  1. Left breast DCIS, grade 2, ER+ /PR+ -I discussed her breast imaging and needle biopsy results with patient and her family members in great detail. -She is a candidate for breast conservation surgery. She has been seen by breast surgeon Dr. **, who recommends lumpectomy. -Given her strong family history of breast cancer, we recommend her to undergo genetic testing to ruled out inheritable breast cancer -Her DCIS will be cured by complete surgical resection. Any form of adjuvant therapy is preventive. -Given her strongly positive/negative ER and PR, I do/not recommend antiestrogen therapy with Tamoxifen, which decrease her risk of future breast cancer by ~40%.  -She will likely benefit from breast radiation if she undergo lumpectomy to decrease the risk of breast cancer. -We also discussed that biopsy may have sampling limitation, we will review her surgical path, to see if she has any invasive carcinoma components. -We discussed breast cancer surveillance after she completes treatment, Including annual mammogram, breast exam every 6-12 months.  Plan -She will likely have lumpectomy soon -Genetic refer -I'll see her 2-3 weeks after her surgery to review her pathology result and finalize her antiestrogen therapy.

## 2022-07-25 NOTE — Progress Notes (Signed)
Radiation Oncology         [phone] ________________________________  Multidisciplinary Breast Oncology Clinic [facility]) Initial Outpatient Consultation  Name: Heidi Tran MRN: [id number]  Date: 07/26/2022  DOB: Jun 19, 1960  [id number], Kriste Basque, NP  Erroll Luna, MD   REFERRING PHYSICIAN: Erroll Luna, MD  DIAGNOSIS: There were no encounter diagnoses.  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  HISTORY OF PRESENT ILLNESS::Heidi Tran is a 62 y.o. female who is presenting to the office today for evaluation of her newly diagnosed breast cancer. She is accompanied by ***. She is doing well overall.   The patient presented with outer left breast pain associated with a left breast lump, and a history of clear left nipple discharge.Her symptoms prompted a bilateral diagnostic mammogram with tomography and left breast ultrasound at [facility] on 07/14/22 showing: an irregular hypoechoic non-mass area within the the 3 o'clock left breast, 2 cmfn,measuring 3.5 x 2.3 x 2.3 cm, likely corresponding to multiple abutting masses with associated coarse heterogeneous calcifications seen mammographically. Imaging also revealed an additional oval circumscribed hypoechoic mass within the 3 o'clock left breast, 1 cmfn, measuring 9 x 6 x 6 mm, with adjacent dilated ducts, likely corresponding to a partially obscured mass closer to the nipple which was seen on the mammogram portion of this exam. No abnormal left axillary lymph nodes were appreciated sonographically.   Biopsies of the two 3 o'clock left breast masses located 2 cmfn and 1 cmfn on 07/18/22 showed: intermediate to high-grade DCIS measuring 0.7 cm and 0.5 cm in the greatest linear extent of the sample (respectively). Both involving a papillar lesion. Prognostic indicators significant for: estrogen receptor, 100% positive and  progesterone receptor, 95% positive, both with strong staining intensity. HER2 not assessed.  Menarche: *** years old Age at first live birth: *** years old GP: *** LMP: *** Contraceptive: *** HRT: ***   The patient was referred today for presentation in the multidisciplinary conference.  Radiology studies and pathology slides were presented there for review and discussion of treatment options.  A consensus was discussed regarding potential next steps.  PREVIOUS RADIATION THERAPY: {EXAM; YES/NO:19492::"No"}  PAST MEDICAL HISTORY:  Past Medical History:  Diagnosis Date   Allergic rhinitis, cause unspecified    Asthma    Hypercholesteremia 12/2019   Hypertension    Shoulder strain, left, initial encounter 04/2019   Stroke [facility])    Unspecified essential hypertension    Vitamin D deficiency     PAST SURGICAL HISTORY: Past Surgical History:  Procedure Laterality Date   BREAST BIOPSY Left 07/18/2022   [place] LT BREAST BX W LOC DEV 1ST LESION IMG BX SPEC US GUIDE 07/18/2022 [facility]AMMOGRAP[facility]   BREAST BIOPSY Left 07/18/2022   [place] LT BREAST BX W LOC DEV EA ADD LESION IMG BX SPEC US GUIDE 07/18/2022 [facility]   TOTAL ABDOMINAL HYSTERECTOMY W/ BILATERAL SALPINGOOPHORECTOMY      FAMILY HISTORY:  Family History  Problem Relation Age of Onset   Breast cancer Mother    Cancer Mother    Prostate cancer Father    Heart disease Father    Diabetes Father     SOCIAL HISTORY:  Social History   Socioeconomic History   Marital status: Single    Spouse name: Not on file   Number of children: Not on file   Years of education: Not on file   Highest  education level: Not on file  Occupational History   Occupation: Network engineer  Tobacco Use   Smoking status: Never   Smokeless tobacco: Never  Vaping Use   Vaping Use: Never used  Substance and Sexual Activity   Alcohol use: Yes    Comment: very rare   Drug use: Never   Sexual activity: Not Currently  Other Topics Concern   Not on  file  Social History Narrative   Not on file   Social Determinants of Health   Financial Resource Strain: Not on file  Food Insecurity: Not on file  Transportation Needs: Not on file  Physical Activity: Not on file  Stress: Not on file  Social Connections: Not on file    ALLERGIES:  Allergies  Allergen Reactions   Statins Other (See Comments)    Leg cramps   Shellfish Allergy Swelling    Throat, lips and tongue swelling   Sulfonamide Derivatives     REACTION: rash    MEDICATIONS:  Current Outpatient Medications  Medication Sig Dispense Refill   albuterol (PROVENTIL) (2.5 MG/3ML) 0.083% nebulizer solution TAKE 3 MLS (2.5 MG TOTAL) BY NEBULIZATION EVERY 6 (SIX) HOURS AS NEEDED. 360 mL 3   albuterol (VENTOLIN HFA) 108 (90 Base) MCG/ACT inhaler Inhale 2 puffs into the lungs every 6 (six) hours as needed for wheezing or shortness of breath. 18 g 12   fish oil-omega-3 fatty acids 1000 MG capsule Take 1 g by mouth daily.     fluticasone-salmeterol (ADVAIR DISKUS) 250-50 MCG/ACT AEPB Inhale 1 puff into the lungs in the morning and at bedtime. 60 each 0   hydrocortisone cream 1 % Apply 1 application topically 2 (two) times daily. 30 g 3   montelukast (SINGULAIR) 10 MG tablet Take 1 tablet (10 mg total) by mouth at bedtime. 30 tablet 3   Multiple Vitamin (MULTIVITAMIN) capsule Take 1 capsule by mouth daily. 30 capsule 6   naproxen (NAPROSYN) 500 MG tablet Take 1 tablet (500 mg total) by mouth 2 (two) times daily with a meal. 180 tablet 1   potassium chloride SA (KLOR-CON) 20 MEQ tablet Take 1 tablet (20 mEq total) by mouth daily. (Patient not taking: Reported on 08/01/2021) 90 tablet 1   triamcinolone (KENALOG) 0.1 % Apply 1 application topically 2 (two) times daily. 30 g 3   triamterene-hydrochlorothiazide (MAXZIDE-25) 37.5-25 MG tablet Take 1 tablet by mouth daily. 90 tablet 1   No current facility-administered medications for this encounter.    REVIEW OF SYSTEMS: A 10+ POINT REVIEW  OF SYSTEMS WAS OBTAINED including neurology, dermatology, psychiatry, cardiac, respiratory, lymph, extremities, GI, GU, musculoskeletal, constitutional, reproductive, HEENT. On the provided form, she reports ***. She denies *** and any other symptoms.    PHYSICAL EXAM:  vitals were not taken for this visit.  {may need to copy over vitals} Lungs are clear to auscultation bilaterally. Heart has regular rate and rhythm. No palpable cervical, supraclavicular, or axillary adenopathy. Abdomen soft, non-tender, normal bowel sounds. Breast: *** breast with no palpable mass, nipple discharge, or bleeding. *** breast with ***.   KPS = ***  100 - Normal; no complaints; no evidence of disease. 90   - Able to carry on normal activity; minor signs or symptoms of disease. 80   - Normal activity with effort; some signs or symptoms of disease. 43   - Cares for self; unable to carry on normal activity or to do active work. 60   - Requires occasional assistance, but is able to care  for most of his personal needs. 50   - Requires considerable assistance and frequent medical care. 65   - Disabled; requires special care and assistance. 66   - Severely disabled; hospital admission is indicated although death not imminent. 77   - Very sick; hospital admission necessary; active supportive treatment necessary. 10   - Moribund; fatal processes progressing rapidly. 0     - Dead  Karnofsky DA, Abelmann [place], Craver LS and Burchenal [facility] [phone]) The use of the nitrogen mustards in the palliative treatment of carcinoma: with particular reference to bronchogenic carcinoma Cancer 1 [id number]  LABORATORY DATA:  Lab Results  Component Value Date   WBC 7.9 02/01/2022   HGB 12.9 02/01/2022   HCT 39.3 02/01/2022   MCV 81 02/01/2022   PLT 311 02/01/2022   Lab Results  Component Value Date   NA 141 02/01/2022   K 4.0 02/01/2022   CL 101 02/01/2022   CO2 25 02/01/2022   Lab Results  Component Value Date   ALT 10 02/01/2022    AST 13 02/01/2022   ALKPHOS 106 02/01/2022   BILITOT 0.3 02/01/2022    PULMONARY FUNCTION TEST:   Review Flowsheet        No data to display          RADIOGRAPHY: [place] LT BREAST BX W LOC DEV 1ST LESION IMG BX SPEC US GUIDE  Addendum Date: 07/19/2022   ADDENDUM REPORT: 07/19/2022 17:07 ADDENDUM: Pathology revealed DUCTAL CARCINOMA IN SITU, 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 This was found to be concordant by Dr. Ammie Ferrier. 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 This was found to be concordant by Dr. Ammie Ferrier. Pathology results were discussed with the patient by telephone. The patient reported doing well after the biopsies with tenderness at the sites. Post biopsy instructions and care were reviewed and questions were answered. The patient was encouraged to call [facility] for any additional concerns. My direct phone number was provided. The patient was referred to [facility] on July 26, 2022. Consideration for a bilateral breast MRI for further evaluation of extent of disease given the fragmented appearance of the masses. Pathology results reported by Terie Purser, RN on 07/19/2022. Electronically Signed   By: Ammie Ferrier M.D.   On: 07/19/2022 17:07   Result Date: 07/19/2022 CLINICAL DATA:  62 year old female presenting for ultrasound-guided biopsy of 2 sites in the left breast. EXAM: ULTRASOUND GUIDED LEFT BREAST CORE NEEDLE BIOPSY COMPARISON:  Previous exam(s). PROCEDURE: I met with the patient and we discussed the procedure of ultrasound-guided biopsy, including benefits and alternatives. We discussed the high likelihood of a successful procedure. We discussed  the risks of the procedure, including infection, bleeding, tissue injury, clip migration, and inadequate sampling. Informed written consent was given. The usual time-out protocol was performed immediately prior to the procedure. Lesion quadrant: Upper outer quadrant Using ChloraPrep, sterile technique and 1% Lidocaine as deep local anesthetic, under direct ultrasound visualization, a 14 gauge spring-loaded device was used to perform biopsy of a mass in the left breast at 3 o'clock, 2 cm from the nipple using an inferior approach. At the conclusion of the procedure a ribbon shaped tissue marker clip was deployed into the biopsy cavity.  Lesion quadrant: Upper outer quadrant Using sterile technique and 1% Lidocaine as deep local anesthetic, under direct ultrasound visualization, a 14 gauge spring-loaded device was used to perform biopsy of a mass in the left breast at 3 o'clock, 1 cm from the nipple using a lateral approach. At the conclusion of the procedure a coil shaped tissue marker clip was deployed into the biopsy cavity. Follow up 2 view mammogram was performed and dictated separately. IMPRESSION: Ultrasound guided biopsy of 2 sites in the left breast at 3 o'clock. No apparent complications. Electronically Signed: By: Ammie Ferrier M.D. 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 This was found to be concordant by Dr. Ammie Ferrier. 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 This was found to be concordant by Dr. Ammie Ferrier. Pathology results were discussed with the patient by telephone. The patient  reported doing well after the biopsies with tenderness at the sites. Post biopsy instructions and care were reviewed and questions were answered. The patient was encouraged to call [facility] for any additional concerns. My direct phone number was provided. The patient was referred to [facility] on July 26, 2022. Consideration for a bilateral breast MRI for further evaluation of extent of disease given the fragmented appearance of the masses. Pathology results reported by Terie Purser, RN on 07/19/2022. Electronically Signed   By: Ammie Ferrier M.D.   On: 07/19/2022 17:07   Result Date: 07/19/2022 CLINICAL DATA:  62 year old female presenting for ultrasound-guided biopsy of 2 sites in the left breast. EXAM: ULTRASOUND GUIDED LEFT BREAST CORE NEEDLE BIOPSY COMPARISON:  Previous exam(s). PROCEDURE: I met with the patient and we discussed the procedure of ultrasound-guided biopsy, including benefits and alternatives. We discussed the high likelihood of a successful procedure. We discussed the risks of the procedure, including infection, bleeding, tissue injury, clip migration, and inadequate sampling. Informed written consent was given. The usual time-out protocol was performed immediately prior to the procedure. Lesion quadrant: Upper outer quadrant Using ChloraPrep, sterile technique and 1% Lidocaine as deep local anesthetic, under direct ultrasound visualization, a 14 gauge spring-loaded device was used to perform biopsy of a mass in the left breast at 3 o'clock, 2 cm from the nipple using an inferior approach. At the conclusion of the procedure a ribbon shaped tissue marker clip was deployed into the biopsy cavity. Lesion quadrant: Upper outer quadrant Using sterile technique and 1% Lidocaine as deep local anesthetic, under direct ultrasound visualization, a 14 gauge spring-loaded device was used to  perform biopsy of a mass in the left breast at 3 o'clock, 1 cm from the nipple using a lateral approach. At the conclusion of the procedure a coil shaped tissue marker clip was deployed into the biopsy cavity. Follow up 2 view mammogram was performed and dictated separately. IMPRESSION: Ultrasound guided biopsy of 2 sites in the left breast at 3 o'clock. No apparent complications. Electronically Signed: By: Ammie Ferrier M.D. On: 07/18/2022 14:58  MM CLIP PLACEMENT LEFT  Result Date: 07/18/2022 CLINICAL DATA:  Post biopsy mammogram of  the left breast for clip placement. EXAM: 3D DIAGNOSTIC LEFT MAMMOGRAM POST ULTRASOUND BIOPSY COMPARISON:  Previous exam(s). FINDINGS: 3D Mammographic images were obtained following ultrasound guided biopsy of 2 masses in the left breast. The biopsy marking clips are in expected position at the sites of biopsy. IMPRESSION: 1. Appropriate positioning of the ribbon shaped biopsy marking clip at the site of biopsy in the lateral posterior left breast. 2. Appropriate positioning of the coil shaped biopsy marking clip in the lateral anterior left breast. Final Assessment: Post Procedure Mammograms for Marker Placement Electronically Signed   By: Ammie Ferrier M.D.   On: 07/18/2022 16:03  MM DIAG BREAST TOMO BILATERAL  Addendum Date: 07/14/2022   ADDENDUM REPORT: 07/14/2022 14:44 ADDENDUM: The irregular hypoechoic non-mass area within the LEFT breast at the 3 o'clock axis, presumed correlate for the multiple abutting masses with associated coarse heterogeneous calcifications seen on mammogram in the outer LEFT breast at posterior depth, is labeled as 2 cm from the nipple on the ultrasound images. However, depending on patient positioning/rolling, this finding can be localized up to 4 cm from the nipple. Electronically Signed   By: Franki Cabot M.D.   On: 07/14/2022 14:44   Result Date: 07/14/2022 CLINICAL DATA:  Patient describes pain within the outer LEFT breast and an  associated lump. Patient also has had clear nipple discharge in the past. EXAM: DIGITAL DIAGNOSTIC BILATERAL MAMMOGRAM WITH TOMOSYNTHESIS; ULTRASOUND LEFT BREAST LIMITED TECHNIQUE: Bilateral digital diagnostic mammography and breast tomosynthesis was performed.; Targeted ultrasound examination of the left breast was performed. COMPARISON:  Previous exam(s). ACR Breast Density Category b: There are scattered areas of fibroglandular density. FINDINGS: Multiple new abutting masses are present within the outer LEFT breast, at posterior depth, with associated coarse heterogeneous calcifications, measuring approximately 3.8 x 2.2 cm. There is also a partially obscured mass within the outer LEFT breast, at middle to anterior depth, closer to the nipple, measuring 1 cm to 1.5 cm greatest dimension. There are no new dominant masses, suspicious calcifications or secondary signs of malignancy elsewhere within the LEFT breast. There are no new dominant masses, suspicious calcifications or secondary signs of malignancy within the RIGHT breast. Targeted ultrasound is performed, showing an irregular hypoechoic non-mass area within the LEFT breast at the 3 o'clock axis, 2 cm from the nipple, measuring 3.5 x 2.3 x 2.3 cm, likely corresponding to the multiple abutting masses with associated coarse heterogeneous calcifications seen on mammogram. An additional oval circumscribed hypoechoic mass is identified within the LEFT breast at the 3 o'clock axis, 1 cm from the nipple, measuring 9 x 6 x 6 mm, with adjacent dilated ducts, likely corresponding to the partially obscured mass seen on mammogram closer to the nipple. LEFT axilla was evaluated with ultrasound showing no enlarged or morphologically abnormal lymph nodes. IMPRESSION: 1. Irregular hypoechoic non-mass area within the LEFT breast at the 3 o'clock axis, 2 cm from the nipple, measuring 3.5 x 2.3 x 2.3 cm, likely correlate for the multiple abutting masses with associated coarse  heterogeneous calcifications seen on mammogram within the outer LEFT breast at posterior depth. Ultrasound-guided biopsy is recommended. 2. Additional hypoechoic mass in the LEFT breast at the 3 o'clock axis, 1 cm from the nipple, with internal vascularity, measuring 9 mm, a likely correlate for the mass seen on mammogram within the outer LEFT breast at middle to anterior depth. Ultrasound-guided biopsy is recommended. 3. No evidence of malignancy within the RIGHT breast. RECOMMENDATION: 1. Ultrasound-guided biopsy for the irregular hypoechoic non-mass area  within the LEFT breast at the 3 o'clock axis, labeled 2 cm from the nipple, presumed correlate for the multiple abutting masses with associated coarse heterogeneous calcifications seen on mammogram in the outer LEFT breast at posterior depth. 2. Ultrasound-guided biopsy for the hypoechoic mass in the LEFT breast at the 3 o'clock axis, 1 cm from the nipple, presumed correlate for the mass seen on mammogram within the outer LEFT breast at middle to anterior depth. 3. Attention to the postprocedure mammogram to ensure mammographic and sonographic correspondence for both sites. If either of the mammographic findings described above do not correspond to the sites of ultrasound-guided biopsy, stereotactic biopsy would then be needed. Ultrasound-guided biopsies are scheduled on February 20th. I have discussed the findings and recommendations with the patient. If applicable, a reminder letter will be sent to the patient regarding the next appointment. BI-RADS CATEGORY  4: Suspicious. Electronically Signed: By: Franki Cabot M.D. On: 07/14/2022 14:30  US BREAST LTD UNI LEFT INC AXILLA  Addendum Date: 07/14/2022   ADDENDUM REPORT: 07/14/2022 14:44 ADDENDUM: The irregular hypoechoic non-mass area within the LEFT breast at the 3 o'clock axis, presumed correlate for the multiple abutting masses with associated coarse heterogeneous calcifications seen on mammogram in the  outer LEFT breast at posterior depth, is labeled as 2 cm from the nipple on the ultrasound images. However, depending on patient positioning/rolling, this finding can be localized up to 4 cm from the nipple. Electronically Signed   By: Franki Cabot M.D.   On: 07/14/2022 14:44   Result Date: 07/14/2022 CLINICAL DATA:  Patient describes pain within the outer LEFT breast and an associated lump. Patient also has had clear nipple discharge in the past. EXAM: DIGITAL DIAGNOSTIC BILATERAL MAMMOGRAM WITH TOMOSYNTHESIS; ULTRASOUND LEFT BREAST LIMITED TECHNIQUE: Bilateral digital diagnostic mammography and breast tomosynthesis was performed.; Targeted ultrasound examination of the left breast was performed. COMPARISON:  Previous exam(s). ACR Breast Density Category b: There are scattered areas of fibroglandular density. FINDINGS: Multiple new abutting masses are present within the outer LEFT breast, at posterior depth, with associated coarse heterogeneous calcifications, measuring approximately 3.8 x 2.2 cm. There is also a partially obscured mass within the outer LEFT breast, at middle to anterior depth, closer to the nipple, measuring 1 cm to 1.5 cm greatest dimension. There are no new dominant masses, suspicious calcifications or secondary signs of malignancy elsewhere within the LEFT breast. There are no new dominant masses, suspicious calcifications or secondary signs of malignancy within the RIGHT breast. Targeted ultrasound is performed, showing an irregular hypoechoic non-mass area within the LEFT breast at the 3 o'clock axis, 2 cm from the nipple, measuring 3.5 x 2.3 x 2.3 cm, likely corresponding to the multiple abutting masses with associated coarse heterogeneous calcifications seen on mammogram. An additional oval circumscribed hypoechoic mass is identified within the LEFT breast at the 3 o'clock axis, 1 cm from the nipple, measuring 9 x 6 x 6 mm, with adjacent dilated ducts, likely corresponding to the  partially obscured mass seen on mammogram closer to the nipple. LEFT axilla was evaluated with ultrasound showing no enlarged or morphologically abnormal lymph nodes. IMPRESSION: 1. Irregular hypoechoic non-mass area within the LEFT breast at the 3 o'clock axis, 2 cm from the nipple, measuring 3.5 x 2.3 x 2.3 cm, likely correlate for the multiple abutting masses with associated coarse heterogeneous calcifications seen on mammogram within the outer LEFT breast at posterior depth. Ultrasound-guided biopsy is recommended. 2. Additional hypoechoic mass in the LEFT breast at the  3 o'clock axis, 1 cm from the nipple, with internal vascularity, measuring 9 mm, a likely correlate for the mass seen on mammogram within the outer LEFT breast at middle to anterior depth. Ultrasound-guided biopsy is recommended. 3. No evidence of malignancy within the RIGHT breast. RECOMMENDATION: 1. Ultrasound-guided biopsy for the irregular hypoechoic non-mass area within the LEFT breast at the 3 o'clock axis, labeled 2 cm from the nipple, presumed correlate for the multiple abutting masses with associated coarse heterogeneous calcifications seen on mammogram in the outer LEFT breast at posterior depth. 2. Ultrasound-guided biopsy for the hypoechoic mass in the LEFT breast at the 3 o'clock axis, 1 cm from the nipple, presumed correlate for the mass seen on mammogram within the outer LEFT breast at middle to anterior depth. 3. Attention to the postprocedure mammogram to ensure mammographic and sonographic correspondence for both sites. If either of the mammographic findings described above do not correspond to the sites of ultrasound-guided biopsy, stereotactic biopsy would then be needed. Ultrasound-guided biopsies are scheduled on February 20th. I have discussed the findings and recommendations with the patient. If applicable, a reminder letter will be sent to the patient regarding the next appointment. BI-RADS CATEGORY  4: Suspicious.  Electronically Signed: By: Franki Cabot M.D. 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 We discussed the general course of radiation, potential side effects, and toxicities with radiation and the patient is interested in this approach. ***   PLAN:  *** (copy notes from board at conference)   ------------------------------------------------  Blair Promise, PhD, MD  This document serves as a record of services personally performed by Gery Pray, MD. It was created on his behalf by Roney Mans, a trained medical scribe. The creation of this record is based on the scribe's personal [id number] and the provider's statements to them. This document has been checked and approved by the attending [email or web address]

## 2022-07-26 ENCOUNTER — Other Ambulatory Visit: Payer: Self-pay

## 2022-07-26 ENCOUNTER — Inpatient Hospital Stay: Payer: Commercial Managed Care - HMO | Attending: Hematology | Admitting: Hematology

## 2022-07-26 ENCOUNTER — Inpatient Hospital Stay: Payer: Commercial Managed Care - HMO

## 2022-07-26 ENCOUNTER — Ambulatory Visit: Payer: Self-pay | Admitting: Surgery

## 2022-07-26 ENCOUNTER — Ambulatory Visit
Admission: RE | Admit: 2022-07-26 | Discharge: 2022-07-26 | Disposition: A | Discharge status: Home / Self Care | Payer: Commercial Managed Care - HMO | Source: Ambulatory Visit | Attending: Radiation Oncology | Admitting: Radiation Oncology

## 2022-07-26 ENCOUNTER — Ambulatory Visit: Payer: Commercial Managed Care - HMO | Admitting: Physical Therapy

## 2022-07-26 ENCOUNTER — Inpatient Hospital Stay (HOSPITAL_BASED_OUTPATIENT_CLINIC_OR_DEPARTMENT_OTHER): Payer: Commercial Managed Care - HMO | Admitting: Genetic Counselor

## 2022-07-26 ENCOUNTER — Encounter: Payer: Self-pay | Admitting: Hematology

## 2022-07-26 VITALS — BP 157/86 | HR 113 | Temp 98.1°F | Resp 18 | Ht 61.0 in | Wt 166.9 lb

## 2022-07-26 DIAGNOSIS — D0512 Intraductal carcinoma in situ of left breast: Secondary | ICD-10-CM

## 2022-07-26 DIAGNOSIS — Z8042 Family history of malignant neoplasm of prostate: Secondary | ICD-10-CM | POA: Insufficient documentation

## 2022-07-26 DIAGNOSIS — Z803 Family history of malignant neoplasm of breast: Secondary | ICD-10-CM | POA: Diagnosis not present

## 2022-07-26 DIAGNOSIS — I10 Essential (primary) hypertension: Secondary | ICD-10-CM | POA: Insufficient documentation

## 2022-07-26 DIAGNOSIS — R5383 Other fatigue: Secondary | ICD-10-CM | POA: Insufficient documentation

## 2022-07-26 DIAGNOSIS — Z90710 Acquired absence of both cervix and uterus: Secondary | ICD-10-CM | POA: Insufficient documentation

## 2022-07-26 LAB — CMP (CANCER CENTER ONLY)
ALT: 9 U/L (ref 0–44)
AST: 8 U/L — ABNORMAL LOW (ref 15–41)
Albumin: 4.5 g/dL (ref 3.5–5.0)
Alkaline Phosphatase: 95 U/L (ref 38–126)
Anion gap: 9 (ref 5–15)
BUN: 12 mg/dL (ref 8–23)
CO2: 28 mmol/L (ref 22–32)
Calcium: 9.5 mg/dL (ref 8.9–10.3)
Chloride: 101 mmol/L (ref 98–111)
Creatinine: 0.81 mg/dL (ref 0.44–1.00)
GFR, Estimated: >60 mL/min (ref >60)
Glucose, Bld: 123 mg/dL — ABNORMAL HIGH (ref 70–99)
Potassium: 3.1 mmol/L — ABNORMAL LOW (ref 3.5–5.1)
Sodium: 138 mmol/L (ref 135–145)
Total Bilirubin: 0.5 mg/dL (ref 0.3–1.2)
Total Protein: 7.4 g/dL (ref 6.5–8.1)

## 2022-07-26 LAB — CBC WITH DIFFERENTIAL (CANCER CENTER ONLY)
Abs Immature Granulocytes: 0.02 10*3/uL (ref 0.00–0.07)
Basophils Absolute: 0.1 10*3/uL (ref 0.0–0.1)
Basophils Relative: 1 %
Eosinophils Absolute: 0.2 10*3/uL (ref 0.0–0.5)
Eosinophils Relative: 3 %
HCT: 39 % (ref 36.0–46.0)
Hemoglobin: 13.2 g/dL (ref 12.0–15.0)
Immature Granulocytes: 0 %
Lymphocytes Relative: 34 %
Lymphs Abs: 2.5 10*3/uL (ref 0.7–4.0)
MCH: 26.9 pg (ref 26.0–34.0)
MCHC: 33.8 g/dL (ref 30.0–36.0)
MCV: 79.4 fL — ABNORMAL LOW (ref 80.0–100.0)
Monocytes Absolute: 0.6 10*3/uL (ref 0.1–1.0)
Monocytes Relative: 8 %
Neutro Abs: 4.1 10*3/uL (ref 1.7–7.7)
Neutrophils Relative %: 54 %
Platelet Count: 353 10*3/uL (ref 150–400)
RBC: 4.91 MIL/uL (ref 3.87–5.11)
RDW: 13.9 % (ref 11.5–15.5)
WBC Count: 7.4 10*3/uL (ref 4.0–10.5)
nRBC: 0 % (ref 0.0–0.2)

## 2022-07-26 LAB — GENETIC SCREENING ORDER

## 2022-07-26 MED ORDER — POTASSIUM CHLORIDE CRYS ER 20 MEQ PO TBCR
20.0000 meq | EXTENDED_RELEASE_TABLET | Freq: Every day | ORAL | 0 refills | Status: DC
  Filled 2022-07-26: qty 30, 30d supply, fill #0

## 2022-07-26 NOTE — Progress Notes (Signed)
[place]   [phone] Fax:([phone]) [place] Note   Patient Care Team: Fenton Foy, NP as PCP - General (Pulmonary Disease) Erroll Luna, MD as Consulting Physician (General Surgery) Truitt Merle, MD as Consulting Physician (Hematology) Gery Pray, MD as Consulting Physician (Radiation Oncology) Rockwell Germany, RN as Oncology Nurse Navigator Mauro Kaufmann, RN as Oncology Nurse Navigator 07/26/2022  CHIEF COMPLAINTS/PURPOSE OF CONSULTATION:  Newly diagnosed left breast cancer  HISTORY OF PRESENTING ILLNESS:  Heidi Tran 62 y.o. female is here because of newly diagnosed left breast cancer.  She is accompanied by her aunt to the multidisciplinary breast clinic today.  She has not had routine screening mammogram since 2017, she noticed a small lump in the left breast a year ago, and noticed it has been slowly increasing in size, with moderate tenderness.  She underwent a diagnostic mammogram on July 14, 2022, which showed irregular known mass area in the left breast at the 3 o'clock position, 2 cm from the nipple, measuring 3.5 x 2.3 x 2.3 cm, and a additional small mass in the left breast at the 3 o'clock position, 1 cm from the nipple, measuring 9 mm.  X-ray was negative by ultrasound.  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  She has noticed mild fatigue over past year, still take care of her dad.  She is single, lives by herself.  She denies any other pain, or other new symptoms.  Weight has been stable.  She had a hysterectomy when she was 62 year old for fibroid tumor.   GYN HISTORY  Menarchal: 10 LMP: age of 18, before hysterectomy  Contraceptive:13 HRT: no G0P0:   MEDICAL HISTORY:  Past Medical History:  Diagnosis Date   Allergic rhinitis, cause unspecified    Asthma     Hypercholesteremia 12/2019   Hypertension    Shoulder strain, left, initial encounter 04/2019   Stroke [facility])    Unspecified essential hypertension    Vitamin D deficiency     SURGICAL HISTORY: Past Surgical History:  Procedure Laterality Date   ABDOMINAL HYSTERECTOMY     BREAST BIOPSY Left 07/18/2022   [place] LT BREAST BX W LOC DEV 1ST LESION IMG BX SPEC US GUIDE 07/18/2022 [facility]   BREAST BIOPSY Left 07/18/2022   [place] LT BREAST BX W LOC DEV EA ADD LESION IMG BX SPEC US GUIDE 07/18/2022 [facility]   TOTAL ABDOMINAL HYSTERECTOMY W/ BILATERAL SALPINGOOPHORECTOMY      SOCIAL HISTORY: Social History   Socioeconomic History   Marital status: Single    Spouse name: Not on file   Number of children: Not on file   Years of education: Not on file   Highest education level: Not on file  Occupational History   Occupation: Network engineer  Tobacco Use   Smoking status: Never   Smokeless tobacco: Never  Vaping Use   Vaping Use: Never used  Substance and Sexual Activity   Alcohol use: Never   Drug use: Never   Sexual activity: Not Currently  Other Topics Concern   Not on file  Social History Narrative   Not on file   Social Determinants of Health   Financial Resource Strain: Not on file  Food Insecurity: Not on file  Transportation Needs: Not on file  Physical Activity: Not on file  Stress: Not on file  Social  Connections: Not on file  Intimate Partner Violence: Not on file    FAMILY HISTORY: Family History  Problem Relation Age of Onset   Breast cancer Mother    Cancer Mother 82   Prostate cancer Father    Heart disease Father    Diabetes Father     ALLERGIES:  is allergic to statins, shellfish allergy, and sulfonamide derivatives.  MEDICATIONS:  Current Outpatient Medications  Medication Sig Dispense Refill   albuterol (PROVENTIL) (2.5 MG/3ML) 0.083% nebulizer solution TAKE 3 MLS (2.5 MG TOTAL) BY NEBULIZATION EVERY 6 (SIX) HOURS AS NEEDED. 360 mL 3    albuterol (VENTOLIN HFA) 108 (90 Base) MCG/ACT inhaler Inhale 2 puffs into the lungs every 6 (six) hours as needed for wheezing or shortness of breath. 18 g 12   fish oil-omega-3 fatty acids 1000 MG capsule Take 1 g by mouth daily.     fluticasone-salmeterol (ADVAIR DISKUS) 250-50 MCG/ACT AEPB Inhale 1 puff into the lungs in the morning and at bedtime. 60 each 0   hydrocortisone cream 1 % Apply 1 application topically 2 (two) times daily. 30 g 3   montelukast (SINGULAIR) 10 MG tablet Take 1 tablet (10 mg total) by mouth at bedtime. 30 tablet 3   Multiple Vitamin (MULTIVITAMIN) capsule Take 1 capsule by mouth daily. 30 capsule 6   naproxen (NAPROSYN) 500 MG tablet Take 1 tablet (500 mg total) by mouth 2 (two) times daily with a meal. 180 tablet 1   potassium chloride SA (KLOR-CON M) 20 MEQ tablet Take 1 tablet (20 mEq total) by mouth daily. 90 tablet 0   triamcinolone (KENALOG) 0.1 % Apply 1 application topically 2 (two) times daily. 30 g 3   triamterene-hydrochlorothiazide (MAXZIDE-25) 37.5-25 MG tablet Take 1 tablet by mouth daily. 90 tablet 1   No current facility-administered medications for this visit.    REVIEW OF SYSTEMS:   Constitutional: Denies fevers, chills or abnormal night sweats Eyes: Denies blurriness of vision, double vision or watery eyes Ears, nose, mouth, throat, and face: Denies mucositis or sore throat Respiratory: Denies cough, dyspnea or wheezes Cardiovascular: Denies palpitation, chest discomfort or lower extremity swelling Gastrointestinal:  Denies nausea, heartburn or change in bowel habits Skin: Denies abnormal skin rashes Lymphatics: Denies new lymphadenopathy or easy bruising Neurological:Denies numbness, tingling or new weaknesses Behavioral/Psych: Mood is stable, no new changes  All other systems were reviewed with the patient and are negative.  PHYSICAL EXAMINATION: ECOG PERFORMANCE STATUS: 0 - Asymptomatic  Vitals:   07/26/22 1255  BP: (!) 157/86   Pulse: (!) 113  Resp: 18  Temp: 98.1 F (36.7 C)  SpO2: 100%   Filed Weights   07/26/22 1255  Weight: 166 lb 14.4 oz (75.7 kg)    GENERAL:alert, no distress and comfortable SKIN: skin color, texture, turgor are normal, no rashes or significant lesions EYES: normal, conjunctiva are pink and non-injected, sclera clear OROPHARYNX:no exudate, no erythema and lips, buccal mucosa, and tongue normal  NECK: supple, thyroid normal size, non-tender, without nodularity LYMPH:  no palpable lymphadenopathy in the cervical, axillary or inguinal LUNGS: clear to auscultation and percussion with normal breathing effort HEART: regular rate & rhythm and no murmurs and no lower extremity edema ABDOMEN:abdomen soft, non-tender and normal bowel sounds Musculoskeletal:no cyanosis of digits and no clubbing  PSYCH: alert & oriented x 3 with fluent speech NEURO: no focal motor/sensory deficits Breasts: Breast inspection showed them to be symmetrical with no nipple discharge. Palpation of the left breast showed a 3 cm  and 1 cm masses in the outer lower quadrant of left breast, exam of the right breast and axilla revealed no obvious mass that I could appreciate.   LABORATORY DATA:  I have reviewed the data as listed    Latest Ref Rng & Units 07/26/2022   12:08 PM 02/01/2022   12:03 PM 01/21/2020   12:22 PM  CBC  WBC 4.0 - 10.5 K/uL 7.4  7.9  5.8   Hemoglobin 12.0 - 15.0 g/dL 13.2  12.9  12.6   Hematocrit 36.0 - 46.0 % 39.0  39.3  38.4   Platelets 150 - 400 K/uL 353  311  300     '@cmpl'$ @  RADIOGRAPHIC STUDIES: I have personally reviewed the radiological images as listed and agreed with the findings in the report. 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 This was found to be concordant by Dr. Ammie Ferrier. 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 This was found to be concordant by Dr. Ammie Ferrier. Pathology results were discussed with the patient by telephone. The patient reported doing well after the biopsies with tenderness at the sites. Post biopsy instructions and care were reviewed and questions were answered. The patient was encouraged to call [facility] for any additional concerns. My direct phone number was provided. The patient was referred to [facility] on July 26, 2022. Consideration for a bilateral breast MRI for further evaluation of extent of disease given the fragmented appearance of the masses. Pathology results reported by Terie Purser, RN on 07/19/2022. Electronically Signed   By: Ammie Ferrier M.D.   On: 07/19/2022 17:07   Result Date: 07/19/2022 CLINICAL DATA:  62 year old female presenting for ultrasound-guided biopsy of 2 sites in the left breast. EXAM: ULTRASOUND GUIDED LEFT BREAST CORE NEEDLE BIOPSY COMPARISON:  Previous exam(s). PROCEDURE: I met with the patient and we discussed the procedure of ultrasound-guided biopsy, including benefits and alternatives. We discussed the high likelihood of a successful procedure. We discussed the risks of the procedure, including infection, bleeding, tissue injury, clip migration, and inadequate sampling. Informed written consent was given. The usual time-out protocol was performed immediately prior to the procedure. Lesion quadrant: Upper outer quadrant Using ChloraPrep, sterile technique and 1% Lidocaine as deep local anesthetic, under direct ultrasound visualization, a 14 gauge spring-loaded device was used to perform biopsy of a mass in the left breast  at 3 o'clock, 2 cm from the nipple using an inferior approach. At the conclusion of the procedure a ribbon shaped tissue marker clip was deployed into the biopsy cavity. Lesion quadrant: Upper outer quadrant Using sterile technique and 1% Lidocaine as deep local anesthetic, under direct ultrasound visualization, a 14 gauge spring-loaded device was used to perform biopsy of a mass in the left breast at 3 o'clock, 1 cm from the nipple using a lateral approach. At the conclusion of the procedure a coil shaped tissue marker clip was deployed into the biopsy cavity. Follow up 2 view mammogram was performed and dictated separately. IMPRESSION: Ultrasound guided biopsy of 2 sites in the left breast at  3 o'clock. No apparent complications. Electronically Signed: By: Ammie Ferrier M.D. 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 This was found to be concordant by Dr. Ammie Ferrier. 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 This was found to be concordant by Dr. Ammie Ferrier. Pathology results were discussed with the patient by telephone. The patient reported doing well after the biopsies with tenderness at the sites. Post biopsy instructions and care were reviewed and questions were answered. The patient was encouraged to call [facility] for any additional concerns. My direct phone number was provided. The patient was referred to [facility] on July 26, 2022. Consideration for a bilateral breast MRI for further  evaluation of extent of disease given the fragmented appearance of the masses. Pathology results reported by Terie Purser, RN on 07/19/2022. Electronically Signed   By: Ammie Ferrier M.D.   On: 07/19/2022 17:07   Result Date: 07/19/2022 CLINICAL DATA:  62 year old female presenting for ultrasound-guided biopsy of 2 sites in the left breast. EXAM: ULTRASOUND GUIDED LEFT BREAST CORE NEEDLE BIOPSY COMPARISON:  Previous exam(s). PROCEDURE: I met with the patient and we discussed the procedure of ultrasound-guided biopsy, including benefits and alternatives. We discussed the high likelihood of a successful procedure. We discussed the risks of the procedure, including infection, bleeding, tissue injury, clip migration, and inadequate sampling. Informed written consent was given. The usual time-out protocol was performed immediately prior to the procedure. Lesion quadrant: Upper outer quadrant Using ChloraPrep, sterile technique and 1% Lidocaine as deep local anesthetic, under direct ultrasound visualization, a 14 gauge spring-loaded device was used to perform biopsy of a mass in the left breast at 3 o'clock, 2 cm from the nipple using an inferior approach. At the conclusion of the procedure a ribbon shaped tissue marker clip was deployed into the biopsy cavity. Lesion quadrant: Upper outer quadrant Using sterile technique and 1% Lidocaine as deep local anesthetic, under direct ultrasound visualization, a 14 gauge spring-loaded device was used to perform biopsy of a mass in the left breast at 3 o'clock, 1 cm from the nipple using a lateral approach. At the conclusion of the procedure a coil shaped tissue marker clip was deployed into the biopsy cavity. Follow up 2 view mammogram was performed and dictated separately. IMPRESSION: Ultrasound guided biopsy of 2 sites in the left breast at 3 o'clock. No apparent complications. Electronically Signed: By: Ammie Ferrier M.D. On: 07/18/2022 14:58  MM CLIP PLACEMENT  LEFT  Result Date: 07/18/2022 CLINICAL DATA:  Post biopsy mammogram of the left breast for clip placement. EXAM: 3D DIAGNOSTIC LEFT MAMMOGRAM POST ULTRASOUND BIOPSY COMPARISON:  Previous exam(s). FINDINGS: 3D Mammographic images were obtained following ultrasound guided biopsy of 2 masses in the left breast. The biopsy marking clips are in expected position at the sites of biopsy. IMPRESSION: 1. Appropriate positioning of the ribbon shaped biopsy marking clip at the site of biopsy in the lateral posterior left breast. 2. Appropriate positioning of the coil shaped biopsy marking clip in the lateral anterior left breast. Final Assessment:  Post Procedure Mammograms for Marker Placement Electronically Signed   By: Ammie Ferrier M.D.   On: 07/18/2022 16:03  MM DIAG BREAST TOMO BILATERAL  Addendum Date: 07/14/2022   ADDENDUM REPORT: 07/14/2022 14:44 ADDENDUM: The irregular hypoechoic non-mass area within the LEFT breast at the 3 o'clock axis, presumed correlate for the multiple abutting masses with associated coarse heterogeneous calcifications seen on mammogram in the outer LEFT breast at posterior depth, is labeled as 2 cm from the nipple on the ultrasound images. However, depending on patient positioning/rolling, this finding can be localized up to 4 cm from the nipple. Electronically Signed   By: Franki Cabot M.D.   On: 07/14/2022 14:44   Result Date: 07/14/2022 CLINICAL DATA:  Patient describes pain within the outer LEFT breast and an associated lump. Patient also has had clear nipple discharge in the past. EXAM: DIGITAL DIAGNOSTIC BILATERAL MAMMOGRAM WITH TOMOSYNTHESIS; ULTRASOUND LEFT BREAST LIMITED TECHNIQUE: Bilateral digital diagnostic mammography and breast tomosynthesis was performed.; Targeted ultrasound examination of the left breast was performed. COMPARISON:  Previous exam(s). ACR Breast Density Category b: There are scattered areas of fibroglandular density. FINDINGS: Multiple new abutting  masses are present within the outer LEFT breast, at posterior depth, with associated coarse heterogeneous calcifications, measuring approximately 3.8 x 2.2 cm. There is also a partially obscured mass within the outer LEFT breast, at middle to anterior depth, closer to the nipple, measuring 1 cm to 1.5 cm greatest dimension. There are no new dominant masses, suspicious calcifications or secondary signs of malignancy elsewhere within the LEFT breast. There are no new dominant masses, suspicious calcifications or secondary signs of malignancy within the RIGHT breast. Targeted ultrasound is performed, showing an irregular hypoechoic non-mass area within the LEFT breast at the 3 o'clock axis, 2 cm from the nipple, measuring 3.5 x 2.3 x 2.3 cm, likely corresponding to the multiple abutting masses with associated coarse heterogeneous calcifications seen on mammogram. An additional oval circumscribed hypoechoic mass is identified within the LEFT breast at the 3 o'clock axis, 1 cm from the nipple, measuring 9 x 6 x 6 mm, with adjacent dilated ducts, likely corresponding to the partially obscured mass seen on mammogram closer to the nipple. LEFT axilla was evaluated with ultrasound showing no enlarged or morphologically abnormal lymph nodes. IMPRESSION: 1. Irregular hypoechoic non-mass area within the LEFT breast at the 3 o'clock axis, 2 cm from the nipple, measuring 3.5 x 2.3 x 2.3 cm, likely correlate for the multiple abutting masses with associated coarse heterogeneous calcifications seen on mammogram within the outer LEFT breast at posterior depth. Ultrasound-guided biopsy is recommended. 2. Additional hypoechoic mass in the LEFT breast at the 3 o'clock axis, 1 cm from the nipple, with internal vascularity, measuring 9 mm, a likely correlate for the mass seen on mammogram within the outer LEFT breast at middle to anterior depth. Ultrasound-guided biopsy is recommended. 3. No evidence of malignancy within the RIGHT breast.  RECOMMENDATION: 1. Ultrasound-guided biopsy for the irregular hypoechoic non-mass area within the LEFT breast at the 3 o'clock axis, labeled 2 cm from the nipple, presumed correlate for the multiple abutting masses with associated coarse heterogeneous calcifications seen on mammogram in the outer LEFT breast at posterior depth. 2. Ultrasound-guided biopsy for the hypoechoic mass in the LEFT breast at the 3 o'clock axis, 1 cm from the nipple, presumed correlate for the mass seen on mammogram within the outer LEFT breast at middle to anterior depth. 3. Attention to the postprocedure mammogram to ensure mammographic and sonographic  correspondence for both sites. If either of the mammographic findings described above do not correspond to the sites of ultrasound-guided biopsy, stereotactic biopsy would then be needed. Ultrasound-guided biopsies are scheduled on February 20th. I have discussed the findings and recommendations with the patient. If applicable, a reminder letter will be sent to the patient regarding the next appointment. BI-RADS CATEGORY  4: Suspicious. Electronically Signed: By: Franki Cabot M.D. On: 07/14/2022 14:30  US BREAST LTD UNI LEFT INC AXILLA  Addendum Date: 07/14/2022   ADDENDUM REPORT: 07/14/2022 14:44 ADDENDUM: The irregular hypoechoic non-mass area within the LEFT breast at the 3 o'clock axis, presumed correlate for the multiple abutting masses with associated coarse heterogeneous calcifications seen on mammogram in the outer LEFT breast at posterior depth, is labeled as 2 cm from the nipple on the ultrasound images. However, depending on patient positioning/rolling, this finding can be localized up to 4 cm from the nipple. Electronically Signed   By: Franki Cabot M.D.   On: 07/14/2022 14:44   Result Date: 07/14/2022 CLINICAL DATA:  Patient describes pain within the outer LEFT breast and an associated lump. Patient also has had clear nipple discharge in the past. EXAM: DIGITAL  DIAGNOSTIC BILATERAL MAMMOGRAM WITH TOMOSYNTHESIS; ULTRASOUND LEFT BREAST LIMITED TECHNIQUE: Bilateral digital diagnostic mammography and breast tomosynthesis was performed.; Targeted ultrasound examination of the left breast was performed. COMPARISON:  Previous exam(s). ACR Breast Density Category b: There are scattered areas of fibroglandular density. FINDINGS: Multiple new abutting masses are present within the outer LEFT breast, at posterior depth, with associated coarse heterogeneous calcifications, measuring approximately 3.8 x 2.2 cm. There is also a partially obscured mass within the outer LEFT breast, at middle to anterior depth, closer to the nipple, measuring 1 cm to 1.5 cm greatest dimension. There are no new dominant masses, suspicious calcifications or secondary signs of malignancy elsewhere within the LEFT breast. There are no new dominant masses, suspicious calcifications or secondary signs of malignancy within the RIGHT breast. Targeted ultrasound is performed, showing an irregular hypoechoic non-mass area within the LEFT breast at the 3 o'clock axis, 2 cm from the nipple, measuring 3.5 x 2.3 x 2.3 cm, likely corresponding to the multiple abutting masses with associated coarse heterogeneous calcifications seen on mammogram. An additional oval circumscribed hypoechoic mass is identified within the LEFT breast at the 3 o'clock axis, 1 cm from the nipple, measuring 9 x 6 x 6 mm, with adjacent dilated ducts, likely corresponding to the partially obscured mass seen on mammogram closer to the nipple. LEFT axilla was evaluated with ultrasound showing no enlarged or morphologically abnormal lymph nodes. IMPRESSION: 1. Irregular hypoechoic non-mass area within the LEFT breast at the 3 o'clock axis, 2 cm from the nipple, measuring 3.5 x 2.3 x 2.3 cm, likely correlate for the multiple abutting masses with associated coarse heterogeneous calcifications seen on mammogram within the outer LEFT breast at posterior  depth. Ultrasound-guided biopsy is recommended. 2. Additional hypoechoic mass in the LEFT breast at the 3 o'clock axis, 1 cm from the nipple, with internal vascularity, measuring 9 mm, a likely correlate for the mass seen on mammogram within the outer LEFT breast at middle to anterior depth. Ultrasound-guided biopsy is recommended. 3. No evidence of malignancy within the RIGHT breast. RECOMMENDATION: 1. Ultrasound-guided biopsy for the irregular hypoechoic non-mass area within the LEFT breast at the 3 o'clock axis, labeled 2 cm from the nipple, presumed correlate for the multiple abutting masses with associated coarse heterogeneous calcifications seen on mammogram in the  outer LEFT breast at posterior depth. 2. Ultrasound-guided biopsy for the hypoechoic mass in the LEFT breast at the 3 o'clock axis, 1 cm from the nipple, presumed correlate for the mass seen on mammogram within the outer LEFT breast at middle to anterior depth. 3. Attention to the postprocedure mammogram to ensure mammographic and sonographic correspondence for both sites. If either of the mammographic findings described above do not correspond to the sites of ultrasound-guided biopsy, stereotactic biopsy would then be needed. Ultrasound-guided biopsies are scheduled on February 20th. I have discussed the findings and recommendations with the patient. If applicable, a reminder letter will be sent to the patient regarding the next appointment. BI-RADS CATEGORY  4: Suspicious. Electronically Signed: By: Franki Cabot M.D. On: 07/14/2022 14:30   ASSESSMENT & PLAN:   62 yo menopausal woman, presented with palpable left breast mass.    1. Left breast DCIS, grade 2, ER+ /PR+ -I discussed her breast imaging and needle biopsy results with patient and her family members in great detail. -She is a candidate for breast conservation surgery. She has been seen by breast surgeon Dr. Brantley Stage who recommends lumpectomy. -Given her strong family history of  breast cancer in her mother at young age, we recommend her to undergo genetic testing to ruled out inheritable breast cancer -Her DCIS will be cured by complete surgical resection. Any form of adjuvant therapy is preventive. -Given her strongly positive/negative ER and PR, I do/not recommend antiestrogen therapy with Tamoxifen, which decrease her risk of future breast cancer by ~40%.  -She will likely benefit from breast radiation if she undergo lumpectomy to decrease the risk of breast cancer. -I used [facility] DCIS calculator, her risk of future breast cancer will be 22% in the next 10 years with surgery alone, it would drop to 9-11% with radiation or antiestrogen therapy alone, and 4% if she undergo both radiation and antiestrogen therapy.  She is interested in both radiation and tamoxifen. -We also discussed that biopsy may have sampling limitation, we will review her surgical path, to see if she has any invasive carcinoma components. -We discussed breast cancer surveillance after she completes treatment, Including annual mammogram, breast exam every 6-12 months.  We also discussed screening breast MRI due to her high risk for future breast cancer.  Plan -She will likely have lumpectomy soon -Genetic refer -I'll see her at the end of radiation and finalize her antiestrogen therapy.     No orders of the defined types were placed in this encounter.   All questions were answered. The patient knows to call the clinic with any problems, questions or concerns. I spent 35 minutes counseling the patient face to face. The total time spent in the appointment was 45 minutes and more than 50% was on counseling.     Truitt Merle, MD 07/26/2022 2:41 PM

## 2022-07-27 ENCOUNTER — Encounter: Payer: Self-pay | Admitting: Genetic Counselor

## 2022-07-27 ENCOUNTER — Other Ambulatory Visit: Payer: Self-pay | Admitting: Nurse Practitioner

## 2022-07-27 DIAGNOSIS — J4540 Moderate persistent asthma, uncomplicated: Secondary | ICD-10-CM

## 2022-07-27 NOTE — Progress Notes (Signed)
REFERRING PROVIDER: Truitt Merle, MD [address] [place],  [place] [address]  PRIMARY PROVIDER:  Fenton Foy, NP  PRIMARY REASON FOR VISIT:  1. Ductal carcinoma in situ (DCIS) of left breast   2. Family history of breast cancer   3. Family history of prostate cancer      HISTORY OF PRESENT ILLNESS:   Heidi Tran, a 62 y.o. female, was seen for a [place] cancer genetics consultation at the request of Dr. Burr Medico due to a personal and family history of breast cancer.  Heidi Tran presents to clinic today to discuss the possibility of a hereditary predisposition to cancer, to discuss genetic testing, and to further clarify her future cancer risks, as well as potential cancer risks for family members.   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 The treatment plan is pending.   Past Medical History:  Diagnosis Date   Allergic rhinitis, cause unspecified    Asthma    Hypercholesteremia 12/2019   Hypertension    Shoulder strain, left, initial encounter 04/2019   Stroke [facility])    Unspecified essential hypertension    Vitamin D deficiency     Past Surgical History:  Procedure Laterality Date   ABDOMINAL HYSTERECTOMY     BREAST BIOPSY Left 07/18/2022   [place] LT BREAST BX W LOC DEV 1ST LESION IMG BX SPEC US GUIDE 07/18/2022 [facility]   BREAST BIOPSY Left 07/18/2022   [place] LT BREAST BX W LOC DEV EA ADD LESION IMG BX SPEC US GUIDE 07/18/2022 [facility]   TOTAL ABDOMINAL HYSTERECTOMY W/ BILATERAL SALPINGOOPHORECTOMY      FAMILY HISTORY:  We obtained a detailed, 4-generation family history.  Significant diagnoses are listed below: Family History  Problem Relation Age of Onset   Breast cancer Mother 72       d. 33   Prostate cancer Father        dx 43s   Bone cancer Paternal Aunt 74       or other primary?   Cancer Paternal Aunt        appendix; early stage; dx after 20   Cancer  Paternal Aunt        unknown type; dx 61s; ? early stage/minimal treatment     Heidi Tran is unaware of previous family history of genetic testing for hereditary cancer risks. There is no reported Ashkenazi Jewish ancestry. There is no known consanguinity.  GENETIC COUNSELING ASSESSMENT: Heidi Tran is a 62 y.o. female with a personal and family history which is somewhat suggestive of a hereditary cancer syndrome given the presence of breast cancer in multiple generations, including her mother's diagnosis before the age of 15. We, therefore, discussed and recommended the following at today's visit.   DISCUSSION: We discussed that 5 - 10% of cancer is hereditary.  Most cases of hereditary breast cancer are associated with mutations in BRCA1/2.  There are other genes that can be associated with hereditary breast cancer syndromes.  We discussed that testing is beneficial for several reasons including knowing how to follow individuals for their cancer risks, identifying whether potential surgery options would be beneficial, and understanding if other family members could be at risk for cancer and allowing them to undergo genetic testing.   We reviewed the characteristics, features and inheritance patterns of hereditary cancer syndromes. We also discussed genetic testing, including the appropriate family members to test, the process  of testing, insurance coverage and turn-around-time for results. We discussed the implications of a negative, positive, carrier and/or variant of uncertain significant result. We recommended Heidi Tran pursue genetic testing for the [facility] STAT Breast Cancer Panel with reflex to a more comprehensive panel that includes genes associated with breast, prostate, and other cancers.  The STAT Breast cancer panel offered by [facility] includes sequencing and rearrangement analysis for the following 9 genes:  ATM, BRCA1, BRCA2, CDH1, CHEK2, PALB2, PTEN, STK11 and TP53.     The [facility]  Common Hereditary Cancers + RNA Panel includes sequencing, deletion/duplication, and RNA analysis of the following 48 genes: APC, ATM, AXIN2, BAP1, BARD1, BMPR1A, BRCA1, BRCA2, BRIP1, CDH1, CDK4*, CDKN2A*, CHEK2, CTNNA1, DICER1, EPCAM* (del/dup only), FH, GREM1* (promoter dup analysis only), HOXB13*, KIT*, MBD4*, MEN1, MLH1, MSH2, MSH3, MSH6, MUTYH, NF1, NTHL1, PALB2, PDGFRA*, PMS2, POLD1, POLE, PTEN, RAD51C, RAD51D, SDHA (sequencing only), SDHB, SDHC, SDHD, SMAD4, SMARCA4, STK11, TP53, TSC1, TSC2, VHL.  *Genes without RNA analysis.    Based on Heidi Tran's personal and family history of cancer, she meets medical criteria for genetic testing. Despite that she meets criteria, she may still have an out of pocket cost. We discussed that if her out of pocket cost for testing is over $100, the laboratory should contact her and discuss the self-pay prices and/or patient pay assistance programs.    PLAN: After considering the risks, benefits, and limitations, Heidi Tran provided informed consent to pursue genetic testing and the blood sample was sent to [facility] for analysis of the Breast STAT and Common Hereditary Cancers +RNA Panel. Results should be available within approximately 2-3 weeks' time, at which point they will be disclosed by telephone to Heidi Tran, as will any additional recommendations warranted by these results. Heidi Tran will receive a summary of her genetic counseling visit and a copy of her results once available. This information will also be available in Epic.   Heidi Tran questions were answered to her satisfaction today. Our contact information was provided should additional questions or concerns arise. Thank you for the referral and allowing [place] to share in the care of your patient.   Zygmunt Mcglinn M. Joette Catching, [place], [facility] Genetic Counselor [email or web address] (P) [phone]  The patient was seen for a total of 15 minutes in face-to-face genetic counseling.  The patient  was accompanied by her aunt.  Dr. Burr Medico was  available to discuss this case as needed.    _______________________________________________________________________ For Office Staff:  Number of people involved in session: 2 Was an Intern/ student involved with case: no

## 2022-07-28 ENCOUNTER — Other Ambulatory Visit: Payer: Self-pay

## 2022-07-28 MED ORDER — FLUTICASONE-SALMETEROL 250-50 MCG/ACT IN AEPB
1.0000 | INHALATION_SPRAY | Freq: Two times a day (BID) | RESPIRATORY_TRACT | 0 refills | Status: DC
  Filled 2022-07-28: qty 60, 30d supply, fill #0

## 2022-07-28 NOTE — Telephone Encounter (Signed)
Please advise KH 

## 2022-07-31 ENCOUNTER — Other Ambulatory Visit: Payer: Self-pay

## 2022-08-01 ENCOUNTER — Encounter: Payer: Self-pay | Admitting: *Deleted

## 2022-08-01 ENCOUNTER — Other Ambulatory Visit: Payer: Self-pay | Admitting: Surgery

## 2022-08-01 DIAGNOSIS — D0512 Intraductal carcinoma in situ of left breast: Secondary | ICD-10-CM

## 2022-08-03 ENCOUNTER — Other Ambulatory Visit: Payer: Self-pay

## 2022-08-03 ENCOUNTER — Telehealth: Payer: Self-pay | Admitting: Genetic Counselor

## 2022-08-03 ENCOUNTER — Ambulatory Visit (INDEPENDENT_AMBULATORY_CARE_PROVIDER_SITE_OTHER): Payer: Medicare Other | Admitting: Nurse Practitioner

## 2022-08-03 ENCOUNTER — Ambulatory Visit: Payer: Self-pay | Admitting: Genetic Counselor

## 2022-08-03 ENCOUNTER — Encounter: Payer: Self-pay | Admitting: Genetic Counselor

## 2022-08-03 ENCOUNTER — Encounter: Payer: Self-pay | Admitting: Nurse Practitioner

## 2022-08-03 VITALS — BP 138/79 | HR 105 | Temp 98.9°F | Ht 62.5 in | Wt 167.8 lb

## 2022-08-03 DIAGNOSIS — Z1211 Encounter for screening for malignant neoplasm of colon: Secondary | ICD-10-CM | POA: Diagnosis not present

## 2022-08-03 DIAGNOSIS — I10 Essential (primary) hypertension: Secondary | ICD-10-CM | POA: Diagnosis not present

## 2022-08-03 DIAGNOSIS — Z803 Family history of malignant neoplasm of breast: Secondary | ICD-10-CM

## 2022-08-03 DIAGNOSIS — Z8042 Family history of malignant neoplasm of prostate: Secondary | ICD-10-CM

## 2022-08-03 DIAGNOSIS — Z1379 Encounter for other screening for genetic and chromosomal anomalies: Secondary | ICD-10-CM | POA: Insufficient documentation

## 2022-08-03 DIAGNOSIS — D0512 Intraductal carcinoma in situ of left breast: Secondary | ICD-10-CM

## 2022-08-03 DIAGNOSIS — Z01818 Encounter for other preprocedural examination: Secondary | ICD-10-CM

## 2022-08-03 DIAGNOSIS — J4540 Moderate persistent asthma, uncomplicated: Secondary | ICD-10-CM | POA: Diagnosis not present

## 2022-08-03 MED ORDER — POTASSIUM CHLORIDE CRYS ER 20 MEQ PO TBCR
20.0000 meq | EXTENDED_RELEASE_TABLET | Freq: Every day | ORAL | 0 refills | Status: DC
  Filled 2022-08-03: qty 90, 90d supply, fill #0
  Filled 2022-08-28: qty 30, 30d supply, fill #0
  Filled 2022-09-28: qty 30, 30d supply, fill #1
  Filled 2022-10-27: qty 30, 30d supply, fill #2

## 2022-08-03 MED ORDER — FLUTICASONE-SALMETEROL 250-50 MCG/ACT IN AEPB
1.0000 | INHALATION_SPRAY | Freq: Two times a day (BID) | RESPIRATORY_TRACT | 0 refills | Status: DC
  Filled 2022-08-03 – 2022-08-28 (×2): qty 60, 30d supply, fill #0

## 2022-08-03 MED ORDER — MONTELUKAST SODIUM 10 MG PO TABS
10.0000 mg | ORAL_TABLET | Freq: Every day | ORAL | 3 refills | Status: DC
  Filled 2022-08-03 – 2022-08-14 (×2): qty 30, 30d supply, fill #0
  Filled 2022-09-13: qty 30, 30d supply, fill #1
  Filled 2022-10-16: qty 30, 30d supply, fill #2
  Filled 2022-11-13: qty 30, 30d supply, fill #3

## 2022-08-03 NOTE — Progress Notes (Signed)
$'@Patient'L$  ID: Heidi Tran, female    DOB: 20-Aug-1960, 62 y.o.   MRN: [id number]  Chief Complaint  Patient presents with   Follow-up    Hypertension.    Referring provider: Fenton Foy, NP   HPI  Heidi Tran 62 y.o. female  has a past medical history of Allergic rhinitis, cause unspecified, Asthma, Hypercholesteremia (12/2019), Hypertension, Shoulder strain, left, initial encounter (04/2019), Unspecified essential hypertension, and Vitamin D deficiency.   Hypertension:   Patient presents today for hypertension.  Patient states that she has stopped taking her amlodipine.  She states that it was making her feel weak and dizzy.  She is compliant with taking Maxide.  Her blood pressure was a little elevated in office today.Overall she states that she is doing well.  Patient states that she has been taking Breo and it is not working for her.  She previously was on Advair but this was switched due to insurance.  She would like for her prescription of Advair to be called back into the pharmacy.  We will also start patient on Singulair at night since she is having more difficulty with her asthma since switching to [facility].  Hopefully we can get the Advair approved.  Denies f/c/s, n/v/d, hemoptysis, PND, leg swelling Denies chest pain or edema   Note: lumpectomy scheduled on march 21st.    [place] Postperative Pulmonary Risk Score - for mech ventilation dependence >3-5 days Comment Score  Type of surgery - abd ao aneurysm (27), thoracic (21), neurosurgery / upper abdominal / vascular (21), neck (11)    Emergency Surgery - (11)    ALbumin < 3 or poor nutritional state - (9)    BUN > 30 -  (8)    Partial or completely dependent functional status - (7)    COPD -  (6)    Age - 48 to 29 (4), > 70  (6)    TOTAL    Risk Stratifcation scores  - < 10, 11-19, 20-27, 28-40, >40  4     CANET Postperative Pulmonary Risk Score -for any pulm complication Comment Score  Age - <50 (0), 50-80  (3), >80 (16) 3 3  Preoperative pulse ox - >96 (0), 91-95 (8), <90 (24) 0   Respiratory infection in last month - Yes (17) 0   Preoperative anemia - < 10gm% - Yes (11) 0   Surgical incision - Upper abdominal (15), Thoracic (24) 0   Duration of surgery - <2h (0), 2-3h (16), >3h (23) 0   Emergency Surgery - Yes (8) 0   TOTAL    Risk Stratification - Low (<26), Intermediate (26-44), High (>45) low     Major Pulmonary risks identified in the multifactorial risk analysis are but not limited to a) pneumonia; b) recurrent intubation risk; c) prolonged or recurrent acute respiratory failure needing mechanical ventilation; d) prolonged hospitalization; e) DVT/Pulmonary embolism; f) Acute Pulmonary edema  Recommend 1. Short duration of surgery as much as possible and avoid paralytic if possible 2. Recovery in step down or ICU with Pulmonary consultation if needed 3. DVT prophylaxis as indicated 4. Aggressive pulmonary toilet with O2, bronchodilatation, and incentive spirometry and early ambulation     Allergies  Allergen Reactions   Statins Other (See Comments)    Leg cramps   Shellfish Allergy Swelling    Throat, lips and tongue swelling   Sulfonamide Derivatives     REACTION: rash    Immunization History  Administered Date(s) Administered   Influenza  Split 02/17/2011, 03/12/2012, 01/27/2013, 02/26/2014   Influenza Whole 02/26/2009, 02/26/2010   Influenza,inj,Quad PF,6+ Mos 07/17/2017, 02/18/2018, 01/31/2019, 03/18/2020, 03/24/2021, 05/04/2022   PFIZER(Purple Top)SARS-COV-2 Vaccination 08/19/2019, 09/16/2019, 04/26/2020   Pneumococcal Polysaccharide-23 05/29/2006    Past Medical History:  Diagnosis Date   Allergic rhinitis, cause unspecified    Asthma    Hypercholesteremia 12/2019   Hypertension    Shoulder strain, left, initial encounter 04/2019   Stroke [facility])    Unspecified essential hypertension    Vitamin D deficiency     Tobacco History: Social History   Tobacco  Use  Smoking Status Never  Smokeless Tobacco Never   Counseling given: Not Answered   Outpatient Encounter Medications as of 08/03/2022  Medication Sig   albuterol (PROVENTIL) (2.5 MG/3ML) 0.083% nebulizer solution TAKE 3 MLS (2.5 MG TOTAL) BY NEBULIZATION EVERY 6 (SIX) HOURS AS NEEDED.   albuterol (VENTOLIN HFA) 108 (90 Base) MCG/ACT inhaler Inhale 2 puffs into the lungs every 6 (six) hours as needed for wheezing or shortness of breath.   fish oil-omega-3 fatty acids 1000 MG capsule Take 1 g by mouth daily.   hydrocortisone cream 1 % Apply 1 application topically 2 (two) times daily.   Multiple Vitamin (MULTIVITAMIN) capsule Take 1 capsule by mouth daily.   naproxen (NAPROSYN) 500 MG tablet Take 1 tablet (500 mg total) by mouth 2 (two) times daily with a meal.   triamcinolone (KENALOG) 0.1 % Apply 1 application topically 2 (two) times daily.   triamterene-hydrochlorothiazide (MAXZIDE-25) 37.5-25 MG tablet Take 1 tablet by mouth daily.   [DISCONTINUED] fluticasone-salmeterol (ADVAIR DISKUS) 250-50 MCG/ACT AEPB Inhale 1 puff into the lungs in the morning and at bedtime.   [DISCONTINUED] montelukast (SINGULAIR) 10 MG tablet Take 1 tablet (10 mg total) by mouth at bedtime.   [DISCONTINUED] potassium chloride SA (KLOR-CON M) 20 MEQ tablet Take 1 tablet (20 mEq total) by mouth daily.   fluticasone-salmeterol (ADVAIR DISKUS) 250-50 MCG/ACT AEPB Inhale 1 puff into the lungs in the morning and at bedtime.   montelukast (SINGULAIR) 10 MG tablet Take 1 tablet (10 mg total) by mouth at bedtime.   potassium chloride SA (KLOR-CON M) 20 MEQ tablet Take 1 tablet (20 mEq total) by mouth daily.   No facility-administered encounter medications on file as of 08/03/2022.     Review of Systems  Review of Systems  Constitutional: Negative.   HENT: Negative.    Cardiovascular: Negative.   Gastrointestinal: Negative.   Allergic/Immunologic: Negative.   Neurological: Negative.   Psychiatric/Behavioral:  Negative.         Physical Exam  BP 138/79   Pulse (!) 105   Temp 98.9 F (37.2 C)   Ht 5' 2.5" (1.588 m)   Wt 167 lb 12.8 oz (76.1 kg)   SpO2 97%   BMI 30.20 kg/m   Wt Readings from Last 5 Encounters:  08/03/22 167 lb 12.8 oz (76.1 kg)  07/26/22 166 lb 14.4 oz (75.7 kg)  05/04/22 169 lb (76.7 kg)  02/01/22 176 lb (79.8 kg)  11/01/21 173 lb 8 oz (78.7 kg)     Physical Exam Vitals and nursing note reviewed.  Constitutional:      General: She is not in acute distress.    Appearance: She is well-developed.  Cardiovascular:     Rate and Rhythm: Normal rate and regular rhythm.  Pulmonary:     Effort: Pulmonary effort is normal.     Breath sounds: Normal breath sounds.  Neurological:     Mental Status:  She is alert and oriented to person, place, and time.      Lab Results:  CBC    Component Value Date/Time   WBC 7.4 07/26/2022 1208   WBC 7.7 09/30/2017 1040   RBC 4.91 07/26/2022 1208   HGB 13.2 07/26/2022 1208   HGB 12.9 02/01/2022 1203   HCT 39.0 07/26/2022 1208   HCT 39.3 02/01/2022 1203   PLT 353 07/26/2022 1208   PLT 311 02/01/2022 1203   MCV 79.4 (L) 07/26/2022 1208   MCV 81 02/01/2022 1203   MCH 26.9 07/26/2022 1208   MCHC 33.8 07/26/2022 1208   RDW 13.9 07/26/2022 1208   RDW 13.6 02/01/2022 1203   LYMPHSABS 2.5 07/26/2022 1208   LYMPHSABS 2.1 01/21/2020 1222   MONOABS 0.6 07/26/2022 1208   EOSABS 0.2 07/26/2022 1208   EOSABS 0.1 01/21/2020 1222   BASOSABS 0.1 07/26/2022 1208   BASOSABS 0.1 01/21/2020 1222    BMET    Component Value Date/Time   NA 138 07/26/2022 1208   NA 141 02/01/2022 1203   K 3.1 (L) 07/26/2022 1208   CL 101 07/26/2022 1208   CO2 28 07/26/2022 1208   GLUCOSE 123 (H) 07/26/2022 1208   BUN 12 07/26/2022 1208   BUN 12 02/01/2022 1203   CREATININE 0.81 07/26/2022 1208   CALCIUM 9.5 07/26/2022 1208   GFRNONAA >60 07/26/2022 1208   GFRAA 92 01/21/2020 1222    BNP No results found for: "BNP"  ProBNP    Component  Value Date/Time   PROBNP 22.0 07/13/2008 1103    Imaging: [place] LT BREAST BX W LOC DEV 1ST LESION IMG BX SPEC US GUIDE  Addendum Date: 07/19/2022   ADDENDUM REPORT: 07/19/2022 17:07 ADDENDUM: Pathology revealed DUCTAL CARCINOMA IN SITU, 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 This was found to be concordant by Dr. Ammie Ferrier. 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 This was found to be concordant by Dr. Ammie Ferrier. Pathology results were discussed with the patient by telephone. The patient reported doing well after the biopsies with tenderness at the sites. Post biopsy instructions and care were reviewed and questions were answered. The patient was encouraged to call [facility] for any additional concerns. My direct phone number was provided. The patient was referred to [facility] on July 26, 2022. Consideration for a bilateral breast MRI for further evaluation of extent of disease given the fragmented appearance of the masses. Pathology results reported by Terie Purser, RN on 07/19/2022. Electronically Signed   By: Ammie Ferrier M.D.   On: 07/19/2022 17:07   Result Date: 07/19/2022 CLINICAL DATA:  62 year old female presenting for ultrasound-guided biopsy of 2 sites in the left breast. EXAM: ULTRASOUND GUIDED LEFT BREAST CORE NEEDLE BIOPSY COMPARISON:  Previous exam(s). PROCEDURE: I met with the patient and we discussed the procedure of ultrasound-guided biopsy, including benefits and alternatives. We discussed the high likelihood of a successful procedure. We discussed the risks of the procedure, including infection, bleeding, tissue injury, clip migration, and inadequate sampling.  Informed written consent was given. The usual time-out protocol was performed immediately prior to the procedure. Lesion quadrant: Upper outer quadrant Using ChloraPrep, sterile technique and 1% Lidocaine as deep local anesthetic, under direct ultrasound visualization, a 14  gauge spring-loaded device was used to perform biopsy of a mass in the left breast at 3 o'clock, 2 cm from the nipple using an inferior approach. At the conclusion of the procedure a ribbon shaped tissue marker clip was deployed into the biopsy cavity. Lesion quadrant: Upper outer quadrant Using sterile technique and 1% Lidocaine as deep local anesthetic, under direct ultrasound visualization, a 14 gauge spring-loaded device was used to perform biopsy of a mass in the left breast at 3 o'clock, 1 cm from the nipple using a lateral approach. At the conclusion of the procedure a coil shaped tissue marker clip was deployed into the biopsy cavity. Follow up 2 view mammogram was performed and dictated separately. IMPRESSION: Ultrasound guided biopsy of 2 sites in the left breast at 3 o'clock. No apparent complications. Electronically Signed: By: Ammie Ferrier M.D. 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 This was found to be concordant by Dr. Ammie Ferrier. 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 This was found to be concordant by Dr. Ammie Ferrier. Pathology results were discussed with the patient by telephone. The patient reported doing well after the biopsies with tenderness at the sites. Post biopsy instructions and care were reviewed  and questions were answered. The patient was encouraged to call [facility] for any additional concerns. My direct phone number was provided. The patient was referred to [facility] on July 26, 2022. Consideration for a bilateral breast MRI for further evaluation of extent of disease given the fragmented appearance of the masses. Pathology results reported by Terie Purser, RN on 07/19/2022. Electronically Signed   By: Ammie Ferrier M.D.   On: 07/19/2022 17:07   Result Date: 07/19/2022 CLINICAL DATA:  62 year old female presenting for ultrasound-guided biopsy of 2 sites in the left breast. EXAM: ULTRASOUND GUIDED LEFT BREAST CORE NEEDLE BIOPSY COMPARISON:  Previous exam(s). PROCEDURE: I met with the patient and we discussed the procedure of ultrasound-guided biopsy, including benefits and alternatives. We discussed the high likelihood of a successful procedure. We discussed the risks of the procedure, including infection, bleeding, tissue injury, clip migration, and inadequate sampling. Informed written consent was given. The usual time-out protocol was performed immediately prior to the procedure. Lesion quadrant: Upper outer quadrant Using ChloraPrep, sterile technique and 1% Lidocaine as deep local anesthetic, under direct ultrasound visualization, a 14 gauge spring-loaded device was used to perform biopsy of a mass in the left breast at 3 o'clock, 2 cm from the nipple using an inferior approach. At the conclusion of the procedure a ribbon shaped tissue marker clip was deployed into the biopsy cavity. Lesion quadrant: Upper outer quadrant Using sterile technique and 1% Lidocaine as deep local anesthetic, under direct ultrasound visualization, a 14 gauge spring-loaded device was used to perform biopsy of a mass in the left breast at 3 o'clock, 1 cm from the nipple using a lateral approach. At the  conclusion of the procedure a coil shaped tissue marker clip was deployed into the biopsy cavity. Follow up 2 view mammogram was performed and  dictated separately. IMPRESSION: Ultrasound guided biopsy of 2 sites in the left breast at 3 o'clock. No apparent complications. Electronically Signed: By: Ammie Ferrier M.D. On: 07/18/2022 14:58  MM CLIP PLACEMENT LEFT  Result Date: 07/18/2022 CLINICAL DATA:  Post biopsy mammogram of the left breast for clip placement. EXAM: 3D DIAGNOSTIC LEFT MAMMOGRAM POST ULTRASOUND BIOPSY COMPARISON:  Previous exam(s). FINDINGS: 3D Mammographic images were obtained following ultrasound guided biopsy of 2 masses in the left breast. The biopsy marking clips are in expected position at the sites of biopsy. IMPRESSION: 1. Appropriate positioning of the ribbon shaped biopsy marking clip at the site of biopsy in the lateral posterior left breast. 2. Appropriate positioning of the coil shaped biopsy marking clip in the lateral anterior left breast. Final Assessment: Post Procedure Mammograms for Marker Placement Electronically Signed   By: Ammie Ferrier M.D.   On: 07/18/2022 16:03  MM DIAG BREAST TOMO BILATERAL  Addendum Date: 07/14/2022   ADDENDUM REPORT: 07/14/2022 14:44 ADDENDUM: The irregular hypoechoic non-mass area within the LEFT breast at the 3 o'clock axis, presumed correlate for the multiple abutting masses with associated coarse heterogeneous calcifications seen on mammogram in the outer LEFT breast at posterior depth, is labeled as 2 cm from the nipple on the ultrasound images. However, depending on patient positioning/rolling, this finding can be localized up to 4 cm from the nipple. Electronically Signed   By: Franki Cabot M.D.   On: 07/14/2022 14:44   Result Date: 07/14/2022 CLINICAL DATA:  Patient describes pain within the outer LEFT breast and an associated lump. Patient also has had clear nipple discharge in the past. EXAM: DIGITAL DIAGNOSTIC BILATERAL  MAMMOGRAM WITH TOMOSYNTHESIS; ULTRASOUND LEFT BREAST LIMITED TECHNIQUE: Bilateral digital diagnostic mammography and breast tomosynthesis was performed.; Targeted ultrasound examination of the left breast was performed. COMPARISON:  Previous exam(s). ACR Breast Density Category b: There are scattered areas of fibroglandular density. FINDINGS: Multiple new abutting masses are present within the outer LEFT breast, at posterior depth, with associated coarse heterogeneous calcifications, measuring approximately 3.8 x 2.2 cm. There is also a partially obscured mass within the outer LEFT breast, at middle to anterior depth, closer to the nipple, measuring 1 cm to 1.5 cm greatest dimension. There are no new dominant masses, suspicious calcifications or secondary signs of malignancy elsewhere within the LEFT breast. There are no new dominant masses, suspicious calcifications or secondary signs of malignancy within the RIGHT breast. Targeted ultrasound is performed, showing an irregular hypoechoic non-mass area within the LEFT breast at the 3 o'clock axis, 2 cm from the nipple, measuring 3.5 x 2.3 x 2.3 cm, likely corresponding to the multiple abutting masses with associated coarse heterogeneous calcifications seen on mammogram. An additional oval circumscribed hypoechoic mass is identified within the LEFT breast at the 3 o'clock axis, 1 cm from the nipple, measuring 9 x 6 x 6 mm, with adjacent dilated ducts, likely corresponding to the partially obscured mass seen on mammogram closer to the nipple. LEFT axilla was evaluated with ultrasound showing no enlarged or morphologically abnormal lymph nodes. IMPRESSION: 1. Irregular hypoechoic non-mass area within the LEFT breast at the 3 o'clock axis, 2 cm from the nipple, measuring 3.5 x 2.3 x 2.3 cm, likely correlate for the multiple abutting masses with associated coarse heterogeneous calcifications seen on mammogram within the outer LEFT breast at posterior depth.  Ultrasound-guided biopsy is recommended. 2. Additional hypoechoic mass in the LEFT breast at the 3 o'clock axis, 1 cm from the nipple, with internal vascularity,  measuring 9 mm, a likely correlate for the mass seen on mammogram within the outer LEFT breast at middle to anterior depth. Ultrasound-guided biopsy is recommended. 3. No evidence of malignancy within the RIGHT breast. RECOMMENDATION: 1. Ultrasound-guided biopsy for the irregular hypoechoic non-mass area within the LEFT breast at the 3 o'clock axis, labeled 2 cm from the nipple, presumed correlate for the multiple abutting masses with associated coarse heterogeneous calcifications seen on mammogram in the outer LEFT breast at posterior depth. 2. Ultrasound-guided biopsy for the hypoechoic mass in the LEFT breast at the 3 o'clock axis, 1 cm from the nipple, presumed correlate for the mass seen on mammogram within the outer LEFT breast at middle to anterior depth. 3. Attention to the postprocedure mammogram to ensure mammographic and sonographic correspondence for both sites. If either of the mammographic findings described above do not correspond to the sites of ultrasound-guided biopsy, stereotactic biopsy would then be needed. Ultrasound-guided biopsies are scheduled on February 20th. I have discussed the findings and recommendations with the patient. If applicable, a reminder letter will be sent to the patient regarding the next appointment. BI-RADS CATEGORY  4: Suspicious. Electronically Signed: By: Franki Cabot M.D. On: 07/14/2022 14:30  US BREAST LTD UNI LEFT INC AXILLA  Addendum Date: 07/14/2022   ADDENDUM REPORT: 07/14/2022 14:44 ADDENDUM: The irregular hypoechoic non-mass area within the LEFT breast at the 3 o'clock axis, presumed correlate for the multiple abutting masses with associated coarse heterogeneous calcifications seen on mammogram in the outer LEFT breast at posterior depth, is labeled as 2 cm from the nipple on the ultrasound  images. However, depending on patient positioning/rolling, this finding can be localized up to 4 cm from the nipple. Electronically Signed   By: Franki Cabot M.D.   On: 07/14/2022 14:44   Result Date: 07/14/2022 CLINICAL DATA:  Patient describes pain within the outer LEFT breast and an associated lump. Patient also has had clear nipple discharge in the past. EXAM: DIGITAL DIAGNOSTIC BILATERAL MAMMOGRAM WITH TOMOSYNTHESIS; ULTRASOUND LEFT BREAST LIMITED TECHNIQUE: Bilateral digital diagnostic mammography and breast tomosynthesis was performed.; Targeted ultrasound examination of the left breast was performed. COMPARISON:  Previous exam(s). ACR Breast Density Category b: There are scattered areas of fibroglandular density. FINDINGS: Multiple new abutting masses are present within the outer LEFT breast, at posterior depth, with associated coarse heterogeneous calcifications, measuring approximately 3.8 x 2.2 cm. There is also a partially obscured mass within the outer LEFT breast, at middle to anterior depth, closer to the nipple, measuring 1 cm to 1.5 cm greatest dimension. There are no new dominant masses, suspicious calcifications or secondary signs of malignancy elsewhere within the LEFT breast. There are no new dominant masses, suspicious calcifications or secondary signs of malignancy within the RIGHT breast. Targeted ultrasound is performed, showing an irregular hypoechoic non-mass area within the LEFT breast at the 3 o'clock axis, 2 cm from the nipple, measuring 3.5 x 2.3 x 2.3 cm, likely corresponding to the multiple abutting masses with associated coarse heterogeneous calcifications seen on mammogram. An additional oval circumscribed hypoechoic mass is identified within the LEFT breast at the 3 o'clock axis, 1 cm from the nipple, measuring 9 x 6 x 6 mm, with adjacent dilated ducts, likely corresponding to the partially obscured mass seen on mammogram closer to the nipple. LEFT axilla was evaluated with  ultrasound showing no enlarged or morphologically abnormal lymph nodes. IMPRESSION: 1. Irregular hypoechoic non-mass area within the LEFT breast at the 3 o'clock axis, 2 cm from the  nipple, measuring 3.5 x 2.3 x 2.3 cm, likely correlate for the multiple abutting masses with associated coarse heterogeneous calcifications seen on mammogram within the outer LEFT breast at posterior depth. Ultrasound-guided biopsy is recommended. 2. Additional hypoechoic mass in the LEFT breast at the 3 o'clock axis, 1 cm from the nipple, with internal vascularity, measuring 9 mm, a likely correlate for the mass seen on mammogram within the outer LEFT breast at middle to anterior depth. Ultrasound-guided biopsy is recommended. 3. No evidence of malignancy within the RIGHT breast. RECOMMENDATION: 1. Ultrasound-guided biopsy for the irregular hypoechoic non-mass area within the LEFT breast at the 3 o'clock axis, labeled 2 cm from the nipple, presumed correlate for the multiple abutting masses with associated coarse heterogeneous calcifications seen on mammogram in the outer LEFT breast at posterior depth. 2. Ultrasound-guided biopsy for the hypoechoic mass in the LEFT breast at the 3 o'clock axis, 1 cm from the nipple, presumed correlate for the mass seen on mammogram within the outer LEFT breast at middle to anterior depth. 3. Attention to the postprocedure mammogram to ensure mammographic and sonographic correspondence for both sites. If either of the mammographic findings described above do not correspond to the sites of ultrasound-guided biopsy, stereotactic biopsy would then be needed. Ultrasound-guided biopsies are scheduled on February 20th. I have discussed the findings and recommendations with the patient. If applicable, a reminder letter will be sent to the patient regarding the next appointment. BI-RADS CATEGORY  4: Suspicious. Electronically Signed: By: Franki Cabot M.D. On: 07/14/2022 14:30    Assessment & Plan:   Colon  cancer screening - Cologuard  2. Essential hypertension  - Comprehensive metabolic panel  3. Moderate persistent asthma, unspecified whether complicated  - fluticasone-salmeterol (ADVAIR DISKUS) 250-50 MCG/ACT AEPB; Inhale 1 puff into the lungs in the morning and at bedtime.  Dispense: 60 each; Refill: 0  4. Preop examination  - EKG 12-Lead  5. Ductal carcinoma in situ (DCIS) of left breast  Identified as low risk of surgical complications. Major Pulmonary risks identified in the multifactorial risk analysis are but not limited to a) pneumonia; b) recurrent intubation risk; c) prolonged or recurrent acute respiratory failure needing mechanical ventilation; d) prolonged hospitalization; e) DVT/Pulmonary embolism; f) Acute Pulmonary edema  Recommend 1. Short duration of surgery as much as possible and avoid paralytic if possible 2. Recovery in step down or ICU with Pulmonary consultation if needed 3. DVT prophylaxis as indicated 4. Aggressive pulmonary toilet with O2, bronchodilatation, and incentive spirometry and early ambulation  Follow up:  Follow up in 3 months     Fenton Foy, NP 08/03/2022

## 2022-08-03 NOTE — Patient Instructions (Addendum)
1. Colon cancer screening  - Cologuard  2. Essential hypertension  - Comprehensive metabolic panel  3. Moderate persistent asthma, unspecified whether complicated  - fluticasone-salmeterol (ADVAIR DISKUS) 250-50 MCG/ACT AEPB; Inhale 1 puff into the lungs in the morning and at bedtime.  Dispense: 60 each; Refill: 0  4. Preop examination  - EKG 12-Lead  5. Ductal carcinoma in situ (DCIS) of left breast  Identified as low risk of surgical complications. Major Pulmonary risks identified in the multifactorial risk analysis are but not limited to a) pneumonia; b) recurrent intubation risk; c) prolonged or recurrent acute respiratory failure needing mechanical ventilation; d) prolonged hospitalization; e) DVT/Pulmonary embolism; f) Acute Pulmonary edema  Recommend 1. Short duration of surgery as much as possible and avoid paralytic if possible 2. Recovery in step down or ICU with Pulmonary consultation if needed 3. DVT prophylaxis as indicated 4. Aggressive pulmonary toilet with O2, bronchodilatation, and incentive spirometry and early ambulation  Follow up:  Follow up in 3 months

## 2022-08-03 NOTE — Progress Notes (Signed)
HPI:   Ms. Kovalcik was previously seen in the [facility] due to a personal and family history of breast cancer and concerns regarding a hereditary predisposition to cancer. Please refer to our prior cancer genetics clinic note for more information regarding our discussion, assessment and recommendations, at the time. Ms. Mckiver recent genetic test results were disclosed to her, as were recommendations warranted by these results. These results and recommendations are discussed in more detail below.  CANCER HISTORY:  Oncology History  Ductal carcinoma in situ (DCIS) of left breast  07/24/2022 Initial Diagnosis   Ductal carcinoma in situ (DCIS) of left breast   08/02/2022 Genetic Testing   Negative [facility] Common Hereditary Cancers +RNA Panel.  Report date is 08/03/2022.    The [facility] Common Hereditary Cancers + RNA Panel includes sequencing, deletion/duplication, and RNA analysis of the following 48 genes: APC, ATM, AXIN2, BAP1, BARD1, BMPR1A, BRCA1, BRCA2, BRIP1, CDH1, CDK4*, CDKN2A*, CHEK2, CTNNA1, DICER1, EPCAM* (del/dup only), FH, GREM1* (promoter dup analysis only), HOXB13*, KIT*, MBD4*, MEN1, MLH1, MSH2, MSH3, MSH6, MUTYH, NF1, NTHL1, PALB2, PDGFRA*, PMS2, POLD1, POLE, PTEN, RAD51C, RAD51D, SDHA (sequencing only), SDHB, SDHC, SDHD, SMAD4, SMARCA4, STK11, TP53, TSC1, TSC2, VHL.  *Genes without RNA analysis.      FAMILY HISTORY:  We obtained a detailed, 4-generation family history.  Significant diagnoses are listed below:      Family History  Problem Relation Age of Onset   Breast cancer Mother 36        d. 63   Prostate cancer Father          dx 1s   Bone cancer Paternal Aunt 74        or other primary?   Cancer Paternal Aunt          appendix; early stage; dx after 58   Cancer Paternal Aunt          unknown type; dx 38s; ? early stage/minimal treatment       Ms. Reader is unaware of previous family history of genetic testing for hereditary cancer risks.  There is no reported Ashkenazi Jewish ancestry. There is no known consanguinity.    GENETIC TEST RESULTS:  The [facility] Common Hereditary Cancers +RNA Panel found no pathogenic mutations.    The [facility] Common Hereditary Cancers + RNA Panel includes sequencing, deletion/duplication, and RNA analysis of the following 48 genes: APC, ATM, AXIN2, BAP1, BARD1, BMPR1A, BRCA1, BRCA2, BRIP1, CDH1, CDK4*, CDKN2A*, CHEK2, CTNNA1, DICER1, EPCAM* (del/dup only), FH, GREM1* (promoter dup analysis only), HOXB13*, KIT*, MBD4*, MEN1, MLH1, MSH2, MSH3, MSH6, MUTYH, NF1, NTHL1, PALB2, PDGFRA*, PMS2, POLD1, POLE, PTEN, RAD51C, RAD51D, SDHA (sequencing only), SDHB, SDHC, SDHD, SMAD4, SMARCA4, STK11, TP53, TSC1, TSC2, VHL.  *Genes without RNA analysis.  .   The test report has been scanned into EPIC and is located under the Molecular Pathology section of the Results Review tab.  A portion of the result report is included below for reference. Genetic testing reported out on August 03, 2022.      Even though a pathogenic variant was not identified, possible explanations for the cancer in the family may include: There may be no hereditary risk for cancer in the family. The cancers in Ms. Adinolfi and/or her family may be sporadic/familial or due to other genetic and environmental factors. There may be a gene mutation in one of these genes that current testing methods cannot detect but that chance is small. There could be another gene that has not yet  been discovered, or that we have not yet tested, that is responsible for the cancer diagnoses in the family.  It is also possible there is a hereditary cause for the cancer in the family that Ms. Chuang did not inherit.  Therefore, it is important to remain in touch with cancer genetics in the future so that we can continue to offer Ms. Bortner the most up to date genetic testing.     ADDITIONAL GENETIC TESTING:  We discussed with Ms. Lanasa that her genetic testing was  fairly extensive.  If there are additional relevant genes identified to increase cancer risk that can be analyzed in the future, we would be happy to discuss and coordinate this testing at that time.      CANCER SCREENING RECOMMENDATIONS:  Ms. Eisenstein test result is considered negative (normal).  This means that we have not identified a hereditary cause for her personal history of DCIS at this time.   RECOMMENDATIONS FOR FAMILY MEMBERS:   Since she did not inherit a identifiable mutation in a cancer predisposition gene included on this panel, her children could not have inherited a known mutation from her in one of these genes. Individuals in this family might be at some increased risk of developing cancer, over the general population risk, due to the family history of cancer.  Individuals in the family should notify their providers of the family history of cancer. We recommend women in this family have a yearly mammogram beginning at age 28, or 65 years younger than the earliest onset of cancer, an annual clinical breast exam, and perform monthly breast self-exams.  Female relatives should speak with their providers about prostate cancer screening.  Other members of the family may still carry a pathogenic variant in one of these genes that Ms. Norfolk did not inherit. Based on the family history of breast cancer in her mother, we recommend her siblings have genetic counseling and testing. Ms. Ortloff will let us know if we can be of any assistance in coordinating genetic counseling and/or testing for these family members.     FOLLOW-UP:  Lastly, we discussed with Ms. Jarriel that cancer genetics is a rapidly advancing field and it is possible that new genetic tests will be appropriate for her and/or her family members in the future. We encouraged her to remain in contact with cancer genetics on an annual basis so we can update her personal and family histories and let her know of advances in cancer  genetics that may benefit this family.   Our contact number was provided. Ms. Wisman questions were answered to her satisfaction, and she knows she is welcome to call us at anytime with additional questions or concerns.   Lashaye Fisk M. Joette Catching, [place], [facility] Genetic Counselor [email or web address] (P) [phone]

## 2022-08-03 NOTE — Telephone Encounter (Addendum)
Revealed negative genetics (STAT and Pan-Cancer Panel).

## 2022-08-03 NOTE — Assessment & Plan Note (Signed)
-   Cologuard  2. Essential hypertension  - Comprehensive metabolic panel  3. Moderate persistent asthma, unspecified whether complicated  - fluticasone-salmeterol (ADVAIR DISKUS) 250-50 MCG/ACT AEPB; Inhale 1 puff into the lungs in the morning and at bedtime.  Dispense: 60 each; Refill: 0  4. Preop examination  - EKG 12-Lead  5. Ductal carcinoma in situ (DCIS) of left breast  Identified as low risk of surgical complications. Major Pulmonary risks identified in the multifactorial risk analysis are but not limited to a) pneumonia; b) recurrent intubation risk; c) prolonged or recurrent acute respiratory failure needing mechanical ventilation; d) prolonged hospitalization; e) DVT/Pulmonary embolism; f) Acute Pulmonary edema  Recommend 1. Short duration of surgery as much as possible and avoid paralytic if possible 2. Recovery in step down or ICU with Pulmonary consultation if needed 3. DVT prophylaxis as indicated 4. Aggressive pulmonary toilet with O2, bronchodilatation, and incentive spirometry and early ambulation  Follow up:  Follow up in 3 months

## 2022-08-04 LAB — COMPREHENSIVE METABOLIC PANEL
ALT: 13 IU/L (ref 0–32)
AST: 12 IU/L (ref 0–40)
Albumin/Globulin Ratio: 1.5 (ref 1.2–2.2)
Albumin: 4.5 g/dL (ref 3.9–4.9)
Alkaline Phosphatase: 118 IU/L (ref 44–121)
BUN/Creatinine Ratio: 9 — ABNORMAL LOW (ref 12–28)
BUN: 8 mg/dL (ref 8–27)
Bilirubin Total: 0.3 mg/dL (ref 0.0–1.2)
CO2: 25 mmol/L (ref 20–29)
Calcium: 10.4 mg/dL — ABNORMAL HIGH (ref 8.7–10.3)
Chloride: 97 mmol/L (ref 96–106)
Creatinine, Ser: 0.86 mg/dL (ref 0.57–1.00)
Globulin, Total: 3 g/dL (ref 1.5–4.5)
Glucose: 139 mg/dL — ABNORMAL HIGH (ref 70–99)
Potassium: 3.7 mmol/L (ref 3.5–5.2)
Sodium: 140 mmol/L (ref 134–144)
Total Protein: 7.5 g/dL (ref 6.0–8.5)
eGFR: 76 mL/min/{1.73_m2} (ref >59)

## 2022-08-07 NOTE — Progress Notes (Signed)
Called pt and left a message for return call. [place]

## 2022-08-08 ENCOUNTER — Telehealth: Payer: Self-pay | Admitting: Nurse Practitioner

## 2022-08-08 NOTE — Telephone Encounter (Signed)
Patient LVM on nurse line 08/07/22 requesting a callback regarding her test results. [phone]

## 2022-08-11 ENCOUNTER — Other Ambulatory Visit: Payer: Self-pay

## 2022-08-11 ENCOUNTER — Encounter (HOSPITAL_BASED_OUTPATIENT_CLINIC_OR_DEPARTMENT_OTHER): Payer: Self-pay | Admitting: Surgery

## 2022-08-14 ENCOUNTER — Encounter (HOSPITAL_BASED_OUTPATIENT_CLINIC_OR_DEPARTMENT_OTHER)
Admission: RE | Admit: 2022-08-14 | Discharge: 2022-08-14 | Disposition: A | Discharge status: Home / Self Care | Payer: 59 | Source: Ambulatory Visit | Attending: Surgery | Admitting: Surgery

## 2022-08-14 ENCOUNTER — Other Ambulatory Visit: Payer: Self-pay

## 2022-08-14 DIAGNOSIS — Z01810 Encounter for preprocedural cardiovascular examination: Secondary | ICD-10-CM | POA: Diagnosis present

## 2022-08-14 DIAGNOSIS — I10 Essential (primary) hypertension: Secondary | ICD-10-CM | POA: Diagnosis not present

## 2022-08-14 NOTE — Progress Notes (Signed)

## 2022-08-15 ENCOUNTER — Ambulatory Visit
Admission: RE | Admit: 2022-08-15 | Discharge: 2022-08-15 | Disposition: A | Discharge status: Home / Self Care | Payer: Commercial Managed Care - HMO | Source: Ambulatory Visit | Attending: Surgery | Admitting: Surgery

## 2022-08-15 DIAGNOSIS — D0512 Intraductal carcinoma in situ of left breast: Secondary | ICD-10-CM

## 2022-08-15 HISTORY — PX: BREAST BIOPSY: SHX20

## 2022-08-16 ENCOUNTER — Other Ambulatory Visit (HOSPITAL_COMMUNITY): Payer: Self-pay

## 2022-08-16 MED ORDER — OXYCODONE HCL 5 MG PO TABS
5.0000 mg | ORAL_TABLET | ORAL | 0 refills | Status: DC | PRN
  Filled 2022-08-16: qty 15, 3d supply, fill #0

## 2022-08-17 NOTE — Anesthesia Preprocedure Evaluation (Addendum)
Anesthesia Evaluation  Patient identified by MRN, date of birth, ID band Patient awake    Reviewed: Allergy & Precautions, NPO status , Patient's Chart, lab work & pertinent test results  Airway Mallampati: II  TM Distance: >3 FB Neck ROM: Full    Dental no notable dental hx.    Pulmonary asthma    Pulmonary exam normal        Cardiovascular hypertension,  Rhythm:Regular Rate:Normal     Neuro/Psych CVA  negative psych ROS   GI/Hepatic Neg liver ROS,GERD  ,,  Endo/Other  negative endocrine ROS    Renal/GU negative Renal ROS  negative genitourinary   Musculoskeletal Left breast DCIS   Abdominal Normal abdominal exam  (+)   Peds  Hematology negative hematology ROS (+)   Anesthesia Other Findings   Reproductive/Obstetrics                             Anesthesia Physical Anesthesia Plan  ASA: 2  Anesthesia Plan: General   Post-op Pain Management: Celebrex PO (pre-op)* and Tylenol PO (pre-op)*   Induction: Intravenous  PONV Risk Score and Plan: 3 and Ondansetron, Dexamethasone, Treatment may vary due to age or medical condition and Midazolam  Airway Management Planned: Mask and LMA  Additional Equipment: None  Intra-op Plan:   Post-operative Plan: Extubation in OR  Informed Consent: I have reviewed the patients History and Physical, chart, labs and discussed the procedure including the risks, benefits and alternatives for the proposed anesthesia with the patient or authorized representative who has indicated his/her understanding and acceptance.     Dental advisory given  Plan Discussed with: CRNA  Anesthesia Plan Comments:        Anesthesia Quick Evaluation

## 2022-08-18 ENCOUNTER — Encounter (HOSPITAL_BASED_OUTPATIENT_CLINIC_OR_DEPARTMENT_OTHER)
Admission: RE | Disposition: A | Discharge status: Home / Self Care | Payer: Self-pay | Source: Home / Self Care | Attending: Surgery

## 2022-08-18 ENCOUNTER — Other Ambulatory Visit: Payer: Self-pay

## 2022-08-18 ENCOUNTER — Ambulatory Visit
Admission: RE | Admit: 2022-08-18 | Discharge: 2022-08-18 | Disposition: A | Discharge status: Home / Self Care | Payer: Commercial Managed Care - HMO | Source: Ambulatory Visit | Attending: Surgery | Admitting: Surgery

## 2022-08-18 ENCOUNTER — Ambulatory Visit (HOSPITAL_BASED_OUTPATIENT_CLINIC_OR_DEPARTMENT_OTHER): Payer: Medicare Other | Admitting: Anesthesiology

## 2022-08-18 ENCOUNTER — Ambulatory Visit (HOSPITAL_BASED_OUTPATIENT_CLINIC_OR_DEPARTMENT_OTHER)
Admission: RE | Admit: 2022-08-18 | Discharge: 2022-08-18 | Disposition: A | Discharge status: Home / Self Care | Payer: Medicare Other | Attending: Surgery | Admitting: Surgery

## 2022-08-18 ENCOUNTER — Encounter (HOSPITAL_BASED_OUTPATIENT_CLINIC_OR_DEPARTMENT_OTHER): Payer: Self-pay | Admitting: Surgery

## 2022-08-18 DIAGNOSIS — D0512 Intraductal carcinoma in situ of left breast: Secondary | ICD-10-CM

## 2022-08-18 DIAGNOSIS — Z8042 Family history of malignant neoplasm of prostate: Secondary | ICD-10-CM | POA: Insufficient documentation

## 2022-08-18 DIAGNOSIS — I10 Essential (primary) hypertension: Secondary | ICD-10-CM

## 2022-08-18 DIAGNOSIS — Z803 Family history of malignant neoplasm of breast: Secondary | ICD-10-CM | POA: Insufficient documentation

## 2022-08-18 HISTORY — PX: BREAST LUMPECTOMY WITH RADIOACTIVE SEED LOCALIZATION: SHX6424

## 2022-08-18 SURGERY — BREAST LUMPECTOMY WITH RADIOACTIVE SEED LOCALIZATION
Anesthesia: General | Site: Breast | Laterality: Left

## 2022-08-18 MED ORDER — FENTANYL CITRATE (PF) 100 MCG/2ML IJ SOLN: 25.0000 ug | INTRAMUSCULAR | Status: DC | PRN

## 2022-08-18 MED ORDER — FENTANYL CITRATE (PF) 100 MCG/2ML IJ SOLN
INTRAMUSCULAR | Status: AC
  Filled 2022-08-18: qty 2

## 2022-08-18 MED ORDER — ONDANSETRON HCL 4 MG/2ML IJ SOLN
INTRAMUSCULAR | Status: AC
  Filled 2022-08-18: qty 2

## 2022-08-18 MED ORDER — CEFAZOLIN IN SODIUM CHLORIDE 3-0.9 GM/100ML-% IV SOLN: 3.0000 g | INTRAVENOUS | Status: DC

## 2022-08-18 MED ORDER — PROPOFOL 10 MG/ML IV BOLUS
INTRAVENOUS | Status: AC
  Filled 2022-08-18: qty 20

## 2022-08-18 MED ORDER — CHLORHEXIDINE GLUCONATE CLOTH 2 % EX PADS: 6.0000 | MEDICATED_PAD | Freq: Once | CUTANEOUS | Status: DC

## 2022-08-18 MED ORDER — ONDANSETRON HCL 4 MG/2ML IJ SOLN
INTRAMUSCULAR | Status: DC | PRN
  Administered 2022-08-18: 4 mg via INTRAVENOUS

## 2022-08-18 MED ORDER — LIDOCAINE 2% (20 MG/ML) 5 ML SYRINGE
INTRAMUSCULAR | Status: AC
  Filled 2022-08-18: qty 5

## 2022-08-18 MED ORDER — ALBUTEROL SULFATE HFA 108 (90 BASE) MCG/ACT IN AERS
INHALATION_SPRAY | RESPIRATORY_TRACT | Status: DC | PRN
  Administered 2022-08-18: 4 via RESPIRATORY_TRACT

## 2022-08-18 MED ORDER — OXYCODONE HCL 5 MG PO TABS
5.0000 mg | ORAL_TABLET | Freq: Once | ORAL | Status: AC | PRN
  Administered 2022-08-18: 5 mg via ORAL

## 2022-08-18 MED ORDER — IBUPROFEN 800 MG PO TABS
800.0000 mg | ORAL_TABLET | Freq: Three times a day (TID) | ORAL | 0 refills | Status: DC | PRN
  Filled 2022-08-18: qty 30, 10d supply, fill #0

## 2022-08-18 MED ORDER — ACETAMINOPHEN 500 MG PO TABS
1000.0000 mg | ORAL_TABLET | Freq: Once | ORAL | Status: AC
  Administered 2022-08-18: 1000 mg via ORAL

## 2022-08-18 MED ORDER — DEXMEDETOMIDINE HCL IN NACL 200 MCG/50ML IV SOLN
INTRAVENOUS | Status: DC | PRN
  Administered 2022-08-18: 12 ug via INTRAVENOUS

## 2022-08-18 MED ORDER — CEFAZOLIN SODIUM-DEXTROSE 2-4 GM/100ML-% IV SOLN
INTRAVENOUS | Status: AC
  Filled 2022-08-18: qty 100

## 2022-08-18 MED ORDER — LIDOCAINE HCL (CARDIAC) PF 100 MG/5ML IV SOSY
PREFILLED_SYRINGE | INTRAVENOUS | Status: DC | PRN
  Administered 2022-08-18: 80 mg via INTRAVENOUS

## 2022-08-18 MED ORDER — BUPIVACAINE HCL (PF) 0.25 % IJ SOLN
INTRAMUSCULAR | Status: DC | PRN
  Administered 2022-08-18: 26 mL

## 2022-08-18 MED ORDER — DEXAMETHASONE SODIUM PHOSPHATE 10 MG/ML IJ SOLN
INTRAMUSCULAR | Status: AC
  Filled 2022-08-18: qty 1

## 2022-08-18 MED ORDER — OXYCODONE HCL 5 MG/5ML PO SOLN: 5.0000 mg | Freq: Once | ORAL | Status: AC | PRN

## 2022-08-18 MED ORDER — LACTATED RINGERS IV SOLN: INTRAVENOUS | Status: DC | PRN

## 2022-08-18 MED ORDER — OXYCODONE HCL 5 MG PO TABS
ORAL_TABLET | ORAL | Status: AC
  Filled 2022-08-18: qty 1

## 2022-08-18 MED ORDER — ALBUTEROL SULFATE HFA 108 (90 BASE) MCG/ACT IN AERS
INHALATION_SPRAY | RESPIRATORY_TRACT | Status: AC
  Filled 2022-08-18: qty 6.7

## 2022-08-18 MED ORDER — PROPOFOL 10 MG/ML IV BOLUS
INTRAVENOUS | Status: DC | PRN
  Administered 2022-08-18: 150 mg via INTRAVENOUS

## 2022-08-18 MED ORDER — ACETAMINOPHEN 500 MG PO TABS: 1000.0000 mg | ORAL_TABLET | ORAL | Status: AC

## 2022-08-18 MED ORDER — DEXAMETHASONE SODIUM PHOSPHATE 10 MG/ML IJ SOLN
INTRAMUSCULAR | Status: DC | PRN
  Administered 2022-08-18: 5 mg via INTRAVENOUS

## 2022-08-18 MED ORDER — SODIUM CHLORIDE 0.9 % IV SOLN
INTRAVENOUS | Status: AC
  Filled 2022-08-18 (×2): qty 10

## 2022-08-18 MED ORDER — LACTATED RINGERS IV SOLN: INTRAVENOUS | Status: DC

## 2022-08-18 MED ORDER — PROPOFOL 500 MG/50ML IV EMUL
INTRAVENOUS | Status: DC | PRN
  Administered 2022-08-18: 35 ug/kg/min via INTRAVENOUS

## 2022-08-18 MED ORDER — ACETAMINOPHEN 500 MG PO TABS
ORAL_TABLET | ORAL | Status: AC
  Filled 2022-08-18: qty 2

## 2022-08-18 MED ORDER — FENTANYL CITRATE (PF) 100 MCG/2ML IJ SOLN
INTRAMUSCULAR | Status: DC | PRN
  Administered 2022-08-18: 100 ug via INTRAVENOUS
  Administered 2022-08-18: 50 ug via INTRAVENOUS

## 2022-08-18 MED ORDER — CEFAZOLIN SODIUM-DEXTROSE 2-3 GM-%(50ML) IV SOLR
INTRAVENOUS | Status: DC | PRN
  Administered 2022-08-18: 2 g via INTRAVENOUS

## 2022-08-18 MED ORDER — BUPIVACAINE HCL (PF) 0.25 % IJ SOLN
INTRAMUSCULAR | Status: AC
  Filled 2022-08-18: qty 90

## 2022-08-18 MED ORDER — SODIUM CHLORIDE 0.9 % IV SOLN
INTRAVENOUS | Status: DC | PRN
  Administered 2022-08-18: 500 mL

## 2022-08-18 MED ORDER — HYDRALAZINE HCL 20 MG/ML IJ SOLN
INTRAMUSCULAR | Status: DC | PRN
  Administered 2022-08-18: 5 mg via INTRAVENOUS

## 2022-08-18 SURGICAL SUPPLY — 52 items
ADH SKN CLS APL DERMABOND .7 (GAUZE/BANDAGES/DRESSINGS) ×1
APL PRP STRL LF DISP 70% ISPRP (MISCELLANEOUS) ×1
APPLIER CLIP 9.375 MED OPEN (MISCELLANEOUS) ×1
APR CLP MED 9.3 20 MLT OPN (MISCELLANEOUS) ×1
BINDER BREAST LRG (GAUZE/BANDAGES/DRESSINGS) IMPLANT
BINDER BREAST MEDIUM (GAUZE/BANDAGES/DRESSINGS) IMPLANT
BINDER BREAST XLRG (GAUZE/BANDAGES/DRESSINGS) IMPLANT
BINDER BREAST XXLRG (GAUZE/BANDAGES/DRESSINGS) IMPLANT
BLADE SURG 15 STRL LF DISP TIS (BLADE) ×2 IMPLANT
BLADE SURG 15 STRL SS (BLADE) ×2
CANISTER SUC SOCK COL 7IN (MISCELLANEOUS) IMPLANT
CANISTER SUCT 1200ML W/VALVE (MISCELLANEOUS) IMPLANT
CHLORAPREP W/TINT 26 (MISCELLANEOUS) ×2 IMPLANT
CLIP APPLIE 9.375 MED OPEN (MISCELLANEOUS) IMPLANT
COVER BACK TABLE 60X90IN (DRAPES) ×2 IMPLANT
COVER MAYO STAND STRL (DRAPES) ×2 IMPLANT
COVER PROBE CYLINDRICAL 5X96 (MISCELLANEOUS) ×2 IMPLANT
DERMABOND ADVANCED .7 DNX12 (GAUZE/BANDAGES/DRESSINGS) ×2 IMPLANT
DRAPE LAPAROSCOPIC ABDOMINAL (DRAPES) IMPLANT
DRAPE LAPAROTOMY 100X72 PEDS (DRAPES) ×2 IMPLANT
DRAPE UTILITY XL STRL (DRAPES) ×2 IMPLANT
ELECT COATED BLADE 2.86 ST (ELECTRODE) ×2 IMPLANT
ELECT REM PT RETURN 9FT ADLT (ELECTROSURGICAL) ×1
ELECTRODE REM PT RTRN 9FT ADLT (ELECTROSURGICAL) ×2 IMPLANT
GLOVE BIOGEL PI IND STRL 7.0 (GLOVE) IMPLANT
GLOVE BIOGEL PI IND STRL 7.5 (GLOVE) IMPLANT
GLOVE BIOGEL PI IND STRL 8 (GLOVE) ×2 IMPLANT
GLOVE ECLIPSE 8.0 STRL XLNG CF (GLOVE) ×2 IMPLANT
GLOVE SURG SS PI 7.0 STRL IVOR (GLOVE) IMPLANT
GOWN STRL REUS W/ TWL LRG LVL3 (GOWN DISPOSABLE) ×4 IMPLANT
GOWN STRL REUS W/ TWL XL LVL3 (GOWN DISPOSABLE) ×2 IMPLANT
GOWN STRL REUS W/TWL LRG LVL3 (GOWN DISPOSABLE) ×2
GOWN STRL REUS W/TWL XL LVL3 (GOWN DISPOSABLE) ×1
HEMOSTAT ARISTA ABSORB 3G PWDR (HEMOSTASIS) IMPLANT
HEMOSTAT SNOW SURGICEL 2X4 (HEMOSTASIS) IMPLANT
KIT MARKER MARGIN INK (KITS) ×2 IMPLANT
NDL HYPO 25X1 1.5 SAFETY (NEEDLE) ×2 IMPLANT
NEEDLE HYPO 25X1 1.5 SAFETY (NEEDLE) ×1 IMPLANT
NS IRRIG 1000ML POUR BTL (IV SOLUTION) ×2 IMPLANT
PACK BASIN DAY SURGERY FS (CUSTOM PROCEDURE TRAY) ×2 IMPLANT
PENCIL SMOKE EVACUATOR (MISCELLANEOUS) ×2 IMPLANT
SLEEVE SCD COMPRESS KNEE MED (STOCKING) ×2 IMPLANT
SPIKE FLUID TRANSFER (MISCELLANEOUS) IMPLANT
SPONGE T-LAP 4X18 ~~LOC~~+RFID (SPONGE) ×2 IMPLANT
SUT MNCRL AB 4-0 PS2 18 (SUTURE) ×2 IMPLANT
SUT SILK 2 0 SH (SUTURE) IMPLANT
SUT VICRYL 3-0 CR8 SH (SUTURE) ×2 IMPLANT
SYR CONTROL 10ML LL (SYRINGE) ×2 IMPLANT
TOWEL GREEN STERILE FF (TOWEL DISPOSABLE) ×2 IMPLANT
TRAY FAXITRON CT DISP (TRAY / TRAY PROCEDURE) ×2 IMPLANT
TUBE CONNECTING 20X1/4 (TUBING) IMPLANT
YANKAUER SUCT BULB TIP NO VENT (SUCTIONS) IMPLANT

## 2022-08-18 NOTE — Anesthesia Postprocedure Evaluation (Signed)
Anesthesia Post Note  Patient: Heidi Tran  Procedure(s) Performed: LEFT BREAST BRACKETED LUMPECTOMY WITH RADIOACTIVE SEED LOCALIZATION (Left: Breast)     Patient location during evaluation: PACU Anesthesia Type: General Level of consciousness: awake and alert Pain management: pain level controlled Vital Signs Assessment: post-procedure vital signs reviewed and stable Respiratory status: spontaneous breathing, nonlabored ventilation, respiratory function stable and patient connected to nasal cannula oxygen Cardiovascular status: blood pressure returned to baseline and stable Postop Assessment: no apparent nausea or vomiting Anesthetic complications: no   No notable events documented.  Last Vitals:  Vitals:   08/18/22 0900 08/18/22 0910  BP: 138/78 139/79  Pulse: 87 86  Resp: 12 16  Temp:  (!) 36.3 C  SpO2: 96% 95%    Last Pain:  Vitals:   08/18/22 0925  TempSrc:   PainSc: 4                  Dagon Budai P Mykenzie Ebanks

## 2022-08-18 NOTE — Op Note (Signed)
Preoperative diagnosis: Left breast DCIS upper outer quadrant  Postoperative diagnosis: Same  Procedure: Left breast seed localized lumpectomy using a bracketed approach with 2 seeds  Surgeon: Erroll Luna, MD  Anesthesia: LMA with 0.25% Marcaine plain  EBL: Minimal  Specimen: Left breast tissue with 2 clips and both seeds removed  Drains: None  Indications for procedure: The patient is a 62 year old female with left breast DCIS.  She was seen and evaluated in a multidisciplinary setting.  She opted for breast conserving surgery after reviewing all of her surgical options of lumpectomy versus mastectomy with reconstruction.The procedure has been discussed with the patient. Alternatives to surgery have been discussed with the patient.  Risks of surgery include bleeding,  Infection,  Seroma formation, death,  and the need for further surgery.   The patient understands and wishes to proceed.     Description of procedure: The patient was met in the holding area.  The left breast was marked as the correct site.  Of note she underwent seed placement as an outpatient with the radiology department.  Films were available for review.  She was brought to the operating room.  She was placed supine upon the operating room table.  After induction of general anesthesia, left breast was prepped and draped in sterile fashion and a timeout was performed.  Proper patient, site and procedure were verified.  Neoprobe was used to identify both seeds and left upper outer quadrant.  A curvilinear incision was made between both seeds.  Dissection was carried more medially and then more laterally to encompass the area of both seeds and both clips.  While excising the more medial seed, the seed was encountered and removed to prevent loss.  A stitch was placed there though to orient.  We then excised the tissue in a medial to lateral fashion to encompass the more lateral seed.  The tissue was then oriented after removal  and the Faxitron image revealed both clips to be present as well as a more lateral seed.  Hemostasis achieved with cautery.  Irrigation used.  Local anesthetic infiltrated throughout the cavity.  Clips were placed to mark it.  The deep tissue layers were closed with 3-0 Vicryl.  4-0 Monocryl was used to close the skin in a subcuticular fashion.  Dermabond was applied.  Breast binder placed.  All counts found to be correct.  The patient was then awoke extubated taken to recovery in satisfactory condition.

## 2022-08-18 NOTE — Discharge Instructions (Addendum)
[facility] Office Phone Number [phone]  BREAST BIOPSY/ PARTIAL MASTECTOMY: POST OP INSTRUCTIONS  Always review your discharge instruction sheet given to you by the facility where your surgery was performed.  IF YOU HAVE DISABILITY OR FAMILY LEAVE FORMS, YOU MUST BRING THEM TO THE OFFICE FOR PROCESSING.  DO NOT GIVE THEM TO YOUR DOCTOR.  A prescription for pain medication may be given to you upon discharge.  Take your pain medication as prescribed, if needed.  If narcotic pain medicine is not needed, then you may take acetaminophen (Tylenol) or ibuprofen (Advil) as needed. Take your usually prescribed medications unless otherwise directed If you need a refill on your pain medication, please contact your pharmacy.  They will contact our office to request authorization.  Prescriptions will not be filled after 5pm or on week-ends. You should eat very light the first 24 hours after surgery, such as soup, crackers, pudding, etc.  Resume your normal diet the day after surgery. Most patients will experience some swelling and bruising in the breast.  Ice packs and a good support bra will help.  Swelling and bruising can take several days to resolve.  It is common to experience some constipation if taking pain medication after surgery.  Increasing fluid intake and taking a stool softener will usually help or prevent this problem from occurring.  A mild laxative (Milk of Magnesia or Miralax) should be taken according to package directions if there are no bowel movements after 48 hours. Unless discharge instructions indicate otherwise, you may remove your bandages 24-48 hours after surgery, and you may shower at that time.  You may have steri-strips (small skin tapes) in place directly over the incision.  These strips should be left on the skin for 7-10 days.  If your surgeon used skin glue on the incision, you may shower in 24 hours.  The glue will flake off over the next 2-3 weeks.  Any  sutures or staples will be removed at the office during your follow-up visit. ACTIVITIES:  You may resume regular daily activities (gradually increasing) beginning the next day.  Wearing a good support bra or sports bra minimizes pain and swelling.  You may have sexual intercourse when it is comfortable. You may drive when you no longer are taking prescription pain medication, you can comfortably wear a seatbelt, and you can safely maneuver your car and apply brakes. RETURN TO WORK:  ______________________________________________________________________________________ You should see your doctor in the office for a follow-up appointment approximately two weeks after your surgery.  Your doctor's nurse will typically make your follow-up appointment when she calls you with your pathology report.  Expect your pathology report 2-3 business days after your surgery.  You may call to check if you do not hear from us after three days. OTHER INSTRUCTIONS: _______________________________________________________________________________________________ _____________________________________________________________________________________________________________________________________ _____________________________________________________________________________________________________________________________________ _____________________________________________________________________________________________________________________________________  WHEN TO CALL YOUR DOCTOR: Fever over 101.0 Nausea and/or vomiting. Extreme swelling or bruising. Continued bleeding from incision. Increased pain, redness, or drainage from the incision.  The clinic staff is available to answer your questions during regular business hours.  Please don't hesitate to call and ask to speak to one of the nurses for clinical concerns.  If you have a medical emergency, go to the nearest emergency room or call 911.  A surgeon from [facility] is always on call at the hospital.  For further questions, please visit [email or web address]    Post Anesthesia Home Care Instructions  Activity: Get plenty of rest for the remainder of   of the day. A responsible individual must stay with you for 24 hours following the procedure.  For the next 24 hours, DO NOT: -Drive a car -Paediatric nurse -Drink alcoholic beverages -Take any medication unless instructed by your physician -Make any legal decisions or sign important papers.  Meals: Start with liquid foods such as gelatin or soup. Progress to regular foods as tolerated. Avoid greasy, spicy, heavy foods. If nausea and/or vomiting occur, drink only clear liquids until the nausea and/or vomiting subsides. Call your physician if vomiting continues.  Special Instructions/Symptoms: Your throat may feel dry or sore from the anesthesia or the breathing tube placed in your throat during surgery. If this causes discomfort, gargle with warm salt water. The discomfort should disappear within 24 hours.  If you had a scopolamine patch placed behind your ear for the management of post- operative nausea and/or vomiting:  1. The medication in the patch is effective for 72 hours, after which it should be removed.  Wrap patch in a tissue and discard in the trash. Wash hands thoroughly with soap and water. 2. You may remove the patch earlier than 72 hours if you experience unpleasant side effects which may include dry mouth, dizziness or visual disturbances. 3. Avoid touching the patch. Wash your hands with soap and water after contact with the patch.  No tylenol until after 11:00AM today

## 2022-08-18 NOTE — Interval H&P Note (Signed)
History and Physical Interval Note:  08/18/2022 7:16 AM  Heidi Tran  has presented today for surgery, with the diagnosis of LEFT BREAST DCIS.  The various methods of treatment have been discussed with the patient and family. After consideration of risks, benefits and other options for treatment, the patient has consented to  Procedure(s): LEFT BREAST BRACKETED LUMPECTOMY WITH RADIOACTIVE SEED LOCALIZATION (Left) as a surgical intervention.  The patient's history has been reviewed, patient examined, no change in status, stable for surgery.  I have reviewed the patient's chart and labs.  Questions were answered to the patient's satisfaction.     [place]

## 2022-08-18 NOTE — Transfer of Care (Signed)
Immediate Anesthesia Transfer of Care Note  Patient: Heidi Tran  Procedure(s) Performed: LEFT BREAST BRACKETED LUMPECTOMY WITH RADIOACTIVE SEED LOCALIZATION (Left: Breast)  Patient Location: PACU  Anesthesia Type:General  Level of Consciousness: awake and pateint uncooperative  Airway & Oxygen Therapy: Patient Spontanous Breathing and Patient connected to face mask oxygen  Post-op Assessment: Report given to RN and Post -op Vital signs reviewed and stable  Post vital signs: Reviewed and stable  Last Vitals:  Vitals Value Taken Time  BP 144/102 08/18/22 0834  Temp    Pulse 91 08/18/22 0841  Resp 14 08/18/22 0841  SpO2 100 % 08/18/22 0841  Vitals shown include unvalidated device data.  Last Pain:  Vitals:   08/18/22 0640  TempSrc: Oral  PainSc: 0-No pain         Complications: No notable events documented.

## 2022-08-18 NOTE — H&P (Signed)
History of Present Illness: Heidi Tran is a 62 y.o. female who is seen today as an office consultation for evaluation of Breast Cancer  Patient seen today in the [facility] for newly diagnosed left breast DCIS upper outer quadrant. Patient has a 1 year history of fullness left breast upper outer quadrant with tenderness. She had a clear nipple discharge dating back over a year but that is stopped. Mammography revealed an area measuring 7 cm encompassing 2 specific areas in the left breast upper outer quadrant. Core biopsies of both showed DCIS intermediate to high-grade ER/PR positive. History of breast cancer mother age 60 and prostate cancer in her father age 81  Review of Systems: A complete review of systems was obtained from the patient. I have reviewed this information and discussed as appropriate with the patient. See HPI as well for other ROS.    Medical History: Past Medical History:  Diagnosis Date  Asthma, unspecified asthma severity, unspecified whether complicated, unspecified whether persistent  History of cancer  Hypertension   Patient Active Problem List  Diagnosis  Ductal carcinoma in situ (DCIS) of left breast  Essential hypertension   Past Surgical History:  Procedure Laterality Date  HYSTERECTOMY  Age 40    Allergies  Allergen Reactions  Shellfish Containing Products Swelling  Throat, lips and tongue swelling  Sulfur (Bulk) Itching and Rash   Current Outpatient Medications on File Prior to Visit  Medication Sig Dispense Refill  fluticasone propion-salmeteroL (ADVAIR DISKUS) 250-50 mcg/dose diskus inhaler Inhale into the lungs  montelukast (SINGULAIR) 10 mg tablet Take 10 mg by mouth at bedtime  triamterene-hydroCHLOROthiazide (MAXZIDE-25) 37.5-25 mg tablet Take 1 tablet by mouth 2 (two) times daily   No current facility-administered medications on file prior to visit.   Family History  Problem Relation Age of Onset  Breast cancer Mother  High blood  pressure (Hypertension) Father  Diabetes Father  Diabetes Brother    Social History   Tobacco Use  Smoking Status Never  Smokeless Tobacco Never    Social History   Socioeconomic History  Marital status: Single  Tobacco Use  Smoking status: Never  Smokeless tobacco: Never  Vaping Use  Vaping Use: Never used  Substance and Sexual Activity  Alcohol use: Not Currently  Drug use: Never   Objective:  There were no vitals filed for this visit.  There is no height or weight on file to calculate BMI.  Physical Exam Exam conducted with a chaperone present.  HENT:  Head: Normocephalic.  Cardiovascular:  Rate and Rhythm: Normal rate.  Pulmonary:  Effort: Pulmonary effort is normal.  Breath sounds: No stridor.  Chest:  Breasts: Right: No mass.  Left: Mass present.   Comments: Fullness left breast from 1:00 centrally located upper outer quadrant through the nipple  Left breast smaller than right Musculoskeletal:  General: Normal range of motion.  Lymphadenopathy:  Upper Body:  Right upper body: No supraclavicular or axillary adenopathy.  Left upper body: No supraclavicular or axillary adenopathy.  Neurological:  General: No focal deficit present.  Mental Status: She is alert.     Labs, Imaging and Diagnostic Testing: Please see above  Assessment and Plan:   Diagnoses and all orders for this visit:  Ductal carcinoma in situ (DCIS) of left breast   Reviewed treatment options of breast conserving surgery versus mastectomy reconstruction. Of note her left breast smaller than right at baseline. She is interested in breast conserving surgery and understands there may be a mild cosmetic defect in the  left depending on how she feels due to breast size. I contrasted this with reconstruction and mastectomy but she would like to proceed with left breast seed localized lumpectomy . Risk of bleeding, infection, cosmetic deformity, breast discrepancy, need for radiation  therapy, cardiovascular events, the use of the seed, and the needs for other treatments and procedures depending on postop course as well as wound healing issues discussed.   Kennieth Francois, MD

## 2022-08-21 ENCOUNTER — Encounter (HOSPITAL_BASED_OUTPATIENT_CLINIC_OR_DEPARTMENT_OTHER): Payer: Self-pay | Admitting: Surgery

## 2022-08-21 ENCOUNTER — Encounter: Payer: Self-pay | Admitting: *Deleted

## 2022-08-22 LAB — SURGICAL PATHOLOGY

## 2022-08-24 ENCOUNTER — Encounter: Payer: Self-pay | Admitting: *Deleted

## 2022-08-28 ENCOUNTER — Ambulatory Visit: Payer: Self-pay | Admitting: Surgery

## 2022-08-28 ENCOUNTER — Other Ambulatory Visit (HOSPITAL_COMMUNITY): Payer: Self-pay

## 2022-08-28 ENCOUNTER — Encounter: Payer: Self-pay | Admitting: Surgery

## 2022-08-28 ENCOUNTER — Other Ambulatory Visit: Payer: Self-pay

## 2022-08-31 ENCOUNTER — Encounter (HOSPITAL_COMMUNITY): Payer: Self-pay

## 2022-09-11 ENCOUNTER — Ambulatory Visit: Payer: 59 | Admitting: Radiation Oncology

## 2022-09-11 ENCOUNTER — Ambulatory Visit
Admission: RE | Admit: 2022-09-11 | Discharge: 2022-09-11 | Disposition: A | Discharge status: Home / Self Care | Payer: 59 | Source: Ambulatory Visit | Attending: Radiation Oncology | Admitting: Radiation Oncology

## 2022-09-11 ENCOUNTER — Ambulatory Visit: Payer: 59

## 2022-09-11 DIAGNOSIS — D0512 Intraductal carcinoma in situ of left breast: Secondary | ICD-10-CM

## 2022-09-11 LAB — COLOGUARD: COLOGUARD: POSITIVE — AB

## 2022-09-13 ENCOUNTER — Other Ambulatory Visit: Payer: Self-pay | Admitting: Nurse Practitioner

## 2022-09-13 DIAGNOSIS — R195 Other fecal abnormalities: Secondary | ICD-10-CM

## 2022-09-14 ENCOUNTER — Other Ambulatory Visit: Payer: Self-pay

## 2022-09-14 ENCOUNTER — Encounter (HOSPITAL_BASED_OUTPATIENT_CLINIC_OR_DEPARTMENT_OTHER): Payer: Self-pay | Admitting: Surgery

## 2022-09-15 ENCOUNTER — Ambulatory Visit: Payer: Self-pay | Admitting: Surgery

## 2022-09-15 ENCOUNTER — Encounter (HOSPITAL_BASED_OUTPATIENT_CLINIC_OR_DEPARTMENT_OTHER)
Admission: RE | Admit: 2022-09-15 | Discharge: 2022-09-15 | Disposition: A | Discharge status: Home / Self Care | Payer: 59 | Source: Ambulatory Visit | Attending: Surgery | Admitting: Surgery

## 2022-09-15 DIAGNOSIS — Z01812 Encounter for preprocedural laboratory examination: Secondary | ICD-10-CM | POA: Insufficient documentation

## 2022-09-15 LAB — BASIC METABOLIC PANEL
Anion gap: 8 (ref 5–15)
BUN: 5 mg/dL — ABNORMAL LOW (ref 8–23)
CO2: 25 mmol/L (ref 22–32)
Calcium: 9.5 mg/dL (ref 8.9–10.3)
Chloride: 105 mmol/L (ref 98–111)
Creatinine, Ser: 0.87 mg/dL (ref 0.44–1.00)
GFR, Estimated: >60 mL/min (ref >60)
Glucose, Bld: 89 mg/dL (ref 70–99)
Potassium: 3.7 mmol/L (ref 3.5–5.1)
Sodium: 138 mmol/L (ref 135–145)

## 2022-09-15 NOTE — Progress Notes (Signed)

## 2022-09-19 ENCOUNTER — Encounter: Payer: Self-pay | Admitting: *Deleted

## 2022-09-21 ENCOUNTER — Encounter (HOSPITAL_BASED_OUTPATIENT_CLINIC_OR_DEPARTMENT_OTHER): Payer: Self-pay | Admitting: Surgery

## 2022-09-21 ENCOUNTER — Ambulatory Visit (HOSPITAL_BASED_OUTPATIENT_CLINIC_OR_DEPARTMENT_OTHER): Payer: 59 | Admitting: Anesthesiology

## 2022-09-21 ENCOUNTER — Other Ambulatory Visit: Payer: Self-pay

## 2022-09-21 ENCOUNTER — Ambulatory Visit (HOSPITAL_BASED_OUTPATIENT_CLINIC_OR_DEPARTMENT_OTHER)
Admission: RE | Admit: 2022-09-21 | Discharge: 2022-09-21 | Disposition: A | Discharge status: Home / Self Care | Payer: 59 | Attending: Surgery | Admitting: Surgery

## 2022-09-21 ENCOUNTER — Encounter (HOSPITAL_BASED_OUTPATIENT_CLINIC_OR_DEPARTMENT_OTHER)
Admission: RE | Disposition: A | Discharge status: Home / Self Care | Payer: Self-pay | Source: Home / Self Care | Attending: Surgery

## 2022-09-21 ENCOUNTER — Other Ambulatory Visit (HOSPITAL_COMMUNITY): Payer: Self-pay

## 2022-09-21 DIAGNOSIS — J45909 Unspecified asthma, uncomplicated: Secondary | ICD-10-CM

## 2022-09-21 DIAGNOSIS — Z01818 Encounter for other preprocedural examination: Secondary | ICD-10-CM

## 2022-09-21 DIAGNOSIS — C50912 Malignant neoplasm of unspecified site of left female breast: Secondary | ICD-10-CM

## 2022-09-21 DIAGNOSIS — Z8673 Personal history of transient ischemic attack (TIA), and cerebral infarction without residual deficits: Secondary | ICD-10-CM | POA: Diagnosis not present

## 2022-09-21 DIAGNOSIS — Z170 Estrogen receptor positive status [ER+]: Secondary | ICD-10-CM | POA: Insufficient documentation

## 2022-09-21 DIAGNOSIS — I10 Essential (primary) hypertension: Secondary | ICD-10-CM | POA: Diagnosis not present

## 2022-09-21 DIAGNOSIS — N641 Fat necrosis of breast: Secondary | ICD-10-CM | POA: Insufficient documentation

## 2022-09-21 HISTORY — PX: SENTINEL NODE BIOPSY: SHX6608

## 2022-09-21 HISTORY — PX: RE-EXCISION OF BREAST LUMPECTOMY: SHX6048

## 2022-09-21 SURGERY — EXCISION, LESION, BREAST
Anesthesia: General | Site: Breast | Laterality: Left

## 2022-09-21 MED ORDER — BUPIVACAINE HCL (PF) 0.25 % IJ SOLN
INTRAMUSCULAR | Status: DC | PRN
  Administered 2022-09-21: 30 mL

## 2022-09-21 MED ORDER — OXYCODONE HCL 5 MG PO TABS
5.0000 mg | ORAL_TABLET | Freq: Four times a day (QID) | ORAL | 0 refills | Status: DC | PRN
Start: 2022-09-21 — End: 2022-12-26
  Filled 2022-09-21: qty 15, 4d supply, fill #0

## 2022-09-21 MED ORDER — LIDOCAINE HCL (CARDIAC) PF 100 MG/5ML IV SOSY
PREFILLED_SYRINGE | INTRAVENOUS | Status: DC | PRN
  Administered 2022-09-21: 40 mg via INTRATRACHEAL

## 2022-09-21 MED ORDER — CHLORHEXIDINE GLUCONATE CLOTH 2 % EX PADS: 6.0000 | MEDICATED_PAD | Freq: Once | CUTANEOUS | Status: DC

## 2022-09-21 MED ORDER — OXYCODONE HCL 5 MG/5ML PO SOLN: 5.0000 mg | Freq: Once | ORAL | Status: DC | PRN

## 2022-09-21 MED ORDER — DEXMEDETOMIDINE HCL IN NACL 80 MCG/20ML IV SOLN
INTRAVENOUS | Status: DC | PRN
  Administered 2022-09-21: 12 ug via INTRAVENOUS
  Administered 2022-09-21: 8 ug via INTRAVENOUS

## 2022-09-21 MED ORDER — CEFAZOLIN SODIUM-DEXTROSE 2-4 GM/100ML-% IV SOLN
INTRAVENOUS | Status: AC
  Filled 2022-09-21: qty 100

## 2022-09-21 MED ORDER — ROPIVACAINE HCL 5 MG/ML IJ SOLN
INTRAMUSCULAR | Status: DC | PRN
  Administered 2022-09-21: 30 mL via PERINEURAL

## 2022-09-21 MED ORDER — FENTANYL CITRATE (PF) 100 MCG/2ML IJ SOLN
INTRAMUSCULAR | Status: DC | PRN
  Administered 2022-09-21: 25 ug via INTRAVENOUS
  Administered 2022-09-21 (×2): 50 ug via INTRAVENOUS
  Administered 2022-09-21: 25 ug via INTRAVENOUS
  Administered 2022-09-21: 50 ug via INTRAVENOUS

## 2022-09-21 MED ORDER — DEXAMETHASONE SODIUM PHOSPHATE 10 MG/ML IJ SOLN
INTRAMUSCULAR | Status: AC
  Filled 2022-09-21: qty 1

## 2022-09-21 MED ORDER — OXYCODONE HCL 5 MG PO TABS: 5.0000 mg | ORAL_TABLET | Freq: Once | ORAL | Status: DC | PRN

## 2022-09-21 MED ORDER — LACTATED RINGERS IV SOLN: INTRAVENOUS | Status: DC

## 2022-09-21 MED ORDER — SODIUM CHLORIDE 0.9 % IV SOLN
INTRAVENOUS | Status: DC | PRN
  Administered 2022-09-21: 500 mL

## 2022-09-21 MED ORDER — FENTANYL CITRATE (PF) 100 MCG/2ML IJ SOLN
INTRAMUSCULAR | Status: AC
  Filled 2022-09-21: qty 2

## 2022-09-21 MED ORDER — FENTANYL CITRATE (PF) 100 MCG/2ML IJ SOLN
100.0000 ug | Freq: Once | INTRAMUSCULAR | Status: AC
  Administered 2022-09-21: 100 ug via INTRAVENOUS

## 2022-09-21 MED ORDER — CEFAZOLIN IN SODIUM CHLORIDE 3-0.9 GM/100ML-% IV SOLN: 3.0000 g | INTRAVENOUS | Status: DC

## 2022-09-21 MED ORDER — CEFAZOLIN SODIUM-DEXTROSE 2-4 GM/100ML-% IV SOLN
2.0000 g | INTRAVENOUS | Status: AC
  Administered 2022-09-21: 2 g via INTRAVENOUS

## 2022-09-21 MED ORDER — DEXAMETHASONE SODIUM PHOSPHATE 10 MG/ML IJ SOLN
INTRAMUSCULAR | Status: DC | PRN
  Administered 2022-09-21: 10 mg via INTRAVENOUS

## 2022-09-21 MED ORDER — HYDROMORPHONE HCL 1 MG/ML IJ SOLN
INTRAMUSCULAR | Status: AC
  Filled 2022-09-21: qty 0.5

## 2022-09-21 MED ORDER — PROMETHAZINE HCL 25 MG/ML IJ SOLN: 6.2500 mg | INTRAMUSCULAR | Status: DC | PRN

## 2022-09-21 MED ORDER — IBUPROFEN 800 MG PO TABS
800.0000 mg | ORAL_TABLET | Freq: Three times a day (TID) | ORAL | 0 refills | Status: DC | PRN
  Filled 2022-09-21: qty 30, 10d supply, fill #0

## 2022-09-21 MED ORDER — AMISULPRIDE (ANTIEMETIC) 5 MG/2ML IV SOLN: 10.0000 mg | Freq: Once | INTRAVENOUS | Status: DC | PRN

## 2022-09-21 MED ORDER — PROPOFOL 10 MG/ML IV BOLUS
INTRAVENOUS | Status: AC
  Filled 2022-09-21: qty 20

## 2022-09-21 MED ORDER — DEXMEDETOMIDINE HCL IN NACL 80 MCG/20ML IV SOLN
INTRAVENOUS | Status: AC
  Filled 2022-09-21: qty 20

## 2022-09-21 MED ORDER — ONDANSETRON HCL 4 MG/2ML IJ SOLN
INTRAMUSCULAR | Status: AC
  Filled 2022-09-21: qty 2

## 2022-09-21 MED ORDER — MIDAZOLAM HCL 2 MG/2ML IJ SOLN
INTRAMUSCULAR | Status: AC
  Filled 2022-09-21: qty 2

## 2022-09-21 MED ORDER — PROPOFOL 10 MG/ML IV BOLUS
INTRAVENOUS | Status: DC | PRN
  Administered 2022-09-21: 200 mg via INTRAVENOUS

## 2022-09-21 MED ORDER — MIDAZOLAM HCL 2 MG/2ML IJ SOLN
2.0000 mg | Freq: Once | INTRAMUSCULAR | Status: AC
  Administered 2022-09-21: 2 mg via INTRAVENOUS

## 2022-09-21 MED ORDER — HYDROMORPHONE HCL 1 MG/ML IJ SOLN: 0.2500 mg | INTRAMUSCULAR | Status: DC | PRN

## 2022-09-21 MED ORDER — MEPERIDINE HCL 25 MG/ML IJ SOLN: 6.2500 mg | INTRAMUSCULAR | Status: DC | PRN

## 2022-09-21 MED ORDER — PHENYLEPHRINE HCL (PRESSORS) 10 MG/ML IV SOLN
INTRAVENOUS | Status: DC | PRN
  Administered 2022-09-21: 80 ug via INTRAVENOUS

## 2022-09-21 MED ORDER — LIDOCAINE 2% (20 MG/ML) 5 ML SYRINGE
INTRAMUSCULAR | Status: AC
  Filled 2022-09-21: qty 5

## 2022-09-21 MED ORDER — PHENYLEPHRINE 80 MCG/ML (10ML) SYRINGE FOR IV PUSH (FOR BLOOD PRESSURE SUPPORT)
PREFILLED_SYRINGE | INTRAVENOUS | Status: AC
  Filled 2022-09-21: qty 10

## 2022-09-21 MED ORDER — ONDANSETRON HCL 4 MG/2ML IJ SOLN
INTRAMUSCULAR | Status: DC | PRN
  Administered 2022-09-21: 4 mg via INTRAVENOUS

## 2022-09-21 SURGICAL SUPPLY — 47 items
ADH SKN CLS APL DERMABOND .7 (GAUZE/BANDAGES/DRESSINGS) ×2
APL PRP STRL LF DISP 70% ISPRP (MISCELLANEOUS) ×2
APPLIER CLIP 9.375 MED OPEN (MISCELLANEOUS) ×2 IMPLANT
APR CLP MED 9.3 20 MLT OPN (MISCELLANEOUS) ×2
BINDER BREAST LRG (GAUZE/BANDAGES/DRESSINGS) IMPLANT
BINDER BREAST MEDIUM (GAUZE/BANDAGES/DRESSINGS) IMPLANT
BINDER BREAST XLRG (GAUZE/BANDAGES/DRESSINGS) IMPLANT
BINDER BREAST XXLRG (GAUZE/BANDAGES/DRESSINGS) IMPLANT
BLADE SURG 15 STRL LF DISP TIS (BLADE) ×3 IMPLANT
BLADE SURG 15 STRL SS (BLADE) ×2
CANISTER SUCT 1200ML W/VALVE (MISCELLANEOUS) ×3 IMPLANT
CHLORAPREP W/TINT 26 (MISCELLANEOUS) ×3 IMPLANT
CLIP APPLIE 9.375 MED OPEN (MISCELLANEOUS) IMPLANT
COVER BACK TABLE 60X90IN (DRAPES) ×3 IMPLANT
COVER MAYO STAND STRL (DRAPES) ×3 IMPLANT
DERMABOND ADVANCED .7 DNX12 (GAUZE/BANDAGES/DRESSINGS) ×3 IMPLANT
DRAPE LAPAROSCOPIC ABDOMINAL (DRAPES) IMPLANT
DRAPE LAPAROTOMY 100X72 PEDS (DRAPES) ×3 IMPLANT
DRAPE UTILITY XL STRL (DRAPES) ×3 IMPLANT
ELECT COATED BLADE 2.86 ST (ELECTRODE) ×3 IMPLANT
ELECT REM PT RETURN 9FT ADLT (ELECTROSURGICAL) ×2 IMPLANT
ELECTRODE REM PT RTRN 9FT ADLT (ELECTROSURGICAL) ×3 IMPLANT
GLOVE BIOGEL PI IND STRL 8 (GLOVE) ×3 IMPLANT
GLOVE ECLIPSE 8.0 STRL XLNG CF (GLOVE) ×3 IMPLANT
GOWN STRL REUS W/ TWL LRG LVL3 (GOWN DISPOSABLE) ×6 IMPLANT
GOWN STRL REUS W/ TWL XL LVL3 (GOWN DISPOSABLE) ×3 IMPLANT
GOWN STRL REUS W/TWL LRG LVL3 (GOWN DISPOSABLE) ×4
GOWN STRL REUS W/TWL XL LVL3 (GOWN DISPOSABLE) ×2
HEMOSTAT ARISTA ABSORB 3G PWDR (HEMOSTASIS) IMPLANT
KIT MARKER MARGIN INK (KITS) IMPLANT
NDL HYPO 25X1 1.5 SAFETY (NEEDLE) ×3 IMPLANT
NEEDLE HYPO 25X1 1.5 SAFETY (NEEDLE) ×2 IMPLANT
NS IRRIG 1000ML POUR BTL (IV SOLUTION) ×3 IMPLANT
PACK BASIN DAY SURGERY FS (CUSTOM PROCEDURE TRAY) ×3 IMPLANT
PENCIL SMOKE EVACUATOR (MISCELLANEOUS) ×3 IMPLANT
SLEEVE SCD COMPRESS KNEE MED (STOCKING) ×3 IMPLANT
SPIKE FLUID TRANSFER (MISCELLANEOUS) ×3 IMPLANT
SPONGE T-LAP 4X18 ~~LOC~~+RFID (SPONGE) ×3 IMPLANT
STAPLER VISISTAT 35W (STAPLE) IMPLANT
SUT MNCRL AB 4-0 PS2 18 (SUTURE) IMPLANT
SUT SILK 2 0 SH (SUTURE) IMPLANT
SUT VICRYL 3-0 CR8 SH (SUTURE) ×3 IMPLANT
SYR CONTROL 10ML LL (SYRINGE) ×3 IMPLANT
TOWEL GREEN STERILE FF (TOWEL DISPOSABLE) ×6 IMPLANT
TRAY FAXITRON CT DISP (TRAY / TRAY PROCEDURE) IMPLANT
TUBE CONNECTING 20X1/4 (TUBING) ×3 IMPLANT
YANKAUER SUCT BULB TIP NO VENT (SUCTIONS) ×3 IMPLANT

## 2022-09-21 NOTE — H&P (Signed)
History of Present Illness: Heidi Tran is a 62 y.o. female who is seen today for patient returns 1 month postop left breast lumpectomy for DCIS. 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 She had some separation and drainage from the incision but is now healing. She has no complaints..    Review of Systems: A complete review of systems was obtained from the patient. I have reviewed this information and discussed as appropriate with the patient. See HPI as well for other ROS.    Medical History: Past Medical History:  Diagnosis Date  Arthritis  Asthma, unspecified asthma severity, unspecified whether complicated, unspecified whether persistent ([facility])  History of cancer  Hypertension   Patient Active Problem List  Diagnosis  Ductal carcinoma in situ (DCIS) of left breast  Essential hypertension   Past Surgical History:  Procedure Laterality Date  BREAST SURGERY Left 07/2020  HYSTERECTOMY  Age 16    Allergies  Allergen Reactions  Shellfish Containing Products Swelling  Throat, lips and tongue swelling  Sulfa (Sulfonamide Antibiotics) Other (See Comments)  Sulfur (Bulk) Itching and Rash   Current Outpatient Medications on File Prior to Visit  Medication Sig Dispense Refill  fluticasone propion-salmeteroL (ADVAIR DISKUS) 250-50 mcg/dose diskus inhaler Inhale into the lungs  montelukast (SINGULAIR) 10 mg tablet Take 10 mg by mouth at bedtime  triamterene-hydroCHLOROthiazide (MAXZIDE-25) 37.5-25 mg tablet Take 1 tablet by mouth 2 (two) times daily   No current facility-administered medications on file prior to visit.   Family History  Problem Relation Age of Onset  Breast cancer Mother  High blood pressure (Hypertension) Father  Diabetes Father  Diabetes Brother    Social History   Tobacco Use  Smoking Status Never  Smokeless Tobacco Never    Social History   Socioeconomic History  Marital status:  Single  Tobacco Use  Smoking status: Never  Smokeless tobacco: Never  Vaping Use  Vaping status: Never Used  Substance and Sexual Activity  Alcohol use: Not Currently  Drug use: Never   Objective:   Vitals:  09/15/22 1046  PainSc: 3  PainLoc: Breast   There is no height or weight on file to calculate BMI.  Left breast incision shows an area of separation at the inferior border which is scabbed over. No cosmetic deformity. No evidence of infection.  Labs, Imaging and Diagnostic Testing:  FINAL MICROSCOPIC DIAGNOSIS:  A. LEFT BREAST, LUMPECTOMY: Encapsulated papillary carcinoma (high nuclear grade) Adjacent high-grade ductal carcinoma in situ, cribriform type with necrosis Negative for invasive carcinoma Tumor measures 6.9 cm in greatest dimension DCIS/papillary carcinoma present in superior and anterior margins and 0.1 mm from posterior margin Microcalcifications present within DCIS and papillary carcinoma Changes consistent with prior biopsies (coil clip and ribbon clip) Prognostic markers (from report [id number]): Estrogen receptor positive; progesterone receptor positive  ONCOLOGY TABLE:  DCIS OF THE BREAST: Resection Procedure: Lumpectomy Specimen Laterality: Left Histologic Type: Encapsulated papillary carcinoma with associated ductal carcinoma in situ Size of DCIS: 6.9 cm in greatest dimension (tumor involves 12 of 12 submitted blocks) Nuclear Grade: High nuclear grade Necrosis: Present Margins: DCIS present within the superior and anterior margins and 0.1 mm from posterior margin Regional Lymph Nodes: Not applicable (no lymph nodes submitted or found) Breast Biomarker Testing Performed on Previous Biopsy: Testing performed on Case Number: [id number] Estrogen Receptor: Positive (100%, strong staining) Progesterone Receptor: Positive (95%, strong staining) Pathologic Stage Classification (pTNM, AJCC 8th Edition): pTis, pN n/a Representative Tumor Block:  A8, A9  Comment(s): The tumor spans the entire specimen from biopsy clip to biopsy clip. (v4.4.0.0)  GROSS DESCRIPTION:  Specimen type: Left breast seed (x 2) localization lumpectomy, received fresh. The specimen is placed in formalin at 8:30 AM on 08/18/2022. Size: 6.9 cm at the lateral-medial axis, 5.1 cm at the anterior-posterior axis and 3.1 cm at the superior-inferior axis. Orientation: The specimen is oriented with previously applied inks (anterior green, inferior blue, lateral orange, medial yellow, posterior black, superior red). Localized area: The localization seeds are identified at the time of receipt. One seed is received separate from the tissue. Cut surface: There are 2 biopsy clips present. A coil clip at the medial aspect and a ribbon clip in the midportion. The clips are approximately 1.5 cm apart. Throughout the specimen there is an indurated white nodular lesion with pale yellow pinpoint foci. This area is most prominent in the midportion but is located at both the medial and lateral aspects spanning an area measuring approximately 6.5 cm. The remainder of the breast tissue consist of soft yellow adipose tissue and white fibrous tissue. Margins: The lesion involves the inked margins. Prognostic indicators: Obtained from paraffin blocks if needed Block summary: 12 blocks submitted. The sections are sequentially labeled from medial through lateral with section at the coil clip in A1 and sections at the ribbon clip in A7 and A8. (GRP 08/18/2022)  Assessment and Plan:   Diagnoses and all orders for this visit:  Post-operative state    Patient found to have an encapsulated papillary carcinoma with positive margins as well as DCIS. I have discussed reexcision with her and she is agreed to proceed to clear her margins. Also offered sentinel lymph node mapping since she is under the age of 63 as well and she is agreed to proceed. Risks of bleeding, infection, cosmetic  deformity, the need for further surgery, lymphedema, shoulder pain, numbness, shoulder weakness, injury to major vascular structures, injury to major nerve structures, need for more surgery and/or possible mastectomy if margins cannot be clear were reviewed today.  No follow-ups on file.  Hayden Rasmussen, MD

## 2022-09-21 NOTE — Anesthesia Postprocedure Evaluation (Signed)
Anesthesia Post Note  Patient: Heidi Tran  Procedure(s) Performed: RE-EXCISION OF LEFT BREAST LUMPECTOMY (Left: Breast) LEFTSENTINEL LYMPH NODE MAPPING (Left: Axilla)     Patient location during evaluation: PACU Anesthesia Type: General Level of consciousness: awake and alert Pain management: pain level controlled Vital Signs Assessment: post-procedure vital signs reviewed and stable Respiratory status: spontaneous breathing, nonlabored ventilation and respiratory function stable Cardiovascular status: blood pressure returned to baseline and stable Postop Assessment: no apparent nausea or vomiting Anesthetic complications: no   No notable events documented.  Last Vitals:  Vitals:   09/21/22 1400 09/21/22 1422  BP: 135/79 134/78  Pulse: 80 79  Resp: 14 18  Temp:  36.6 C  SpO2: 97% 93%    Last Pain:  Vitals:   09/21/22 1422  TempSrc: Temporal  PainSc: 3                  Lowella Curb

## 2022-09-21 NOTE — Progress Notes (Signed)
Assisted Dr. Miller with left, pectoralis, ultrasound guided block. Side rails up, monitors on throughout procedure. See vital signs in flow sheet. Tolerated Procedure well. 

## 2022-09-21 NOTE — Anesthesia Procedure Notes (Signed)
Procedure Name: LMA Insertion Date/Time: 09/21/2022 11:50 AM  Performed by: Thornell Mule, CRNAPre-anesthesia Checklist: Patient identified, Emergency Drugs available, Suction available and Patient being monitored Patient Re-evaluated:Patient Re-evaluated prior to induction Oxygen Delivery Method: Circle system utilized Preoxygenation: Pre-oxygenation with 100% oxygen Induction Type: IV induction LMA: LMA inserted LMA Size: 4.0 Number of attempts: 1 Placement Confirmation: positive ETCO2 Tube secured with: Tape Dental Injury: Teeth and Oropharynx as per pre-operative assessment

## 2022-09-21 NOTE — Anesthesia Procedure Notes (Signed)
Anesthesia Regional Block: Pectoralis block   Pre-Anesthetic Checklist: , timeout performed,  Correct Patient, Correct Site, Correct Laterality,  Correct Procedure, Correct Position, site marked,  Risks and benefits discussed,  Surgical consent,  Pre-op evaluation,  At surgeon's request and post-op pain management  Laterality: Left  Prep: chloraprep       Needles:  Injection technique: Single-shot  Needle Type: Stimiplex     Needle Length: 9cm  Needle Gauge: 21     Additional Needles:   Procedures:,,,, ultrasound used (permanent image in chart),,    Narrative:  Start time: 09/21/2022 10:51 AM End time: 09/21/2022 10:56 AM Injection made incrementally with aspirations every 5 mL.  Performed by: Personally  Anesthesiologist: Lowella Curb, MD

## 2022-09-21 NOTE — Interval H&P Note (Signed)
History and Physical Interval Note:  09/21/2022 11:20 AM  Heidi Tran  has presented today for surgery, with the diagnosis of LEFT BREAST CANCER.  The various methods of treatment have been discussed with the patient and family. After consideration of risks, benefits and other options for treatment, the patient has consented to  Procedure(s): RE-EXCISION OF LEFT BREAST LUMPECTOMY (Left) LEFTSENTINEL LYMPH NODE MAPPING (Left) as a surgical intervention.  The patient's history has been reviewed, patient examined, no change in status, stable for surgery.  I have reviewed the patient's chart and labs.  Questions were answered to the patient's satisfaction.   The procedure has been discussed with the patient. Alternatives to surgery have been discussed with the patient.  Risks of surgery include bleeding,  Infection,  Seroma formation, death,  and the need for further surgery.   The patient understands and wishes to proceed.   Lanae Federer A Naima Veldhuizen

## 2022-09-21 NOTE — Anesthesia Preprocedure Evaluation (Addendum)
Anesthesia Evaluation  Patient identified by MRN, date of birth, ID band Patient awake    Reviewed: Allergy & Precautions, NPO status , Patient's Chart, lab work & pertinent test results  Airway Mallampati: II  TM Distance: >3 FB Neck ROM: Full    Dental no notable dental hx.    Pulmonary asthma    Pulmonary exam normal        Cardiovascular hypertension, Pt. on medications  Rhythm:Regular Rate:Normal     Neuro/Psych CVA  negative psych ROS   GI/Hepatic Neg liver ROS,GERD  ,,  Endo/Other  negative endocrine ROS    Renal/GU negative Renal ROS  negative genitourinary   Musculoskeletal Left breast DCIS   Abdominal Normal abdominal exam  (+)   Peds  Hematology negative hematology ROS (+)   Anesthesia Other Findings   Reproductive/Obstetrics                             Anesthesia Physical Anesthesia Plan  ASA: 3  Anesthesia Plan: General and Regional   Post-op Pain Management: Minimal or no pain anticipated   Induction: Intravenous  PONV Risk Score and Plan: 3 and Ondansetron, Dexamethasone, Treatment may vary due to age or medical condition and Midazolam  Airway Management Planned: LMA  Additional Equipment: None  Intra-op Plan:   Post-operative Plan: Extubation in OR  Informed Consent: I have reviewed the patients History and Physical, chart, labs and discussed the procedure including the risks, benefits and alternatives for the proposed anesthesia with the patient or authorized representative who has indicated his/her understanding and acceptance.     Dental advisory given  Plan Discussed with: CRNA  Anesthesia Plan Comments:        Anesthesia Quick Evaluation

## 2022-09-21 NOTE — Discharge Instructions (Addendum)
Post Anesthesia Home Care Instructions  Activity: Get plenty of rest for the remainder of the day. A responsible individual must stay with you for 24 hours following the procedure.  For the next 24 hours, DO NOT: -Drive a car -Advertising copywriter -Drink alcoholic beverages -Take any medication unless instructed by your physician -Make any legal decisions or sign important papers.  Meals: Start with liquid foods such as gelatin or soup. Progress to regular foods as tolerated. Avoid greasy, spicy, heavy foods. If nausea and/or vomiting occur, drink only clear liquids until the nausea and/or vomiting subsides. Call your physician if vomiting continues.  Special Instructions/Symptoms: Your throat may feel dry or sore from the anesthesia or the breathing tube placed in your throat during surgery. If this causes discomfort, gargle with warm salt water. The discomfort should disappear within 24 hours.  If you had a scopolamine patch placed behind your ear for the management of post- operative nausea and/or vomiting:  1. The medication in the patch is effective for 72 hours, after which it should be removed.  Wrap patch in a tissue and discard in the trash. Wash hands thoroughly with soap and water. 2. You may remove the patch earlier than 72 hours if you experience unpleasant side effects which may include dry mouth, dizziness or visual disturbances. 3. Avoid touching the patch. Wash your hands with soap and water after contact with the patch.     Regional Anesthesia Blocks  1. Numbness or the inability to move the "blocked" extremity may last from 3-48 hours after placement. The length of time depends on the medication injected and your individual response to the medication. If the numbness is not going away after 48 hours, call your surgeon.  2. The extremity that is blocked will need to be protected until the numbness is gone and the  Strength has returned. Because you cannot feel it, you  will need to take extra care to avoid injury. Because it may be weak, you may have difficulty moving it or using it. You may not know what position it is in without looking at it while the block is in effect.  3. For blocks in the legs and feet, returning to weight bearing and walking needs to be done carefully. You will need to wait until the numbness is entirely gone and the strength has returned. You should be able to move your leg and foot normally before you try and bear weight or walk. You will need someone to be with you when you first try to ensure you do not fall and possibly risk injury.  4. Bruising and tenderness at the needle site are common side effects and will resolve in a few days.  5. Persistent numbness or new problems with movement should be communicated to the surgeon or the [facility] [phone] [facility] [phone]).    [facility] [employer] [facility] Office Phone Number [phone]  BREAST BIOPSY/ PARTIAL MASTECTOMY: POST OP INSTRUCTIONS  Always review your discharge instruction sheet given to you by the facility where your surgery was performed.  IF YOU HAVE DISABILITY OR FAMILY LEAVE FORMS, YOU MUST BRING THEM TO THE OFFICE FOR PROCESSING.  DO NOT GIVE THEM TO YOUR DOCTOR.  A prescription for pain medication may be given to you upon discharge.  Take your pain medication as prescribed, if needed.  If narcotic pain medicine is not needed, then you may take acetaminophen (Tylenol) or ibuprofen (Advil) as needed. Take your usually prescribed medications  unless otherwise directed If you need a refill on your pain medication, please contact your pharmacy.  They will contact our office to request authorization.  Prescriptions will not be filled after 5pm or on week-ends. You should eat very light the first 24 hours after surgery, such as soup, crackers, pudding, etc.  Resume your normal diet the day after surgery. Most patients will  experience some swelling and bruising in the breast.  Ice packs and a good support bra will help.  Swelling and bruising can take several days to resolve.  It is common to experience some constipation if taking pain medication after surgery.  Increasing fluid intake and taking a stool softener will usually help or prevent this problem from occurring.  A mild laxative (Milk of Magnesia or Miralax) should be taken according to package directions if there are no bowel movements after 48 hours. Unless discharge instructions indicate otherwise, you may remove your bandages 24-48 hours after surgery, and you may shower at that time.  You may have steri-strips (small skin tapes) in place directly over the incision.  These strips should be left on the skin for 7-10 days.  If your surgeon used skin glue on the incision, you may shower in 24 hours.  The glue will flake off over the next 2-3 weeks.  Any sutures or staples will be removed at the office during your follow-up visit. ACTIVITIES:  You may resume regular daily activities (gradually increasing) beginning the next day.  Wearing a good support bra or sports bra minimizes pain and swelling.  You may have sexual intercourse when it is comfortable. You may drive when you no longer are taking prescription pain medication, you can comfortably wear a seatbelt, and you can safely maneuver your car and apply brakes. RETURN TO WORK:  ______________________________________________________________________________________ Bonita Quin should see your doctor in the office for a follow-up appointment approximately two weeks after your surgery.  Your doctor's nurse will typically make your follow-up appointment when she calls you with your pathology report.  Expect your pathology report 2-3 business days after your surgery.  You may call to check if you do not hear from [place] after three days. OTHER INSTRUCTIONS:  _______________________________________________________________________________________________ _____________________________________________________________________________________________________________________________________ _____________________________________________________________________________________________________________________________________ _____________________________________________________________________________________________________________________________________  WHEN TO CALL YOUR DOCTOR: Fever over 101.0 Nausea and/or vomiting. Extreme swelling or bruising. Continued bleeding from incision. Increased pain, redness, or drainage from the incision.  The clinic staff is available to answer your questions during regular business hours.  Please don't hesitate to call and ask to speak to one of the nurses for clinical concerns.  If you have a medical emergency, go to the nearest emergency room or call 911.  A surgeon from [facility] is always on call at the hospital.  For further questions, please visit [email or web address]

## 2022-09-21 NOTE — Op Note (Signed)
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  Her margins were positive and she returns today for wide reexcision of her margins.  We also recommended sentinel lymph node mapping since she was under the age of 86.  Risks and benefits reviewed as well as complications long-term expectations quality of life and the rationale.The procedure has been discussed with the patient. Alternatives to surgery have been discussed with the patient.  Risks of surgery include bleeding,  Infection,  Seroma formation, death,  and the need for further surgery.   The patient understands and wishes to proceed.    Description of procedure: The patient was met in the holding area and questions were answered.  Left breast was marked as correct site.  She was taken back to the operating room and placed supine upon the OR table.  After induction of general anesthesia, the left breast was prepped with alcohol and 2 cc of MAC tracer injected in the subareolar position.  This was massaged for 5 minutes.  The left breast was then prepped and draped a second time and a second timeout performed.  Previous incision was opened and the seroma evacuated.  I excised all margins.  This included skin with the anterior margin.  Cavities irrigated.  He was made hemostatic with cautery and local anesthetic was infiltrated throughout the cavity.  Clips were placed to mark the cavity.  The deep  layer was then approximated using 3-0 Vicryl.  4 Monocryl was used to approximate the skin layer.  The mag trace probe was used.  Hotspots were identified left axilla and a left Eksir incision was made which was about 5 cm.  Dissection was carried down to the deep level 1 contents.  There were 3 deep level 1 axillary lymph nodes identified and removed.  Background counts showed no spike with tracer activity.  Irrigation was used and hemostasis achieved.  Local anesthetic infiltrated.  The deep tissue layers were approximated with 3-0 Vicryl.  4 Monocryl was used to close skin in a subcuticular fashion.  Dermabond applied.  Breast binder placed.  All counts found to be correct.  The patient was awoke extubated taken to recovery in satisfactory condition.

## 2022-09-21 NOTE — Transfer of Care (Signed)
Immediate Anesthesia Transfer of Care Note  Patient: Heidi Tran  Procedure(s) Performed: RE-EXCISION OF LEFT BREAST LUMPECTOMY (Left: Breast) LEFTSENTINEL LYMPH NODE MAPPING (Left: Axilla)  Patient Location: PACU  Anesthesia Type:GA combined with regional for post-op pain  Level of Consciousness: drowsy and patient cooperative  Airway & Oxygen Therapy: Patient Spontanous Breathing and Patient connected to face mask oxygen  Post-op Assessment: Report given to RN and Post -op Vital signs reviewed and stable  Post vital signs: Reviewed and stable  Last Vitals:  Vitals Value Taken Time  BP 131/87 09/21/22 1315  Temp    Pulse 82 09/21/22 1315  Resp 13 09/21/22 1315  SpO2 100 % 09/21/22 1315  Vitals shown include unvalidated device data.  Last Pain:  Vitals:   09/21/22 1011  TempSrc: Oral  PainSc: 2       Patients Stated Pain Goal: 6 (09/21/22 1011)  Complications: No notable events documented.

## 2022-09-22 ENCOUNTER — Encounter (HOSPITAL_BASED_OUTPATIENT_CLINIC_OR_DEPARTMENT_OTHER): Payer: Self-pay | Admitting: Surgery

## 2022-09-22 LAB — SURGICAL PATHOLOGY

## 2022-09-25 ENCOUNTER — Encounter: Payer: Self-pay | Admitting: Surgery

## 2022-09-25 ENCOUNTER — Encounter: Payer: Self-pay | Admitting: *Deleted

## 2022-09-28 ENCOUNTER — Other Ambulatory Visit (HOSPITAL_COMMUNITY): Payer: Self-pay

## 2022-09-28 ENCOUNTER — Other Ambulatory Visit: Payer: Self-pay

## 2022-09-28 ENCOUNTER — Other Ambulatory Visit: Payer: Self-pay | Admitting: Nurse Practitioner

## 2022-09-28 DIAGNOSIS — J4540 Moderate persistent asthma, uncomplicated: Secondary | ICD-10-CM

## 2022-09-28 MED ORDER — FLUTICASONE-SALMETEROL 250-50 MCG/ACT IN AEPB
1.0000 | INHALATION_SPRAY | Freq: Two times a day (BID) | RESPIRATORY_TRACT | 0 refills | Status: DC
Start: 2022-09-28 — End: 2022-10-27
  Filled 2022-09-28 – 2022-10-04 (×2): qty 60, 30d supply, fill #0

## 2022-09-28 NOTE — Telephone Encounter (Signed)
Please advise KH 

## 2022-09-28 NOTE — Progress Notes (Incomplete)
Location of Breast Cancer: Ductal carcinoma in situ (DCIS) of left breast    Histology per Pathology Report:  07-18-22   09-21-22 FINAL MICROSCOPIC DIAGNOSIS:  A. BREAST, LEFT ANTERIOR MARGIN, RE-EXCISION: -  Benign skin and subcutaneous soft tissue (no definitive breast parenchyma present) with extensive fat necrosis/biopsy site changes.  B. BREAST, LEFT LATERAL MARGIN, RE-EXCISION: -  Benign breast tissue with extensive fat necrosis/biopsy site changes.  C. BREAST, LEFT INFERIOR MARGIN, RE-EXCISION: -  Benign breast tissue with extensive fat necrosis/biopsy site changes.  D. BREAST, LEFT SUPERIOR MARGIN, RE-EXCISION: -  Benign breast tissue with extensive fat necrosis/biopsy site changes.  E. BREAST, LEFT MEDIAL MARGIN, RE-EXCISION: -  Benign breast tissue with extensive fat necrosis/biopsy site changes.  F. BREAST, LEFT POSTERIOR MARGIN, RE-EXCISION: -  Benign breast tissue with extensive fat necrosis/biopsy site changes.  G. LYMPH NODE, LEFT AXILLARY, SENTINEL, EXCISION: -  1 lymph node, negative for malignancy (0/1, multiple levels examined.  H. LYMPH NODE, LEFT AXILLARY, SENTINEL, EXCISION: -  1 lymph node, negative for malignancy (0/1), multiple levels examined.  I. LYMPH NODE, LEFT AXILLARY, SENTINEL, EXCISION: -  1 lymph node, negative for malignancy (0/1), serially sectioned and multiple levels examined.  J. LYMPH NODE, LEFT AXILLARY, SENTINEL, EXCISION: -  1 lymph node, negative for malignancy (0/1), serially sectioned and multiple levels examined.   08-18-22 FINAL MICROSCOPIC DIAGNOSIS:  A. LEFT BREAST, LUMPECTOMY: Encapsulated papillary carcinoma (high nuclear grade) Adjacent high-grade ductal carcinoma in situ, cribriform type with necrosis Negative for invasive carcinoma Tumor measures 6.9 cm in greatest dimension DCIS/papillary carcinoma present in superior and anterior  margins and 0.1 mm from posterior margin Microcalcifications present within  DCIS and papillary carcinoma Changes consistent with prior biopsies (coil clip and ribbon clip) Prognostic markers (from report [id number]): Estrogen receptor positive; progesterone receptor positive  ONCOLOGY TABLE:  DCIS OF THE BREAST:  Resection Procedure: Lumpectomy Specimen Laterality: Left Histologic Type: Encapsulated papillary carcinoma with associated ductal carcinoma in situ Size of DCIS: 6.9 cm in greatest dimension (tumor involves 12 of 12 submitted blocks) Nuclear Grade: High nuclear grade Necrosis: Present Margins: DCIS present within the superior and anterior margins and 0.1 mm from posterior margin Regional Lymph Nodes: Not applicable (no lymph nodes submitted or found) Breast Biomarker Testing Performed on Previous Biopsy:      Testing performed on Case Number: [id number]      Estrogen Receptor: Positive (100%, strong staining)      Progesterone Receptor: Positive (95%, strong staining) Pathologic Stage Classification (pTNM, AJCC 8th Edition): pTis, pN n/a Representative Tumor Block: A8, A9 Comment(s): The tumor spans the entire specimen from biopsy clip to biopsy clip. (v4.4.0.0)  Receptor Status: ER(100%), PR (95%), Her2-neu (), Ki-67()   Did patient present with symptoms (if so, please note symptoms) or was this found on screening mammography?:  (Per Dr. Rosezena Sensor note on 08-18-22) Patient has a 1 year history of fullness left breast upper outer quadrant with tenderness. She had a clear nipple discharge dating back over a year but that is stopped.    Past/Anticipated interventions by surgeon, if any: 08-18-22 Preoperative diagnosis: Left breast DCIS upper outer quadrant   Postoperative diagnosis: Same   Procedure: Left breast seed localized lumpectomy using a bracketed approach with 2 seeds   Surgeon: Harriette Bouillon, MD   Mammography revealed an area measuring 7 cm encompassing 2 specific areas in the left breast upper outer quadrant. Core biopsies of  both showed DCIS intermediate to high-grade ER/PR positive. History of breast  cancer mother age 47 and prostate cancer in her father age 72    Assessment and Plan:   Diagnoses and all orders for this visit:  Ductal carcinoma in situ (DCIS) of left breast   Reviewed treatment options of breast conserving surgery versus mastectomy reconstruction. Of note her left breast smaller than right at baseline. She is interested in breast conserving surgery and understands there may be a mild cosmetic defect in the left depending on how she feels due to breast size. I contrasted this with reconstruction and mastectomy but she would like to proceed with left breast seed localized lumpectomy . Risk of bleeding, infection, cosmetic deformity, breast discrepancy, need for radiation therapy, cardiovascular events, the use of the seed, and the needs for other treatments and procedures depending on postop course as well as wound healing issues discussed.   09-21-22  Procedure: Reexcision left breast lumpectomy with left axillary sentinel lymph node mapping of deep left axillary sentinel node using mag trace   Surgeon: Harriette Bouillon, MD  Past/Anticipated interventions by medical oncology, if any:  Dr. Mosetta Putt on 07-26-22 ASSESSMENT & PLAN:    62 yo menopausal woman, presented with palpable left breast mass.     1. Left breast DCIS, grade 2, ER+ /PR+ -I discussed her breast imaging and needle biopsy results with patient and her family members in great detail. -She is a candidate for breast conservation surgery. She has been seen by breast surgeon Dr. Luisa Hart who recommends lumpectomy. -Given her strong family history of breast cancer in her mother at young age, we recommend her to undergo genetic testing to ruled out inheritable breast cancer -Her DCIS will be cured by complete surgical resection. Any form of adjuvant therapy is preventive. -Given her strongly positive/negative ER and PR, I do/not recommend  antiestrogen therapy with Tamoxifen, which decrease her risk of future breast cancer by ~40%.  -She will likely benefit from breast radiation if she undergo lumpectomy to decrease the risk of breast cancer. -I used [facility] DCIS calculator, her risk of future breast cancer will be 22% in the next 10 years with surgery alone, it would drop to 9-11% with radiation or antiestrogen therapy alone, and 4% if she undergo both radiation and antiestrogen therapy.  She is interested in both radiation and tamoxifen. -We also discussed that biopsy may have sampling limitation, we will review her surgical path, to see if she has any invasive carcinoma components. -We discussed breast cancer surveillance after she completes treatment, Including annual mammogram, breast exam every 6-12 months.  We also discussed screening breast MRI due to her high risk for future breast cancer.   Plan -She will likely have lumpectomy soon -Genetic refer -I'll see her at the end of radiation and finalize her antiestrogen therapy.       Lymphedema issues, if any:  {:18581} {t:21944}    Pain issues, if any:  {:18581} {PAIN DESCRIPTION:[id number]}   SAFETY ISSUES: Prior radiation? {:18581} Pacemaker/ICD? {:18581} Possible current pregnancy?no Is the patient on methotrexate? no   Current Complaints / other details:  ***   Genetic testing       Ductal carcinoma in situ (DCIS) of left breast  07/24/2022 Initial Diagnosis    Ductal carcinoma in situ (DCIS) of left breast    08/02/2022 Genetic Testing    Negative [facility] Common Hereditary Cancers +RNA Panel.  Report date is 08/03/2022.     The [facility] Common Hereditary Cancers + RNA Panel includes sequencing, deletion/duplication, and RNA analysis of the following  48 genes: APC, ATM, AXIN2, BAP1, BARD1, BMPR1A, BRCA1, BRCA2, BRIP1, CDH1, CDK4*, CDKN2A*, CHEK2, CTNNA1, DICER1, EPCAM* (del/dup only), FH, GREM1* (promoter dup analysis only), HOXB13*, KIT*, MBD4*, MEN1, MLH1, MSH2,  MSH3, MSH6, MUTYH, NF1, NTHL1, PALB2, PDGFRA*, PMS2, POLD1, POLE, PTEN, RAD51C, RAD51D, SDHA (sequencing only), SDHB, SDHC, SDHD, SMAD4, SMARCA4, STK11, TP53, TSC1, TSC2, VHL.  *Genes without RNA analysis.

## 2022-10-04 ENCOUNTER — Other Ambulatory Visit (HOSPITAL_COMMUNITY): Payer: Self-pay

## 2022-10-04 ENCOUNTER — Other Ambulatory Visit: Payer: Self-pay

## 2022-10-06 ENCOUNTER — Other Ambulatory Visit (HOSPITAL_COMMUNITY): Payer: Self-pay

## 2022-10-11 ENCOUNTER — Telehealth: Payer: Self-pay

## 2022-10-11 ENCOUNTER — Encounter: Payer: Self-pay | Admitting: Radiation Oncology

## 2022-10-11 NOTE — Progress Notes (Signed)
Radiation Oncology         [phone]) [phone] ________________________________  Name: Heidi Tran MRN: [id number]  Date: 10/12/2022  DOB: [birth year]  Re-Evaluation Note  CC: Heidi Andrew, NP  Heidi Mood, MD    ICD-10-CM   1. Ductal carcinoma in situ (DCIS) of left breast  D05.12       Diagnosis:   Stage 0 (cTis (DCIS), cN0, cM0) Left Breast, High-grade encapsulated papillary carcinoma with adjacent high-grade DCIS, ER+ / PR+ / Her2 not assessed: s/p lumpectomy   Narrative:  The patient returns today to discuss radiation treatment options. She was seen in the multidisciplinary breast clinic on 07/26/22.   On her consultation date, she underwent genetic testing. Results showed no clinically significant variants detected by [facility] genetic testing.   She opted to proceed with a left breast lumpectomy without nodal biopsies on 08/18/22 under the care of Dr. Luisa Tran. Pathology from the procedure revealed: tumor the size of 6.9 cm; histology of high grade encapsulated papillary carcinoma with adjacent high-grade DCIS, also with necrosis. DCIS present within the superior and anterior margins and 0.1  mm from posterior margin. Prognostic indicators significant for: estrogen receptor 100% positive and progesterone receptor 95% positive, both with strong staining intensity; Her2 not assessed.   She accordingly underwent re-excision of the positive margins and SLN evaluation on 09/21/22 with Dr. Luisa Tran. Pathology showed no evidence of carcinoma in all final margins. Nodal status of 4/4 left axillary sentinel lymph node excisions negative for carcinoma.   On review of systems, the patient reports doing well since her surgery. She denies pain within the breast or problems with swelling in her left arm or hand.      Allergies:  is allergic to statins, shellfish allergy, and sulfonamide derivatives.  Meds: Current Outpatient Medications  Medication Sig Dispense Refill   albuterol (PROVENTIL)  (2.5 MG/3ML) 0.083% nebulizer solution TAKE 3 MLS (2.5 MG TOTAL) BY NEBULIZATION EVERY 6 (SIX) HOURS AS NEEDED. 360 mL 3   albuterol (VENTOLIN HFA) 108 (90 Base) MCG/ACT inhaler Inhale 2 puffs into the lungs every 6 (six) hours as needed for wheezing or shortness of breath. 18 g 12   fish oil-omega-3 fatty acids 1000 MG capsule Take 1 g by mouth daily.     fluticasone-salmeterol (ADVAIR DISKUS) 250-50 MCG/ACT AEPB Inhale 1 puff into the lungs in the morning and at bedtime. 60 each 0   hydrocortisone cream 1 % Apply 1 application topically 2 (two) times daily. 30 g 3   ibuprofen (ADVIL) 800 MG tablet Take 1 tablet (800 mg total) by mouth every 8 (eight) hours as needed. 30 tablet 0   montelukast (SINGULAIR) 10 MG tablet Take 1 tablet (10 mg total) by mouth at bedtime. 30 tablet 3   Multiple Vitamin (MULTIVITAMIN) capsule Take 1 capsule by mouth daily. 30 capsule 6   naproxen (NAPROSYN) 500 MG tablet Take 1 tablet (500 mg total) by mouth 2 (two) times daily with a meal. 180 tablet 1   oxyCODONE (OXY IR/ROXICODONE) 5 MG immediate release tablet Take 1 tablet (5 mg total) by mouth every 6 (six) hours as needed for severe pain. 15 tablet 0   potassium chloride SA (KLOR-CON M) 20 MEQ tablet Take 1 tablet (20 mEq total) by mouth daily. 90 tablet 0   triamcinolone (KENALOG) 0.1 % Apply 1 application topically 2 (two) times daily. 30 g 3   triamterene-hydrochlorothiazide (MAXZIDE-25) 37.5-25 MG tablet Take 1 tablet by mouth daily. 90 tablet 1   Turmeric (  QC TUMERIC COMPLEX PO) Take by mouth.     No current facility-administered medications for this encounter.    Physical Findings: The patient is in no acute distress. Patient is alert and oriented.  weight is 171 lb (77.6 kg). Her temperature is 98.3 F (36.8 C). Her blood pressure is 155/90 (abnormal) and her pulse is 99. Her respiration is 18 and oxygen saturation is 99%.   Lungs are clear to auscultation bilaterally. Heart has regular rate and rhythm.  No palpable cervical, supraclavicular, or axillary adenopathy. Abdomen soft, non-tender, normal bowel sounds.  Left Breast: Well-healed lumpectomy scar.  A separate scar is noted in the axillary region which is also healed well.  Some swelling in this area but no significant seroma palpable.  Lab Findings: Lab Results  Component Value Date   WBC 7.4 07/26/2022   HGB 13.2 07/26/2022   HCT 39.0 07/26/2022   MCV 79.4 (L) 07/26/2022   PLT 353 07/26/2022    Radiographic Findings: No results found.  Impression:  Stage 0 (cTis (DCIS), cN0, cM0) Left Breast, High-grade encapsulated papillary carcinoma with adjacent high-grade DCIS, ER+ / PR+ / Her2 not assessed: s/p lumpectomy   She would be a good candidate for breast conserving therapy with radiation therapy as a component.  I discussed the overall course of treatment anticipated side effects and potential long-term toxicities of left sided breast radiation treatment.  The patient appears to understand and wishes to proceed with planned course of treatment.  Plan:  Patient is scheduled for CT simulation later today.  Treatments to begin in approximately a week.  She would be a good candidate for hypofractionated accelerated radiation therapy over approximately 4 weeks.  Will use cardiac sparing techniques if necessary.  -----------------------------------  Billie Lade, PhD, MD  This document serves as a record of services personally performed by Antony Blackbird, MD. It was created on his behalf by Neena Rhymes, a trained medical scribe. The creation of this record is based on the scribe's personal observations and the provider's statements to them. This document has been checked and approved by the attending provider.

## 2022-10-11 NOTE — Telephone Encounter (Signed)
Rn attempted to call pt to obtain meaningful use and nurse evaluation information without success. Voicemail was left for pt. Rn will call pt back this afternoon.

## 2022-10-11 NOTE — Telephone Encounter (Signed)
Rn called pt to obtain meaningful use and nurse evaluation information. Consult note complete and routed to Dr. Roselind Messier for review.

## 2022-10-12 ENCOUNTER — Ambulatory Visit
Admission: RE | Admit: 2022-10-12 | Discharge: 2022-10-12 | Disposition: A | Discharge status: Home / Self Care | Payer: Medicare Other | Source: Ambulatory Visit | Attending: Radiation Oncology | Admitting: Radiation Oncology

## 2022-10-12 ENCOUNTER — Other Ambulatory Visit: Payer: Self-pay

## 2022-10-12 VITALS — BP 155/90 | HR 99 | Temp 98.3°F | Resp 18 | Wt 171.0 lb

## 2022-10-12 DIAGNOSIS — Z79899 Other long term (current) drug therapy: Secondary | ICD-10-CM | POA: Insufficient documentation

## 2022-10-12 DIAGNOSIS — D0512 Intraductal carcinoma in situ of left breast: Secondary | ICD-10-CM

## 2022-10-12 DIAGNOSIS — Z510 Encounter for antineoplastic radiation therapy: Secondary | ICD-10-CM | POA: Insufficient documentation

## 2022-10-12 DIAGNOSIS — Z791 Long term (current) use of non-steroidal anti-inflammatories (NSAID): Secondary | ICD-10-CM | POA: Insufficient documentation

## 2022-10-12 DIAGNOSIS — Z170 Estrogen receptor positive status [ER+]: Secondary | ICD-10-CM | POA: Insufficient documentation

## 2022-10-12 DIAGNOSIS — Z7951 Long term (current) use of inhaled steroids: Secondary | ICD-10-CM | POA: Insufficient documentation

## 2022-10-16 ENCOUNTER — Other Ambulatory Visit: Payer: Self-pay

## 2022-10-18 ENCOUNTER — Encounter: Payer: Self-pay | Admitting: *Deleted

## 2022-10-18 DIAGNOSIS — D0512 Intraductal carcinoma in situ of left breast: Secondary | ICD-10-CM

## 2022-10-24 DIAGNOSIS — Z510 Encounter for antineoplastic radiation therapy: Secondary | ICD-10-CM | POA: Diagnosis not present

## 2022-10-26 ENCOUNTER — Telehealth: Payer: Self-pay | Admitting: Hematology

## 2022-10-26 NOTE — Telephone Encounter (Signed)
Attempted to call patient, line was busy. Will mail out calendar.

## 2022-10-27 ENCOUNTER — Other Ambulatory Visit: Payer: Self-pay | Admitting: Nurse Practitioner

## 2022-10-27 DIAGNOSIS — J4540 Moderate persistent asthma, uncomplicated: Secondary | ICD-10-CM

## 2022-10-28 ENCOUNTER — Other Ambulatory Visit (HOSPITAL_COMMUNITY): Payer: Self-pay

## 2022-10-30 ENCOUNTER — Other Ambulatory Visit: Payer: Self-pay

## 2022-10-30 ENCOUNTER — Ambulatory Visit
Admission: RE | Admit: 2022-10-30 | Discharge: 2022-10-30 | Disposition: A | Discharge status: Home / Self Care | Payer: Medicare Other | Source: Ambulatory Visit | Attending: Radiation Oncology | Admitting: Radiation Oncology

## 2022-10-30 ENCOUNTER — Other Ambulatory Visit (HOSPITAL_COMMUNITY): Payer: Self-pay

## 2022-10-30 DIAGNOSIS — Z510 Encounter for antineoplastic radiation therapy: Secondary | ICD-10-CM | POA: Diagnosis present

## 2022-10-30 DIAGNOSIS — D0512 Intraductal carcinoma in situ of left breast: Secondary | ICD-10-CM | POA: Insufficient documentation

## 2022-10-30 LAB — RAD ONC ARIA SESSION SUMMARY
Course Elapsed Days: 0
Plan Fractions Treated to Date: 1
Plan Prescribed Dose Per Fraction: 2.67 Gy
Plan Total Fractions Prescribed: 15
Plan Total Prescribed Dose: 40.05 Gy
Reference Point Dosage Given to Date: 2.67 Gy
Reference Point Session Dosage Given: 2.67 Gy
Session Number: 1

## 2022-10-30 MED ORDER — FLUTICASONE-SALMETEROL 250-50 MCG/ACT IN AEPB
1.0000 | INHALATION_SPRAY | Freq: Two times a day (BID) | RESPIRATORY_TRACT | 0 refills | Status: DC
Start: 2022-10-30 — End: 2022-11-27
  Filled 2022-10-30: qty 60, 30d supply, fill #0

## 2022-10-30 NOTE — Telephone Encounter (Signed)
Please advise KH 

## 2022-10-31 ENCOUNTER — Ambulatory Visit
Admission: RE | Admit: 2022-10-31 | Discharge: 2022-10-31 | Disposition: A | Discharge status: Home / Self Care | Payer: Medicare Other | Source: Ambulatory Visit | Attending: Radiation Oncology | Admitting: Radiation Oncology

## 2022-10-31 ENCOUNTER — Other Ambulatory Visit: Payer: Self-pay

## 2022-10-31 DIAGNOSIS — Z510 Encounter for antineoplastic radiation therapy: Secondary | ICD-10-CM | POA: Diagnosis not present

## 2022-10-31 DIAGNOSIS — D0512 Intraductal carcinoma in situ of left breast: Secondary | ICD-10-CM

## 2022-10-31 LAB — RAD ONC ARIA SESSION SUMMARY
Course Elapsed Days: 1
Plan Fractions Treated to Date: 2
Plan Prescribed Dose Per Fraction: 2.67 Gy
Plan Total Fractions Prescribed: 15
Plan Total Prescribed Dose: 40.05 Gy
Reference Point Dosage Given to Date: 5.34 Gy
Reference Point Session Dosage Given: 2.67 Gy
Session Number: 2

## 2022-10-31 MED ORDER — RADIAPLEXRX EX GEL: Freq: Once | CUTANEOUS | Status: AC

## 2022-11-01 ENCOUNTER — Ambulatory Visit
Admission: RE | Admit: 2022-11-01 | Discharge: 2022-11-01 | Disposition: A | Discharge status: Home / Self Care | Payer: Medicare Other | Source: Ambulatory Visit | Attending: Radiation Oncology | Admitting: Radiation Oncology

## 2022-11-01 ENCOUNTER — Other Ambulatory Visit: Payer: Self-pay

## 2022-11-01 DIAGNOSIS — Z510 Encounter for antineoplastic radiation therapy: Secondary | ICD-10-CM | POA: Diagnosis not present

## 2022-11-01 LAB — RAD ONC ARIA SESSION SUMMARY
Course Elapsed Days: 2
Plan Fractions Treated to Date: 3
Plan Prescribed Dose Per Fraction: 2.67 Gy
Plan Total Fractions Prescribed: 15
Plan Total Prescribed Dose: 40.05 Gy
Reference Point Dosage Given to Date: 8.01 Gy
Reference Point Session Dosage Given: 2.67 Gy
Session Number: 3

## 2022-11-02 ENCOUNTER — Ambulatory Visit
Admission: RE | Admit: 2022-11-02 | Discharge: 2022-11-02 | Disposition: A | Discharge status: Home / Self Care | Payer: Medicare Other | Source: Ambulatory Visit | Attending: Radiation Oncology | Admitting: Radiation Oncology

## 2022-11-02 ENCOUNTER — Other Ambulatory Visit: Payer: Self-pay

## 2022-11-02 DIAGNOSIS — Z510 Encounter for antineoplastic radiation therapy: Secondary | ICD-10-CM | POA: Diagnosis not present

## 2022-11-02 LAB — RAD ONC ARIA SESSION SUMMARY
Course Elapsed Days: 3
Plan Fractions Treated to Date: 4
Plan Prescribed Dose Per Fraction: 2.67 Gy
Plan Total Fractions Prescribed: 15
Plan Total Prescribed Dose: 40.05 Gy
Reference Point Dosage Given to Date: 10.68 Gy
Reference Point Session Dosage Given: 2.67 Gy
Session Number: 4

## 2022-11-03 ENCOUNTER — Other Ambulatory Visit: Payer: Self-pay

## 2022-11-03 ENCOUNTER — Ambulatory Visit: Payer: Self-pay | Admitting: Nurse Practitioner

## 2022-11-03 ENCOUNTER — Ambulatory Visit
Admission: RE | Admit: 2022-11-03 | Discharge: 2022-11-03 | Disposition: A | Discharge status: Home / Self Care | Payer: Medicare Other | Source: Ambulatory Visit | Attending: Radiation Oncology | Admitting: Radiation Oncology

## 2022-11-03 DIAGNOSIS — Z510 Encounter for antineoplastic radiation therapy: Secondary | ICD-10-CM | POA: Diagnosis not present

## 2022-11-03 LAB — RAD ONC ARIA SESSION SUMMARY
Course Elapsed Days: 4
Plan Fractions Treated to Date: 5
Plan Prescribed Dose Per Fraction: 2.67 Gy
Plan Total Fractions Prescribed: 15
Plan Total Prescribed Dose: 40.05 Gy
Reference Point Dosage Given to Date: 13.35 Gy
Reference Point Session Dosage Given: 2.67 Gy
Session Number: 5

## 2022-11-06 ENCOUNTER — Encounter: Payer: Self-pay | Admitting: Nurse Practitioner

## 2022-11-06 ENCOUNTER — Other Ambulatory Visit: Payer: Self-pay

## 2022-11-06 ENCOUNTER — Ambulatory Visit
Admission: RE | Admit: 2022-11-06 | Discharge: 2022-11-06 | Disposition: A | Discharge status: Home / Self Care | Payer: Medicare Other | Source: Ambulatory Visit | Attending: Radiation Oncology | Admitting: Radiation Oncology

## 2022-11-06 ENCOUNTER — Other Ambulatory Visit (HOSPITAL_COMMUNITY): Payer: Self-pay

## 2022-11-06 ENCOUNTER — Other Ambulatory Visit: Payer: Self-pay | Admitting: Nurse Practitioner

## 2022-11-06 ENCOUNTER — Ambulatory Visit (INDEPENDENT_AMBULATORY_CARE_PROVIDER_SITE_OTHER): Payer: Medicare Other | Admitting: Nurse Practitioner

## 2022-11-06 VITALS — BP 136/78 | HR 91 | Temp 97.2°F | Wt 172.8 lb

## 2022-11-06 DIAGNOSIS — Z13220 Encounter for screening for lipoid disorders: Secondary | ICD-10-CM | POA: Diagnosis not present

## 2022-11-06 DIAGNOSIS — R7303 Prediabetes: Secondary | ICD-10-CM | POA: Diagnosis not present

## 2022-11-06 DIAGNOSIS — I10 Essential (primary) hypertension: Secondary | ICD-10-CM

## 2022-11-06 DIAGNOSIS — G720 Drug-induced myopathy: Secondary | ICD-10-CM

## 2022-11-06 DIAGNOSIS — Z510 Encounter for antineoplastic radiation therapy: Secondary | ICD-10-CM | POA: Diagnosis not present

## 2022-11-06 DIAGNOSIS — T466X5A Adverse effect of antihyperlipidemic and antiarteriosclerotic drugs, initial encounter: Secondary | ICD-10-CM

## 2022-11-06 DIAGNOSIS — Z789 Other specified health status: Secondary | ICD-10-CM | POA: Insufficient documentation

## 2022-11-06 LAB — RAD ONC ARIA SESSION SUMMARY
Course Elapsed Days: 7
Plan Fractions Treated to Date: 6
Plan Prescribed Dose Per Fraction: 2.67 Gy
Plan Total Fractions Prescribed: 15
Plan Total Prescribed Dose: 40.05 Gy
Reference Point Dosage Given to Date: 16.02 Gy
Reference Point Session Dosage Given: 2.67 Gy
Session Number: 6

## 2022-11-06 MED ORDER — ALBUTEROL SULFATE (2.5 MG/3ML) 0.083% IN NEBU
3.0000 mL | INHALATION_SOLUTION | Freq: Four times a day (QID) | RESPIRATORY_TRACT | 3 refills | Status: AC
  Filled 2022-11-06: qty 360, 30d supply, fill #0
  Filled 2022-11-06: qty 360, fill #0
  Filled 2022-12-08: qty 360, 30d supply, fill #1

## 2022-11-06 NOTE — Progress Notes (Signed)
@Patient  ID: Heidi Tran, female    DOB: [birth year], 62 y.o.   MRN: [id number]  Chief Complaint  Patient presents with   Follow-up    Over all health     Referring provider: Ivonne Andrew, NP   HPI  Heidi Tran 63 y.o. female  has a past medical history of Allergic rhinitis, cause unspecified, Asthma, Hypercholesteremia (12/2019), Hypertension, Shoulder strain, left, initial encounter (04/2019), Unspecified essential hypertension, and Vitamin D deficiency.   Hypertension:   Patient presents today for hypertension. She is compliant with taking Maxide. Denies f/c/s, n/v/d, hemoptysis, PND, leg swelling Denies chest pain or edema  Recently had lumpectomy and now having radiation treatments. Follows with oncology.   Has not followed up with GI for positive colon cancer screening. Wants to finish with breast treatment first.     Allergies  Allergen Reactions   Statins Other (See Comments)    Leg cramps   Shellfish Allergy Swelling    Throat, lips and tongue swelling   Sulfonamide Derivatives     REACTION: rash    Immunization History  Administered Date(s) Administered   Influenza Split 02/17/2011, 03/12/2012, 01/27/2013, 02/26/2014   Influenza Whole 02/26/2009, 02/26/2010   Influenza,inj,Quad PF,6+ Mos 07/17/2017, 02/18/2018, 01/31/2019, 03/18/2020, 03/24/2021, 05/04/2022   PFIZER(Purple Top)SARS-COV-2 Vaccination 08/19/2019, 09/16/2019, 04/26/2020   Pneumococcal Polysaccharide-23 05/29/2006    Past Medical History:  Diagnosis Date   Allergic rhinitis, cause unspecified    Asthma    Hypercholesteremia 12/2019   Hypertension    Shoulder strain, left, initial encounter 04/2019   Unspecified essential hypertension    Vitamin D deficiency     Tobacco History: Social History   Tobacco Use  Smoking Status Never  Smokeless Tobacco Never   Counseling given: Not Answered   Outpatient Encounter Medications as of 11/06/2022  Medication Sig   albuterol  (VENTOLIN HFA) 108 (90 Base) MCG/ACT inhaler Inhale 2 puffs into the lungs every 6 (six) hours as needed for wheezing or shortness of breath.   chlorhexidine (PERIDEX) 0.12 % solution SMARTSIG:By Mouth   fish oil-omega-3 fatty acids 1000 MG capsule Take 1 g by mouth daily.   fluticasone-salmeterol (ADVAIR DISKUS) 250-50 MCG/ACT AEPB Inhale 1 puff into the lungs in the morning and at bedtime.   hydrocortisone cream 1 % Apply 1 application topically 2 (two) times daily.   ibuprofen (ADVIL) 800 MG tablet Take 1 tablet (800 mg total) by mouth every 8 (eight) hours as needed.   montelukast (SINGULAIR) 10 MG tablet Take 1 tablet (10 mg total) by mouth at bedtime.   Multiple Vitamin (MULTIVITAMIN) capsule Take 1 capsule by mouth daily.   naproxen (NAPROSYN) 500 MG tablet Take 1 tablet (500 mg total) by mouth 2 (two) times daily with a meal.   potassium chloride SA (KLOR-CON M) 20 MEQ tablet Take 1 tablet (20 mEq total) by mouth daily.   triamcinolone (KENALOG) 0.1 % Apply 1 application topically 2 (two) times daily.   triamterene-hydrochlorothiazide (MAXZIDE-25) 37.5-25 MG tablet Take 1 tablet by mouth daily.   Turmeric (QC TUMERIC COMPLEX PO) Take by mouth.   [DISCONTINUED] albuterol (PROVENTIL) (2.5 MG/3ML) 0.083% nebulizer solution TAKE 3 MLS (2.5 MG TOTAL) BY NEBULIZATION EVERY 6 (SIX) HOURS AS NEEDED.   albuterol (PROVENTIL) (2.5 MG/3ML) 0.083% nebulizer solution TAKE 3 MLS (2.5 MG TOTAL) BY NEBULIZATION EVERY 6 (SIX) HOURS AS NEEDED.   oxyCODONE (OXY IR/ROXICODONE) 5 MG immediate release tablet Take 1 tablet (5 mg total) by mouth every 6 (six) hours as needed for  severe pain. (Patient not taking: Reported on 11/06/2022)   No facility-administered encounter medications on file as of 11/06/2022.     Review of Systems  Review of Systems  Constitutional: Negative.   HENT: Negative.    Cardiovascular: Negative.   Gastrointestinal: Negative.   Allergic/Immunologic: Negative.   Neurological:  Negative.   Psychiatric/Behavioral: Negative.         Physical Exam  BP 136/78   Pulse 91   Temp (!) 97.2 F (36.2 C)   Wt 172 lb 12.8 oz (78.4 kg)   SpO2 100%   BMI 30.61 kg/m   Wt Readings from Last 5 Encounters:  11/06/22 172 lb 12.8 oz (78.4 kg)  10/12/22 171 lb (77.6 kg)  09/21/22 168 lb 10.4 oz (76.5 kg)  08/18/22 167 lb 15.9 oz (76.2 kg)  08/03/22 167 lb 12.8 oz (76.1 kg)     Physical Exam Vitals and nursing note reviewed.  Constitutional:      General: She is not in acute distress.    Appearance: She is well-developed.  Cardiovascular:     Rate and Rhythm: Normal rate and regular rhythm.  Pulmonary:     Effort: Pulmonary effort is normal.     Breath sounds: Normal breath sounds.  Neurological:     Mental Status: She is alert and oriented to person, place, and time.      Lab Results:  CBC    Component Value Date/Time   WBC 7.4 07/26/2022 1208   WBC 7.7 09/30/2017 1040   RBC 4.91 07/26/2022 1208   HGB 13.2 07/26/2022 1208   HGB 12.9 02/01/2022 1203   HCT 39.0 07/26/2022 1208   HCT 39.3 02/01/2022 1203   PLT 353 07/26/2022 1208   PLT 311 02/01/2022 1203   MCV 79.4 (L) 07/26/2022 1208   MCV 81 02/01/2022 1203   MCH 26.9 07/26/2022 1208   MCHC 33.8 07/26/2022 1208   RDW 13.9 07/26/2022 1208   RDW 13.6 02/01/2022 1203   LYMPHSABS 2.5 07/26/2022 1208   LYMPHSABS 2.1 01/21/2020 1222   MONOABS 0.6 07/26/2022 1208   EOSABS 0.2 07/26/2022 1208   EOSABS 0.1 01/21/2020 1222   BASOSABS 0.1 07/26/2022 1208   BASOSABS 0.1 01/21/2020 1222    BMET    Component Value Date/Time   NA 138 09/15/2022 1200   NA 140 08/03/2022 1036   K 3.7 09/15/2022 1200   CL 105 09/15/2022 1200   CO2 25 09/15/2022 1200   GLUCOSE 89 09/15/2022 1200   BUN 5 (L) 09/15/2022 1200   BUN 8 08/03/2022 1036   CREATININE 0.87 09/15/2022 1200   CREATININE 0.81 07/26/2022 1208   CALCIUM 9.5 09/15/2022 1200   GFRNONAA >60 09/15/2022 1200   GFRNONAA >60 07/26/2022 1208    GFRAA 92 01/21/2020 1222    BNP No results found for: "BNP"  ProBNP    Component Value Date/Time   PROBNP 22.0 07/13/2008 1103      Assessment & Plan:   Essential hypertension - CBC - Comprehensive metabolic panel   2. Lipid screening  - Lipid Panel   3. Prediabetes  - Hemoglobin A1c   4. Statin intolerance   Follow up:  Follow up in 3 months     Ivonne Andrew, NP 11/06/2022

## 2022-11-06 NOTE — Assessment & Plan Note (Signed)
-   CBC - Comprehensive metabolic panel   2. Lipid screening  - Lipid Panel   3. Prediabetes  - Hemoglobin A1c   4. Statin intolerance   Follow up:  Follow up in 3 months

## 2022-11-06 NOTE — Patient Instructions (Addendum)
1. Essential hypertension  - CBC - Comprehensive metabolic panel   2. Lipid screening  - Lipid Panel   3. Prediabetes  - Hemoglobin A1c   4. Statin intolerance   Follow up:  Follow up in 3 months

## 2022-11-07 ENCOUNTER — Other Ambulatory Visit: Payer: Self-pay

## 2022-11-07 ENCOUNTER — Ambulatory Visit
Admission: RE | Admit: 2022-11-07 | Discharge: 2022-11-07 | Disposition: A | Discharge status: Home / Self Care | Payer: Medicare Other | Source: Ambulatory Visit | Attending: Radiation Oncology | Admitting: Radiation Oncology

## 2022-11-07 DIAGNOSIS — Z510 Encounter for antineoplastic radiation therapy: Secondary | ICD-10-CM | POA: Diagnosis not present

## 2022-11-07 LAB — LIPID PANEL
Chol/HDL Ratio: 5.7 ratio — ABNORMAL HIGH (ref 0.0–4.4)
Cholesterol, Total: 277 mg/dL — ABNORMAL HIGH (ref 100–199)
HDL: 49 mg/dL (ref >39)
LDL Chol Calc (NIH): 189 mg/dL — ABNORMAL HIGH (ref 0–99)
Triglycerides: 207 mg/dL — ABNORMAL HIGH (ref 0–149)
VLDL Cholesterol Cal: 39 mg/dL (ref 5–40)

## 2022-11-07 LAB — COMPREHENSIVE METABOLIC PANEL
ALT: 15 IU/L (ref 0–32)
AST: 14 IU/L (ref 0–40)
Albumin/Globulin Ratio: 1.7
Albumin: 4.5 g/dL (ref 3.9–4.9)
Alkaline Phosphatase: 100 IU/L (ref 44–121)
BUN/Creatinine Ratio: 20 (ref 12–28)
BUN: 17 mg/dL (ref 8–27)
Bilirubin Total: 0.2 mg/dL (ref 0.0–1.2)
CO2: 24 mmol/L (ref 20–29)
Calcium: 9.8 mg/dL (ref 8.7–10.3)
Chloride: 100 mmol/L (ref 96–106)
Creatinine, Ser: 0.84 mg/dL (ref 0.57–1.00)
Globulin, Total: 2.6 g/dL (ref 1.5–4.5)
Glucose: 129 mg/dL — ABNORMAL HIGH (ref 70–99)
Potassium: 3.7 mmol/L (ref 3.5–5.2)
Sodium: 138 mmol/L (ref 134–144)
Total Protein: 7.1 g/dL (ref 6.0–8.5)
eGFR: 78 mL/min/{1.73_m2} (ref >59)

## 2022-11-07 LAB — CBC
Hematocrit: 36.6 % (ref 34.0–46.6)
Hemoglobin: 11.7 g/dL (ref 11.1–15.9)
MCH: 26.1 pg — ABNORMAL LOW (ref 26.6–33.0)
MCHC: 32 g/dL (ref 31.5–35.7)
MCV: 82 fL (ref 79–97)
Platelets: 323 10*3/uL (ref 150–450)
RBC: 4.49 x10E6/uL (ref 3.77–5.28)
RDW: 13.9 % (ref 11.7–15.4)
WBC: 5.8 10*3/uL (ref 3.4–10.8)

## 2022-11-07 LAB — RAD ONC ARIA SESSION SUMMARY
Course Elapsed Days: 8
Plan Fractions Treated to Date: 7
Plan Prescribed Dose Per Fraction: 2.67 Gy
Plan Total Fractions Prescribed: 15
Plan Total Prescribed Dose: 40.05 Gy
Reference Point Dosage Given to Date: 18.69 Gy
Reference Point Session Dosage Given: 2.67 Gy
Session Number: 7

## 2022-11-07 LAB — HEMOGLOBIN A1C
Est. average glucose Bld gHb Est-mCnc: 140 mg/dL
Hgb A1c MFr Bld: 6.5 % — ABNORMAL HIGH (ref 4.8–5.6)

## 2022-11-08 ENCOUNTER — Other Ambulatory Visit: Payer: Self-pay

## 2022-11-08 ENCOUNTER — Ambulatory Visit
Admission: RE | Admit: 2022-11-08 | Discharge: 2022-11-08 | Disposition: A | Discharge status: Home / Self Care | Payer: Medicare Other | Source: Ambulatory Visit | Attending: Radiation Oncology | Admitting: Radiation Oncology

## 2022-11-08 ENCOUNTER — Other Ambulatory Visit: Payer: Self-pay | Admitting: Nurse Practitioner

## 2022-11-08 DIAGNOSIS — Z510 Encounter for antineoplastic radiation therapy: Secondary | ICD-10-CM | POA: Diagnosis not present

## 2022-11-08 DIAGNOSIS — E7800 Pure hypercholesterolemia, unspecified: Secondary | ICD-10-CM

## 2022-11-08 DIAGNOSIS — Z789 Other specified health status: Secondary | ICD-10-CM

## 2022-11-08 LAB — RAD ONC ARIA SESSION SUMMARY
Course Elapsed Days: 9
Plan Fractions Treated to Date: 8
Plan Prescribed Dose Per Fraction: 2.67 Gy
Plan Total Fractions Prescribed: 15
Plan Total Prescribed Dose: 40.05 Gy
Reference Point Dosage Given to Date: 21.36 Gy
Reference Point Session Dosage Given: 2.67 Gy
Session Number: 8

## 2022-11-09 ENCOUNTER — Other Ambulatory Visit: Payer: Self-pay

## 2022-11-09 ENCOUNTER — Ambulatory Visit
Admission: RE | Admit: 2022-11-09 | Discharge: 2022-11-09 | Disposition: A | Discharge status: Home / Self Care | Payer: Medicare Other | Source: Ambulatory Visit | Attending: Radiation Oncology | Admitting: Radiation Oncology

## 2022-11-09 DIAGNOSIS — Z510 Encounter for antineoplastic radiation therapy: Secondary | ICD-10-CM | POA: Diagnosis not present

## 2022-11-09 LAB — RAD ONC ARIA SESSION SUMMARY
Course Elapsed Days: 10
Plan Fractions Treated to Date: 9
Plan Prescribed Dose Per Fraction: 2.67 Gy
Plan Total Fractions Prescribed: 15
Plan Total Prescribed Dose: 40.05 Gy
Reference Point Dosage Given to Date: 24.03 Gy
Reference Point Session Dosage Given: 2.67 Gy
Session Number: 9

## 2022-11-10 ENCOUNTER — Ambulatory Visit
Admission: RE | Admit: 2022-11-10 | Discharge: 2022-11-10 | Disposition: A | Discharge status: Home / Self Care | Payer: Medicare Other | Source: Ambulatory Visit | Attending: Radiation Oncology | Admitting: Radiation Oncology

## 2022-11-10 ENCOUNTER — Other Ambulatory Visit: Payer: Self-pay

## 2022-11-10 DIAGNOSIS — D0512 Intraductal carcinoma in situ of left breast: Secondary | ICD-10-CM

## 2022-11-10 DIAGNOSIS — Z510 Encounter for antineoplastic radiation therapy: Secondary | ICD-10-CM | POA: Diagnosis not present

## 2022-11-10 LAB — RAD ONC ARIA SESSION SUMMARY
Course Elapsed Days: 11
Plan Fractions Treated to Date: 10
Plan Prescribed Dose Per Fraction: 2.67 Gy
Plan Total Fractions Prescribed: 15
Plan Total Prescribed Dose: 40.05 Gy
Reference Point Dosage Given to Date: 26.7 Gy
Reference Point Session Dosage Given: 2.67 Gy
Session Number: 10

## 2022-11-10 MED ORDER — RADIAPLEXRX EX GEL: Freq: Once | CUTANEOUS | Status: AC

## 2022-11-13 ENCOUNTER — Ambulatory Visit
Admission: RE | Admit: 2022-11-13 | Discharge: 2022-11-13 | Disposition: A | Discharge status: Home / Self Care | Payer: Medicare Other | Source: Ambulatory Visit | Attending: Radiation Oncology | Admitting: Radiation Oncology

## 2022-11-13 ENCOUNTER — Other Ambulatory Visit: Payer: Self-pay

## 2022-11-13 DIAGNOSIS — Z510 Encounter for antineoplastic radiation therapy: Secondary | ICD-10-CM | POA: Diagnosis not present

## 2022-11-13 LAB — RAD ONC ARIA SESSION SUMMARY
Course Elapsed Days: 14
Plan Fractions Treated to Date: 11
Plan Prescribed Dose Per Fraction: 2.67 Gy
Plan Total Fractions Prescribed: 15
Plan Total Prescribed Dose: 40.05 Gy
Reference Point Dosage Given to Date: 29.37 Gy
Reference Point Session Dosage Given: 2.67 Gy
Session Number: 11

## 2022-11-14 ENCOUNTER — Telehealth: Payer: Self-pay

## 2022-11-14 ENCOUNTER — Ambulatory Visit
Admission: RE | Admit: 2022-11-14 | Discharge: 2022-11-14 | Disposition: A | Discharge status: Home / Self Care | Payer: Medicare Other | Source: Ambulatory Visit | Attending: Radiation Oncology | Admitting: Radiation Oncology

## 2022-11-14 ENCOUNTER — Other Ambulatory Visit: Payer: Self-pay

## 2022-11-14 ENCOUNTER — Other Ambulatory Visit (HOSPITAL_COMMUNITY): Payer: Self-pay

## 2022-11-14 ENCOUNTER — Ambulatory Visit: Payer: Medicare Other | Admitting: Radiation Oncology

## 2022-11-14 DIAGNOSIS — Z510 Encounter for antineoplastic radiation therapy: Secondary | ICD-10-CM | POA: Diagnosis not present

## 2022-11-14 LAB — RAD ONC ARIA SESSION SUMMARY
Course Elapsed Days: 15
Plan Fractions Treated to Date: 12
Plan Prescribed Dose Per Fraction: 2.67 Gy
Plan Total Fractions Prescribed: 15
Plan Total Prescribed Dose: 40.05 Gy
Reference Point Dosage Given to Date: 32.04 Gy
Reference Point Session Dosage Given: 2.67 Gy
Session Number: 12

## 2022-11-14 NOTE — Progress Notes (Unsigned)
   Care Guide Note  11/14/2022 Name: Heidi Tran MRN: [id number] DOB: [birth year]  Referred by: Ivonne Andrew, NP Reason for referral : Care Coordination (Outreach to schedule with Pharm d in July )   Heidi Tran is a 62 y.o. year old female who is a primary care patient of Ivonne Andrew, NP. Heidi Tran was referred to the pharmacist for assistance related to HLD.    An unsuccessful telephone outreach was attempted today to contact the patient who was referred to the pharmacy team for assistance with medication management. Additional attempts will be made to contact the patient.   Heidi Tran, RMA Care Guide [facility]  [place], [place] [address] Direct Dial: [phone] [name]

## 2022-11-15 ENCOUNTER — Other Ambulatory Visit: Payer: Self-pay

## 2022-11-15 ENCOUNTER — Ambulatory Visit
Admission: RE | Admit: 2022-11-15 | Discharge: 2022-11-15 | Disposition: A | Discharge status: Home / Self Care | Payer: Medicare Other | Source: Ambulatory Visit | Attending: Radiation Oncology | Admitting: Radiation Oncology

## 2022-11-15 DIAGNOSIS — Z510 Encounter for antineoplastic radiation therapy: Secondary | ICD-10-CM | POA: Diagnosis not present

## 2022-11-15 LAB — RAD ONC ARIA SESSION SUMMARY
Course Elapsed Days: 16
Plan Fractions Treated to Date: 13
Plan Prescribed Dose Per Fraction: 2.67 Gy
Plan Total Fractions Prescribed: 15
Plan Total Prescribed Dose: 40.05 Gy
Reference Point Dosage Given to Date: 34.71 Gy
Reference Point Session Dosage Given: 2.67 Gy
Session Number: 13

## 2022-11-16 ENCOUNTER — Ambulatory Visit
Admission: RE | Admit: 2022-11-16 | Discharge: 2022-11-16 | Disposition: A | Discharge status: Home / Self Care | Payer: Medicare Other | Source: Ambulatory Visit | Attending: Radiation Oncology | Admitting: Radiation Oncology

## 2022-11-16 ENCOUNTER — Other Ambulatory Visit: Payer: Self-pay

## 2022-11-16 DIAGNOSIS — Z510 Encounter for antineoplastic radiation therapy: Secondary | ICD-10-CM | POA: Diagnosis not present

## 2022-11-16 LAB — RAD ONC ARIA SESSION SUMMARY
Course Elapsed Days: 17
Plan Fractions Treated to Date: 14
Plan Prescribed Dose Per Fraction: 2.67 Gy
Plan Total Fractions Prescribed: 15
Plan Total Prescribed Dose: 40.05 Gy
Reference Point Dosage Given to Date: 37.38 Gy
Reference Point Session Dosage Given: 2.67 Gy
Session Number: 14

## 2022-11-16 NOTE — Progress Notes (Signed)
   Care Guide Note  11/16/2022 Name: [name] MRN: [id number] DOB: [birth year]  Referred by: Ivonne Andrew, NP Reason for referral : Care Coordination (Outreach to schedule with Pharm d in July )   Heidi Tran is a 62 y.o. year old female who is a primary care patient of Ivonne Andrew, NP. Jahayra Dethlefs was referred to the pharmacist for assistance related to DM.    Successful contact was made with the patient to discuss pharmacy services including being ready for the pharmacist to call at least 5 minutes before the scheduled appointment time, to have medication bottles and any blood sugar or blood pressure readings ready for review. The patient agreed to meet with the pharmacist via with the pharmacist via telephone visit on (date/time).  12/26/2022  Penne Lash, RMA Care Guide [facility]  [place], [place] [address] Direct Dial: [phone] [email or web address]

## 2022-11-17 ENCOUNTER — Other Ambulatory Visit: Payer: Self-pay

## 2022-11-17 ENCOUNTER — Ambulatory Visit
Admission: RE | Admit: 2022-11-17 | Discharge: 2022-11-17 | Disposition: A | Discharge status: Home / Self Care | Payer: Medicare Other | Source: Ambulatory Visit | Attending: Radiation Oncology | Admitting: Radiation Oncology

## 2022-11-17 DIAGNOSIS — Z510 Encounter for antineoplastic radiation therapy: Secondary | ICD-10-CM | POA: Diagnosis not present

## 2022-11-17 LAB — RAD ONC ARIA SESSION SUMMARY
Course Elapsed Days: 18
Plan Fractions Treated to Date: 15
Plan Prescribed Dose Per Fraction: 2.67 Gy
Plan Total Fractions Prescribed: 15
Plan Total Prescribed Dose: 40.05 Gy
Reference Point Dosage Given to Date: 40.05 Gy
Reference Point Session Dosage Given: 1.0735 Gy
Session Number: 15

## 2022-11-20 ENCOUNTER — Ambulatory Visit
Admission: RE | Admit: 2022-11-20 | Discharge: 2022-11-20 | Disposition: A | Discharge status: Home / Self Care | Payer: Medicare Other | Source: Ambulatory Visit | Attending: Radiation Oncology | Admitting: Radiation Oncology

## 2022-11-20 ENCOUNTER — Other Ambulatory Visit: Payer: Self-pay

## 2022-11-20 DIAGNOSIS — D0512 Intraductal carcinoma in situ of left breast: Secondary | ICD-10-CM

## 2022-11-20 DIAGNOSIS — Z510 Encounter for antineoplastic radiation therapy: Secondary | ICD-10-CM | POA: Diagnosis not present

## 2022-11-20 LAB — RAD ONC ARIA SESSION SUMMARY
Course Elapsed Days: 21
Plan Fractions Treated to Date: 1
Plan Prescribed Dose Per Fraction: 2 Gy
Plan Total Fractions Prescribed: 5
Plan Total Prescribed Dose: 10 Gy
Reference Point Dosage Given to Date: 2 Gy
Reference Point Session Dosage Given: 2 Gy
Session Number: 16

## 2022-11-21 ENCOUNTER — Ambulatory Visit
Admission: RE | Admit: 2022-11-21 | Discharge: 2022-11-21 | Disposition: A | Discharge status: Home / Self Care | Payer: Medicare Other | Source: Ambulatory Visit | Attending: Radiation Oncology | Admitting: Radiation Oncology

## 2022-11-21 ENCOUNTER — Other Ambulatory Visit: Payer: Self-pay

## 2022-11-21 DIAGNOSIS — D0512 Intraductal carcinoma in situ of left breast: Secondary | ICD-10-CM

## 2022-11-21 DIAGNOSIS — Z510 Encounter for antineoplastic radiation therapy: Secondary | ICD-10-CM | POA: Diagnosis not present

## 2022-11-21 LAB — RAD ONC ARIA SESSION SUMMARY
Course Elapsed Days: 22
Plan Fractions Treated to Date: 2
Plan Prescribed Dose Per Fraction: 2 Gy
Plan Total Fractions Prescribed: 5
Plan Total Prescribed Dose: 10 Gy
Reference Point Dosage Given to Date: 4 Gy
Reference Point Session Dosage Given: 2 Gy
Session Number: 17

## 2022-11-21 MED ORDER — RADIAPLEXRX EX GEL: Freq: Once | CUTANEOUS | Status: AC

## 2022-11-22 ENCOUNTER — Other Ambulatory Visit: Payer: Self-pay

## 2022-11-22 ENCOUNTER — Ambulatory Visit
Admission: RE | Admit: 2022-11-22 | Discharge: 2022-11-22 | Disposition: A | Discharge status: Home / Self Care | Payer: Medicare Other | Source: Ambulatory Visit | Attending: Radiation Oncology | Admitting: Radiation Oncology

## 2022-11-22 DIAGNOSIS — Z510 Encounter for antineoplastic radiation therapy: Secondary | ICD-10-CM | POA: Diagnosis not present

## 2022-11-22 LAB — RAD ONC ARIA SESSION SUMMARY
Course Elapsed Days: 23
Plan Fractions Treated to Date: 3
Plan Prescribed Dose Per Fraction: 2 Gy
Plan Total Fractions Prescribed: 5
Plan Total Prescribed Dose: 10 Gy
Reference Point Dosage Given to Date: 6 Gy
Reference Point Session Dosage Given: 2 Gy
Session Number: 18

## 2022-11-23 ENCOUNTER — Other Ambulatory Visit: Payer: Self-pay

## 2022-11-23 ENCOUNTER — Ambulatory Visit
Admission: RE | Admit: 2022-11-23 | Discharge: 2022-11-23 | Disposition: A | Discharge status: Home / Self Care | Payer: Medicare Other | Source: Ambulatory Visit | Attending: Radiation Oncology | Admitting: Radiation Oncology

## 2022-11-23 DIAGNOSIS — Z510 Encounter for antineoplastic radiation therapy: Secondary | ICD-10-CM | POA: Diagnosis not present

## 2022-11-23 LAB — RAD ONC ARIA SESSION SUMMARY
Course Elapsed Days: 24
Plan Fractions Treated to Date: 4
Plan Prescribed Dose Per Fraction: 2 Gy
Plan Total Fractions Prescribed: 5
Plan Total Prescribed Dose: 10 Gy
Reference Point Dosage Given to Date: 8 Gy
Reference Point Session Dosage Given: 2 Gy
Session Number: 19

## 2022-11-24 ENCOUNTER — Other Ambulatory Visit: Payer: Self-pay

## 2022-11-24 ENCOUNTER — Ambulatory Visit
Admission: RE | Admit: 2022-11-24 | Discharge: 2022-11-24 | Disposition: A | Discharge status: Home / Self Care | Payer: Medicare Other | Source: Ambulatory Visit | Attending: Radiation Oncology | Admitting: Radiation Oncology

## 2022-11-24 DIAGNOSIS — Z510 Encounter for antineoplastic radiation therapy: Secondary | ICD-10-CM | POA: Diagnosis not present

## 2022-11-24 LAB — RAD ONC ARIA SESSION SUMMARY
Course Elapsed Days: 25
Plan Fractions Treated to Date: 5
Plan Prescribed Dose Per Fraction: 2 Gy
Plan Total Fractions Prescribed: 5
Plan Total Prescribed Dose: 10 Gy
Reference Point Dosage Given to Date: 10 Gy
Reference Point Session Dosage Given: 2 Gy
Session Number: 20

## 2022-11-27 ENCOUNTER — Other Ambulatory Visit: Payer: Self-pay | Admitting: Nurse Practitioner

## 2022-11-27 DIAGNOSIS — J4540 Moderate persistent asthma, uncomplicated: Secondary | ICD-10-CM

## 2022-11-27 NOTE — Telephone Encounter (Signed)
Please advise KH 

## 2022-11-27 NOTE — Radiation Completion Notes (Signed)
Patient Name: Heidi Tran, Heidi Tran MRN: [id number] Date of Birth: 1961-02-27 Referring Physician: Malachy Mood, M.D. Date of Service: 2022-11-27 Radiation Oncologist: Arnette Schaumann, M.D. 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 Staging on 2022-07-26: Ductal carcinoma in situ (DCIS) of left breast T=cTis (DCIS), N=cN0, M=cM0 Intent: Curative     ==========DELIVERED PLANS==========  First Treatment Date: 2022-10-30 - Last Treatment Date: 2022-11-24   Plan Name: Breast_L_BH Site: Breast, Left Technique: 3D Mode: Photon Dose Per Fraction: 2.67 Gy Prescribed Dose (Delivered / Prescribed): 40.05 Gy / 40.05 Gy Prescribed Fxs (Delivered / Prescribed): 15 / 15   Plan Name: Brst_L_Bst_BH Site: Breast, Left Technique: 3D Mode: Photon Dose Per Fraction: 2 Gy Prescribed Dose (Delivered / Prescribed): 10 Gy / 10 Gy Prescribed Fxs (Delivered / Prescribed): 5 / 5     ==========ON TREATMENT VISIT DATES========== 2022-10-31, 2022-11-10, 2022-11-14, 2022-11-21     ==========UPCOMING VISITS==========       ==========APPENDIX - ON TREATMENT VISIT NOTES==========   See weekly On Treatment Notes in Epic for details.

## 2022-11-28 ENCOUNTER — Other Ambulatory Visit (HOSPITAL_COMMUNITY): Payer: Self-pay

## 2022-11-29 ENCOUNTER — Other Ambulatory Visit: Payer: Self-pay

## 2022-11-29 MED ORDER — FLUTICASONE-SALMETEROL 250-50 MCG/ACT IN AEPB
1.0000 | INHALATION_SPRAY | Freq: Two times a day (BID) | RESPIRATORY_TRACT | 0 refills | Status: DC
Start: 2022-11-29 — End: 2022-12-27
  Filled 2022-11-29: qty 60, 30d supply, fill #0

## 2022-12-03 NOTE — Assessment & Plan Note (Signed)
pTisN0M0, stage 0 -Diagnosed in February 2024 -She underwent left breast lumpectomy, and a reexcision for positive margin.  I reviewed her surgical pathology findings with patient.  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 -She has completed adjuvant radiation -We again reviewed the benefit and potential side effect of adjuvant antiestrogen therapy.  Given the high-grade disease, I recommend her to consider low-dose tamoxifen 5 mg daily for 3 years. ---The potential side effects, which includes but not limited to, hot flash, skin and vaginal dryness, slightly increased risk of cardiovascular disease and cataract, small risk of thrombosis and endometrial cancer, were discussed with her in great details. Preventive strategies for thrombosis, such as being physically active, using compression stocks, avoid cigarette smoking, etc., were reviewed with her.  We discussed that risk of thrombosis and endometrial cancer is not much higher than general population in patients taking low-dose tamoxifen. She voiced good understanding, and agrees to proceed. Will start after she completes adjuvant breast radiation. -Again we reviewed the goal of therapy is preventative. -I recommend cancer surveillance with annual mammogram and breast MRI

## 2022-12-04 ENCOUNTER — Encounter: Payer: Self-pay | Admitting: Hematology

## 2022-12-04 ENCOUNTER — Other Ambulatory Visit: Payer: Self-pay

## 2022-12-04 ENCOUNTER — Other Ambulatory Visit (HOSPITAL_COMMUNITY): Payer: Self-pay

## 2022-12-04 ENCOUNTER — Inpatient Hospital Stay: Payer: Medicare Other | Attending: Hematology | Admitting: Hematology

## 2022-12-04 VITALS — BP 153/90 | HR 92 | Temp 97.9°F | Resp 18 | Ht 63.0 in | Wt 175.0 lb

## 2022-12-04 DIAGNOSIS — D0512 Intraductal carcinoma in situ of left breast: Secondary | ICD-10-CM

## 2022-12-04 DIAGNOSIS — Z79810 Long term (current) use of selective estrogen receptor modulators (SERMs): Secondary | ICD-10-CM | POA: Diagnosis not present

## 2022-12-04 DIAGNOSIS — Z90710 Acquired absence of both cervix and uterus: Secondary | ICD-10-CM | POA: Insufficient documentation

## 2022-12-04 MED ORDER — TAMOXIFEN CITRATE 10 MG PO TABS
5.0000 mg | ORAL_TABLET | Freq: Every day | ORAL | 1 refills | Status: DC
  Filled 2022-12-04 (×2): qty 45, 90d supply, fill #0

## 2022-12-04 NOTE — Progress Notes (Signed)
[facility]   Telephone:([phone] Fax:([phone]   Clinic Follow up Note   Patient Care Team: Ivonne Andrew, NP as PCP - General (Pulmonary Disease) Harriette Bouillon, MD as Consulting Physician (General Surgery) Malachy Mood, MD as Consulting Physician (Hematology) Antony Blackbird, MD as Consulting Physician (Radiation Oncology) Donnelly Angelica, RN as Oncology Nurse Navigator Pershing Proud, RN as Oncology Nurse Navigator  Date of Service:  12/04/2022  CHIEF COMPLAINT: f/u of left breast cancer    CURRENT THERAPY:  Tamoxifen 5 mg starting  12/2022  ASSESSMENT:  Heidi Tran is a 62 y.o. 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  I reviewed her surgical pathology findings with patient.  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 -She has completed adjuvant radiation -We again reviewed the benefit and potential side effect of adjuvant antiestrogen therapy.  Given the high-grade disease, I recommend her to consider low-dose tamoxifen 5 mg daily for 3 years. ---The potential side effects, which includes but not limited to, hot flash, skin and vaginal dryness, slightly increased risk of cardiovascular disease and cataract, small risk of thrombosis and endometrial cancer, were discussed with her in great details. Preventive strategies for thrombosis, such as being physically active, using compression stocks, avoid cigarette smoking, etc., were reviewed with her.  We discussed that risk of thrombosis and endometrial cancer is not much higher than general population in patients taking low-dose tamoxifen.  She had hysterectomy previously, no risk for endometrial cancer.  She voiced good understanding, and agrees to proceed. Will start after she  completes adjuvant breast radiation. -Again we reviewed the goal of therapy is preventative. -I recommend cancer surveillance with annual mammogram and breast MRI, she agrees     PLAN: -Discuss antiestrogen Tamoxifen and  its benefit and side effects -Pt decided to try Tamoxifen -I prescribe Tamoxifen 5 mg starting 12/2022 - I recommend annual mammogram/ breast MR and cancer surveillance - lab and f/u in 3 months  SUMMARY OF ONCOLOGIC HISTORY: Oncology History Overview Note   Cancer Staging  Ductal carcinoma in situ (DCIS) of left breast Staging form: Breast, AJCC 8th Edition - Clinical stage from 07/26/2022: Stage 0 (cTis (DCIS), cN0, cM0, ER+, PR+) - Unsigned Stage prefix: Initial diagnosis Nuclear grade: G3 - Pathologic stage from 08/18/2022: Stage 0 (pTis (DCIS), pN0, cM0, G3, ER+, PR+, HER2: Not Assessed) - Signed by Malachy Mood, MD on 12/03/2022 Histologic grading system: 3 grade system     Ductal carcinoma in situ (DCIS) of left breast  07/24/2022 Initial Diagnosis   Ductal carcinoma in situ (DCIS) of left breast   08/02/2022 Genetic Testing   Negative [facility] Common Hereditary Cancers +RNA Panel.  Report date is 08/03/2022.    The [facility] Common Hereditary Cancers + RNA Panel includes sequencing, deletion/duplication, and RNA analysis of the following 48 genes: APC, ATM, AXIN2, BAP1, BARD1, BMPR1A, BRCA1, BRCA2, BRIP1, CDH1, CDK4*, CDKN2A*, CHEK2, CTNNA1, DICER1, EPCAM* (del/dup only), FH, GREM1* (promoter dup analysis only), HOXB13*, KIT*, MBD4*, MEN1, MLH1, MSH2, MSH3, MSH6, MUTYH, NF1, NTHL1, PALB2, PDGFRA*, PMS2, POLD1, POLE, PTEN, RAD51C, RAD51D, SDHA (sequencing only), SDHB, SDHC, SDHD, SMAD4, SMARCA4, STK11, TP53, TSC1, TSC2, VHL.  *Genes without RNA analysis.    08/18/2022 Cancer Staging   Staging form: Breast, AJCC 8th Edition - Pathologic stage from  08/18/2022: Stage 0 (pTis (DCIS), pN0, cM0, G3, ER+, PR+, HER2: Not Assessed) - Signed by Malachy Mood, MD on 12/03/2022 Histologic  grading system: 3 grade system      INTERVAL HISTORY:  Heidi Tran is here for a follow up of left breast cancer. She was last seen by me on 07/26/2022 She presents to the clinic accompanied by family member. Pt state that she feels fatigue from radiation, but it overall went well. Pt state that she has some joint pain in her right hand, but she is going to f/u with her PCP.      All other systems were reviewed with the patient and are negative.  MEDICAL HISTORY:  Past Medical History:  Diagnosis Date   Allergic rhinitis, cause unspecified    Asthma    Hypercholesteremia 12/2019   Hypertension    Shoulder strain, left, initial encounter 04/2019   Unspecified essential hypertension    Vitamin D deficiency     SURGICAL HISTORY: Past Surgical History:  Procedure Laterality Date   ABDOMINAL HYSTERECTOMY     BREAST BIOPSY Left 07/18/2022   [place] LT BREAST BX W LOC DEV 1ST LESION IMG BX SPEC US GUIDE 07/18/2022 [facility]   BREAST BIOPSY Left 07/18/2022   [place] LT BREAST BX W LOC DEV EA ADD LESION IMG BX SPEC US GUIDE 07/18/2022 [facility]   BREAST BIOPSY  08/15/2022   MM LT RADIOACTIVE SEED EA ADD LESION LOC MAMMO GUIDE 08/15/2022 [facility]   BREAST BIOPSY  08/15/2022   MM LT RADIOACTIVE SEED LOC MAMMO GUIDE 08/15/2022 [facility]   BREAST LUMPECTOMY WITH RADIOACTIVE SEED LOCALIZATION Left 08/18/2022   Procedure: LEFT BREAST BRACKETED LUMPECTOMY WITH RADIOACTIVE SEED LOCALIZATION;  Surgeon: Harriette Bouillon, MD;  Location: [facility];  Service: General;  Laterality: Left;   RE-EXCISION OF BREAST LUMPECTOMY Left 09/21/2022   Procedure: RE-EXCISION OF LEFT BREAST LUMPECTOMY;  Surgeon: Harriette Bouillon, MD;  Location: [facility];  Service: General;  Laterality: Left;   SENTINEL NODE BIOPSY Left 09/21/2022   Procedure: LEFTSENTINEL LYMPH NODE MAPPING;  Surgeon: Harriette Bouillon, MD;  Location: [facility];  Service: General;   Laterality: Left;   TOTAL ABDOMINAL HYSTERECTOMY W/ BILATERAL SALPINGOOPHORECTOMY      I have reviewed the social history and family history with the patient and they are unchanged from previous note.  ALLERGIES:  is allergic to statins, shellfish allergy, and sulfonamide derivatives.  MEDICATIONS:  Current Outpatient Medications  Medication Sig Dispense Refill   tamoxifen (NOLVADEX) 10 MG tablet Take 0.5 tablets (5 mg total) by mouth daily. 45 tablet 1   albuterol (PROVENTIL) (2.5 MG/3ML) 0.083% nebulizer solution TAKE 3 MLS (2.5 MG TOTAL) BY NEBULIZATION EVERY 6 (SIX) HOURS AS NEEDED. 360 mL 3   albuterol (VENTOLIN HFA) 108 (90 Base) MCG/ACT inhaler Inhale 2 puffs into the lungs every 6 (six) hours as needed for wheezing or shortness of breath. 18 g 12   chlorhexidine (PERIDEX) 0.12 % solution SMARTSIG:By Mouth     fish oil-omega-3 fatty acids 1000 MG capsule Take 1 g by mouth daily.     fluticasone-salmeterol (ADVAIR DISKUS) 250-50 MCG/ACT AEPB Inhale 1 puff into the lungs in the morning and at bedtime. 60 each 0   hydrocortisone cream 1 % Apply 1 application topically 2 (two) times daily. 30 g 3   ibuprofen (ADVIL) 800 MG tablet Take 1 tablet (800 mg total) by mouth every 8 (eight) hours as needed. 30 tablet 0  montelukast (SINGULAIR) 10 MG tablet Take 1 tablet (10 mg total) by mouth at bedtime. 30 tablet 3   Multiple Vitamin (MULTIVITAMIN) capsule Take 1 capsule by mouth daily. 30 capsule 6   naproxen (NAPROSYN) 500 MG tablet Take 1 tablet (500 mg total) by mouth 2 (two) times daily with a meal. 180 tablet 1   oxyCODONE (OXY IR/ROXICODONE) 5 MG immediate release tablet Take 1 tablet (5 mg total) by mouth every 6 (six) hours as needed for severe pain. (Patient not taking: Reported on 11/06/2022) 15 tablet 0   potassium chloride SA (KLOR-CON M) 20 MEQ tablet Take 1 tablet (20 mEq total) by mouth daily. 90 tablet 0   triamcinolone (KENALOG) 0.1 % Apply 1 application topically 2 (two) times  daily. 30 g 3   triamterene-hydrochlorothiazide (MAXZIDE-25) 37.5-25 MG tablet Take 1 tablet by mouth daily. 90 tablet 1   Turmeric (QC TUMERIC COMPLEX PO) Take by mouth.     No current facility-administered medications for this visit.    PHYSICAL EXAMINATION: ECOG PERFORMANCE STATUS: 0 - Asymptomatic  Vitals:   12/04/22 1242  BP: (!) 153/90  Pulse: 92  Resp: 18  Temp: 97.9 F (36.6 C)  SpO2: 97%   Wt Readings from Last 3 Encounters:  12/04/22 175 lb (79.4 kg)  11/06/22 172 lb 12.8 oz (78.4 kg)  10/12/22 171 lb (77.6 kg)     GENERAL:alert, no distress and comfortable SKIN: skin color normal, no rashes or significant lesions EYES: normal, Conjunctiva are pink and non-injected, sclera clear  NEURO: alert & oriented x 3 with fluent speech  LABORATORY DATA:  I have reviewed the data as listed    Latest Ref Rng & Units 11/06/2022   11:12 AM 07/26/2022   12:08 PM 02/01/2022   12:03 PM  CBC  WBC 3.4 - 10.8 x10E3/uL 5.8  7.4  7.9   Hemoglobin 11.1 - 15.9 g/dL 47.8  29.5  62.1   Hematocrit 34.0 - 46.6 % 36.6  39.0  39.3   Platelets 150 - 450 x10E3/uL 323  353  311         Latest Ref Rng & Units 11/06/2022   11:12 AM 09/15/2022   12:00 PM 08/03/2022   10:36 AM  CMP  Glucose 70 - 99 mg/dL 308  89  657   BUN 8 - 27 mg/dL 17  5  8    Creatinine 0.57 - 1.00 mg/dL 8.46  9.62  9.52   Sodium 134 - 144 mmol/L 138  138  140   Potassium 3.5 - 5.2 mmol/L 3.7  3.7  3.7   Chloride 96 - 106 mmol/L 100  105  97   CO2 20 - 29 mmol/L 24  25  25    Calcium 8.7 - 10.3 mg/dL 9.8  9.5  84.1   Total Protein 6.0 - 8.5 g/dL 7.1   7.5   Total Bilirubin 0.0 - 1.2 mg/dL 0.2   0.3   Alkaline Phos 44 - 121 IU/L 100   118   AST 0 - 40 IU/L 14   12   ALT 0 - 32 IU/L 15   13       RADIOGRAPHIC STUDIES: I have personally reviewed the radiological images as listed and agreed with the findings in the report. No results found.    No orders of the defined types were placed in this encounter.  All  questions were answered. The patient knows to call the clinic with any problems, questions or concerns. No  barriers to learning was detected. The total time spent in the appointment was 30 minutes.     Malachy Mood, MD 12/04/2022   Carolin Coy, CMA, am acting as scribe for Malachy Mood, MD.   I have reviewed the above documentation for accuracy and completeness, and I agree with the above.

## 2022-12-08 ENCOUNTER — Other Ambulatory Visit: Payer: Self-pay | Admitting: Nurse Practitioner

## 2022-12-08 ENCOUNTER — Other Ambulatory Visit (HOSPITAL_COMMUNITY): Payer: Self-pay

## 2022-12-08 DIAGNOSIS — I10 Essential (primary) hypertension: Secondary | ICD-10-CM

## 2022-12-11 ENCOUNTER — Other Ambulatory Visit: Payer: Self-pay

## 2022-12-11 ENCOUNTER — Other Ambulatory Visit (HOSPITAL_COMMUNITY): Payer: Self-pay

## 2022-12-11 MED ORDER — MONTELUKAST SODIUM 10 MG PO TABS
10.0000 mg | ORAL_TABLET | Freq: Every day | ORAL | 3 refills | Status: DC
  Filled 2022-12-11: qty 30, 30d supply, fill #0

## 2022-12-11 MED ORDER — TRIAMTERENE-HCTZ 37.5-25 MG PO TABS
1.0000 | ORAL_TABLET | Freq: Every day | ORAL | 1 refills | Status: DC
Start: 2022-12-11 — End: 2023-06-05
  Filled 2022-12-11: qty 90, 90d supply, fill #0
  Filled 2023-02-28: qty 90, 90d supply, fill #1

## 2022-12-11 MED ORDER — POTASSIUM CHLORIDE CRYS ER 20 MEQ PO TBCR
20.0000 meq | EXTENDED_RELEASE_TABLET | Freq: Every day | ORAL | 0 refills | Status: DC
  Filled 2022-12-11: qty 90, 90d supply, fill #0

## 2022-12-21 ENCOUNTER — Ambulatory Visit: Payer: Self-pay | Admitting: Radiation Oncology

## 2022-12-22 NOTE — Progress Notes (Signed)
  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 During her final weekly treatment check on 11/21/22, the patient endorsed a burning sensation, fatigue, mild hyperpigmentation changes, and some mild swelling to the left axilla. Physical exam performed on that same date showed some hyperpigmentation changes to the left breast area. No skin breakdown was appreciated. She was advised to used Neosporin if she develops any skin breakdown.   Plan: The patient has completed radiation treatment. The patient will return to radiation oncology clinic for routine followup in one month. I advised them to call or return sooner if they have any questions or concerns related to their recovery or treatment.  -----------------------------------  Billie Lade, PhD, MD  This document serves as a record of services personally performed by Antony Blackbird, MD. It was created on his behalf by Neena Rhymes, a trained medical scribe. The creation of this record is based on the scribe's personal observations and the provider's statements to them. This document has been checked and approved by the attending provider.

## 2022-12-25 ENCOUNTER — Encounter: Payer: Self-pay | Admitting: Radiation Oncology

## 2022-12-25 ENCOUNTER — Ambulatory Visit
Admission: RE | Admit: 2022-12-25 | Discharge: 2022-12-25 | Disposition: A | Discharge status: Home / Self Care | Payer: Medicare Other | Source: Ambulatory Visit | Attending: Radiation Oncology | Admitting: Radiation Oncology

## 2022-12-25 ENCOUNTER — Other Ambulatory Visit: Payer: Self-pay

## 2022-12-25 VITALS — BP 157/92 | HR 86 | Temp 96.9°F | Resp 18 | Ht 63.0 in | Wt 174.1 lb

## 2022-12-25 DIAGNOSIS — Z923 Personal history of irradiation: Secondary | ICD-10-CM | POA: Insufficient documentation

## 2022-12-25 DIAGNOSIS — D0512 Intraductal carcinoma in situ of left breast: Secondary | ICD-10-CM | POA: Insufficient documentation

## 2022-12-25 DIAGNOSIS — Z170 Estrogen receptor positive status [ER+]: Secondary | ICD-10-CM | POA: Insufficient documentation

## 2022-12-25 NOTE — Progress Notes (Signed)
Radiation Oncology         [phone]) [phone] ________________________________  Name: Heidi Tran MRN: [id number]  Date: 12/25/2022  DOB: [birth year]  Follow-Up Visit Note  CC: Ivonne Andrew, NP  Malachy Mood, MD    ICD-10-CM   1. 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 The patient tolerated radiation treatment relatively well. During her final weekly treatment check on 11/21/22, the patient endorsed a burning sensation, fatigue, mild hyperpigmentation changes, and some mild swelling to the left axilla. Physical exam performed on that same date showed some hyperpigmentation changes to the left breast area. No skin breakdown was appreciated. She was advised to used Neosporin if she develops any skin breakdown.   Since completing radiation therapy, the patient followed up with Dr. Mosetta Putt on 12/04/22. Based on her high-grade disease, Dr. Mosetta Putt has recommended antiestrogen therapy consisting of low-dose tamoxifen at 5 mg daily x 3 years. The patient has opted to proceed with this recommendation and will start tamoxifen later this week.   On evaluation today the patient continues to have some discomfort within the breast area some itching and a burning sensation occasionally within the breast.  She denies any nipple discharge or bleeding.                              Allergies:  is allergic to statins,  shellfish allergy, and sulfonamide derivatives.  Meds: Current Outpatient Medications  Medication Sig Dispense Refill   albuterol (PROVENTIL) (2.5 MG/3ML) 0.083% nebulizer solution TAKE 3 MLS (2.5 MG TOTAL) BY NEBULIZATION EVERY 6 (SIX) HOURS AS NEEDED. 360 mL 3   albuterol (VENTOLIN HFA) 108 (90 Base) MCG/ACT inhaler Inhale 2 puffs into the lungs every 6 (six) hours as needed for wheezing or shortness of breath. 18 g 12   chlorhexidine (PERIDEX) 0.12 % solution SMARTSIG:By Mouth     fish oil-omega-3 fatty acids 1000 MG capsule Take 1 g by mouth daily.     fluticasone-salmeterol (ADVAIR DISKUS) 250-50 MCG/ACT AEPB Inhale 1 puff into the lungs in the morning and at bedtime. 60 each 0   hydrocortisone cream 1 % Apply 1 application topically 2 (two) times daily. 30 g 3   ibuprofen (ADVIL) 800 MG tablet Take 1 tablet (800 mg total) by mouth every 8 (eight) hours as needed. 30 tablet 0   montelukast (SINGULAIR) 10 MG tablet Take 1 tablet (10 mg) by mouth at bedtime. 30 tablet 3   Multiple Vitamin (MULTIVITAMIN) capsule Take 1 capsule by mouth daily. 30 capsule 6   naproxen (NAPROSYN) 500 MG tablet Take 1 tablet (500 mg total) by mouth 2 (two) times daily with a meal. 180 tablet 1   potassium chloride SA (KLOR-CON M) 20 MEQ tablet Take 1 tablet (20 mEq total) by mouth daily. 90  tablet 0   triamcinolone (KENALOG) 0.1 % Apply 1 application topically 2 (two) times daily. 30 g 3   triamterene-hydrochlorothiazide (MAXZIDE-25) 37.5-25 MG tablet Take 1 tablet by mouth daily. 90 tablet 1   Turmeric (QC TUMERIC COMPLEX PO) Take by mouth.     oxyCODONE (OXY IR/ROXICODONE) 5 MG immediate release tablet Take 1 tablet (5 mg total) by mouth every 6 (six) hours as needed for severe pain. (Patient not taking: Reported on 11/06/2022) 15 tablet 0   tamoxifen (NOLVADEX) 10 MG tablet Take 0.5 tablets (5 mg total) by mouth daily. (Patient not taking: Reported on 12/25/2022) 45 tablet 1   No current facility-administered  medications for this encounter.    Physical Findings: The patient is in no acute distress. Patient is alert and oriented.  height is 5\' 3"  (1.6 m) and weight is 174 lb 2 oz (79 kg). Her temporal temperature is 96.9 F (36.1 C) (abnormal). Her blood pressure is 157/92 (abnormal) and her pulse is 86. Her respiration is 18 and oxygen saturation is 100%. .  No significant changes. Lungs are clear to auscultation bilaterally. Heart has regular rate and rhythm. No palpable cervical, supraclavicular, or axillary adenopathy. Abdomen soft, non-tender, normal bowel sounds.  Right Breast: no palpable mass, nipple discharge or bleeding. Left Breast: Skin is healed well.  Could continued hyperpigmentation changes throughout much of the breast.  No nipple discharge or bleeding.  Some induration noted to the lumpectomy scar consistent with surgical effect.  Lab Findings: Lab Results  Component Value Date   WBC 5.8 11/06/2022   HGB 11.7 11/06/2022   HCT 36.6 11/06/2022   MCV 82 11/06/2022   PLT 323 11/06/2022    Radiographic Findings: No results found.  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  Patient has recovered well at this time with minimal side effects.  Plan: As needed follow-up in radiation oncology.  Patient will continue close follow-up and medical oncology and continue on tamoxifen.    ____________________________________  Billie Lade, PhD, MD  This document serves as a record of services personally performed by Antony Blackbird, MD. It was created on his behalf by Neena Rhymes, a trained medical scribe. The creation of this record is based on the scribe's personal observations and the provider's statements to them. This document has been checked and approved by the attending provider.

## 2022-12-25 NOTE — Progress Notes (Signed)
Heidi Tran is here today for follow up post radiation to the breast.   Breast Side:Left   They completed their radiation on: 11/24/22   Does the patient complain of any of the following: Post radiation skin issues: She reports some burning to the skin within the treated field.  She is using aloe vera gel and radiaplex gel.  She states the area to her axilla is starting to heal.  Skin is intact.  Skin remains darker in color. Breast Tenderness: She continues to have some tenderness and occasional shooting pains in her breast. Breast Swelling: She reports some tightness to her breast.  She is wearing a sports bra. Lymphadema: No Range of Motion limitations: No Fatigue post radiation: She continues to have fatigue.  Still able to do her normal activities, needs a rest period between. Appetite good/fair/poor: Good.  Additional comments if applicable:

## 2022-12-26 ENCOUNTER — Other Ambulatory Visit: Payer: Medicare Other | Admitting: Pharmacist

## 2022-12-26 NOTE — Patient Instructions (Signed)
Myda,   Start rosuvastatin 10 mg daily. Please let me know if you develop any muscle symptoms.   Thanks!  Catie Eppie Gibson, PharmD, BCACP, CPP Clinical Pharmacist [facility] [phone]

## 2022-12-26 NOTE — Progress Notes (Signed)
12/26/2022 Name: Heidi Tran MRN: [id number] DOB: [birth year]  Chief Complaint  Patient presents with   Medication Management   Hyperlipidemia    [name] is a 62 y.o. year old female who presented for a telephone visit.   They were referred to the pharmacist by their PCP for assistance in managing hyperlipidemia.    Subjective:  Care Team: Primary Care Provider: Ivonne Andrew, NP ; Next Scheduled Visit: 02/07/23  Medication Access/Adherence  Current Pharmacy:  [facility] - [facility] [address], [address] [place] [address] Phone: [phone] Fax: [phone]  [id number] - [place], [place] - [address] [place] [address] Phone: [phone] Fax: [phone]   Patient reports affordability concerns with their medications: No  Patient reports access/transportation concerns to their pharmacy: No  Patient reports adherence concerns with their medications:  No     Hypertension:  Current medications: triamterene/hydrochlorothiazide 37.5/25 mg daily  Hyperlipidemia/ASCVD Risk Reduction  Current lipid lowering medications: none Medications tried in the past: rosuvastatin 40 mg daily - muscle cramps/spasms, atorvastatin- dose unknown, but also reports muscle spasms/cramps; ezetimibe 10 mg, simvastatin 40 mg - muscle spasms/cramps; pitavastatin 2 mg per chart review, but patient does not remember; gemfibrozil 600 mg  Family History: father - had a heart attack (60s); mother's parents also had heart disease   Current meal patterns: has been cutting back on fatty foods; now using an air fryer; infrequent red meat, mostly chicken, [place], or fish    PREVENT Risk Score: 10 year risk of CVD: 9.9% - 10 year risk of ASCVD: 7.2% - 10 year risk of HF: 5.6%  Asthma:  Current medications: Advair 250/50 mg twice daily, albuterol HFA PRN, albuterol nebulizer PRN; montelukast 10 mg  daily  Reports this regimen is effective    Objective:  Lab Results  Component Value Date   HGBA1C 6.5 (H) 11/06/2022    Lab Results  Component Value Date   CREATININE 0.84 11/06/2022   BUN 17 11/06/2022   NA 138 11/06/2022   K 3.7 11/06/2022   CL 100 11/06/2022   CO2 24 11/06/2022    Lab Results  Component Value Date   CHOL 277 (H) 11/06/2022   HDL 49 11/06/2022   LDLCALC 189 (H) 11/06/2022   TRIG 207 (H) 11/06/2022   CHOLHDL 5.7 (H) 11/06/2022    Medications Reviewed Today     Reviewed by Alden Hipp, RPH-CPP (Pharmacist) on 12/26/22 at 1402  Med List Status: <None>   Medication Order Taking? Sig Documenting Provider Last Dose Status Informant  albuterol (PROVENTIL) (2.5 MG/3ML) 0.083% nebulizer solution [id number] Yes TAKE 3 MLS (2.5 MG TOTAL) BY NEBULIZATION EVERY 6 (SIX) HOURS AS NEEDED. Ivonne Andrew, NP Taking Active   albuterol (VENTOLIN HFA) 108 (90 Base) MCG/ACT inhaler [id number] Yes Inhale 2 puffs into the lungs every 6 (six) hours as needed for wheezing or shortness of breath. Ivonne Andrew, NP Taking Active   fluticasone-salmeterol (ADVAIR DISKUS) 250-50 MCG/ACT AEPB [id number] Yes Inhale 1 puff into the lungs in the morning and at bedtime. Ivonne Andrew, NP Taking Active   hydrocortisone cream 1 % [id number] Yes Apply 1 application topically 2 (two) times daily. Kallie Locks, FNP Taking Active            Med Note Kallie Locks   Tue Apr 29, 2019  3:17 PM) As needed.   montelukast (SINGULAIR) 10 MG tablet [id number] Yes Take 1 tablet (10  mg) by mouth at bedtime. Ivonne Andrew, NP Taking Active   Multiple Vitamin (MULTIVITAMIN) capsule [id number] Yes Take 1 capsule by mouth daily. Kallie Locks, FNP Taking Active   naproxen (NAPROSYN) 500 MG tablet [id number] No Take 1 tablet (500 mg total) by mouth 2 (two) times daily with a meal.  Patient not taking: Reported on 12/26/2022   Kallie Locks, FNP Not Taking Active   potassium  chloride SA (KLOR-CON M) 20 MEQ tablet [id number] Yes Take 1 tablet (20 mEq total) by mouth daily. Ivonne Andrew, NP Taking Active   tamoxifen (NOLVADEX) 10 MG tablet [id number]  Take 0.5 tablets (5 mg total) by mouth daily.  Patient not taking: Reported on 12/25/2022   Malachy Mood, MD  Active            Med Note Clearance Coots,  T   Tue Dec 26, 2022  2:01 PM) Starts 8/1  triamcinolone (KENALOG) 0.1 % [id number] Yes Apply 1 application topically 2 (two) times daily. Kallie Locks, FNP Taking Active   triamterene-hydrochlorothiazide (MAXZIDE-25) 37.5-25 MG tablet [id number] Yes Take 1 tablet by mouth daily. Ivonne Andrew, NP Taking Active   Turmeric (QC TUMERIC COMPLEX PO) [id number] Yes Take by mouth. [provider] Taking Active   Med List Note Maple Hudson, Rennis Chris, MD 01/[birth year]11 1946): Allergy vaccine 1:10 GO              Assessment/Plan:   Hypertension: - Currently controlled per last PCP office visit - Recommend to continue current regimen at this time   Hyperlipidemia/ASCVD Risk Reduction: - Currently uncontrolled.  - Reviewed long term complications of uncontrolled cholesterol - Reviewed dietary recommendations including focus on lean meats, minimizing fried foods - Recommend to retry rosuvastatin at moderate intensity dose of 10 mg daily. Will discuss with PCP. Patient verbalizes understanding and agreement.    Asthma - Currently controlled.  - Recommend to continue current regimen at this time    Follow Up Plan: phone call in 4 weeks  Catie TClearance Coots, PharmD, BCACP, CPP Clinical Pharmacist [facility] [phone]

## 2022-12-27 ENCOUNTER — Other Ambulatory Visit (HOSPITAL_COMMUNITY): Payer: Self-pay

## 2022-12-27 ENCOUNTER — Other Ambulatory Visit: Payer: Self-pay | Admitting: Nurse Practitioner

## 2022-12-27 ENCOUNTER — Other Ambulatory Visit: Payer: Self-pay

## 2022-12-27 DIAGNOSIS — J4540 Moderate persistent asthma, uncomplicated: Secondary | ICD-10-CM

## 2022-12-27 MED ORDER — FLUTICASONE-SALMETEROL 250-50 MCG/ACT IN AEPB
1.0000 | INHALATION_SPRAY | Freq: Two times a day (BID) | RESPIRATORY_TRACT | 0 refills | Status: DC
Start: 2022-12-27 — End: 2023-01-21
  Filled 2022-12-27: qty 60, 30d supply, fill #0

## 2022-12-28 ENCOUNTER — Other Ambulatory Visit: Payer: Self-pay

## 2022-12-28 ENCOUNTER — Other Ambulatory Visit (HOSPITAL_COMMUNITY): Payer: Self-pay

## 2022-12-28 MED ORDER — ROSUVASTATIN CALCIUM 10 MG PO TABS
10.0000 mg | ORAL_TABLET | Freq: Every day | ORAL | 3 refills | Status: DC
  Filled 2022-12-28 (×2): qty 90, 90d supply, fill #0

## 2023-01-21 ENCOUNTER — Other Ambulatory Visit: Payer: Self-pay | Admitting: Nurse Practitioner

## 2023-01-21 DIAGNOSIS — J4540 Moderate persistent asthma, uncomplicated: Secondary | ICD-10-CM

## 2023-01-22 ENCOUNTER — Other Ambulatory Visit (HOSPITAL_COMMUNITY): Payer: Self-pay

## 2023-01-22 MED ORDER — FLUTICASONE-SALMETEROL 250-50 MCG/ACT IN AEPB
1.0000 | INHALATION_SPRAY | Freq: Two times a day (BID) | RESPIRATORY_TRACT | 0 refills | Status: AC
Start: 2023-01-22 — End: ?
  Filled 2023-01-22: qty 60, 30d supply, fill #0

## 2023-01-23 ENCOUNTER — Other Ambulatory Visit: Payer: Self-pay

## 2023-01-30 ENCOUNTER — Other Ambulatory Visit: Payer: Medicare Other | Admitting: Pharmacist

## 2023-01-30 ENCOUNTER — Telehealth: Payer: Self-pay | Admitting: Pharmacist

## 2023-01-30 NOTE — Progress Notes (Signed)
Attempted to contact patient for scheduled appointment for medication management. Left HIPAA compliant message for patient to return my call at their convenience.   Catie T. Harper, PharmD, BCACP, CPP Clinical Pharmacist [facility] [phone]  

## 2023-02-02 ENCOUNTER — Encounter: Payer: Self-pay | Admitting: Pharmacist

## 2023-02-07 ENCOUNTER — Other Ambulatory Visit: Payer: Self-pay | Admitting: Nurse Practitioner

## 2023-02-07 ENCOUNTER — Ambulatory Visit (INDEPENDENT_AMBULATORY_CARE_PROVIDER_SITE_OTHER): Payer: Medicare Other | Admitting: Nurse Practitioner

## 2023-02-07 ENCOUNTER — Other Ambulatory Visit: Payer: Self-pay

## 2023-02-07 ENCOUNTER — Other Ambulatory Visit (HOSPITAL_COMMUNITY): Payer: Self-pay

## 2023-02-07 ENCOUNTER — Encounter: Payer: Self-pay | Admitting: Nurse Practitioner

## 2023-02-07 ENCOUNTER — Ambulatory Visit (HOSPITAL_COMMUNITY)
Admission: RE | Admit: 2023-02-07 | Discharge: 2023-02-07 | Disposition: A | Discharge status: Home / Self Care | Payer: Medicare Other | Source: Ambulatory Visit | Attending: Nurse Practitioner | Admitting: Nurse Practitioner

## 2023-02-07 ENCOUNTER — Encounter (HOSPITAL_COMMUNITY): Payer: Self-pay

## 2023-02-07 VITALS — BP 140/79 | HR 88 | Temp 98.1°F | Resp 14 | Ht 63.0 in | Wt 175.0 lb

## 2023-02-07 DIAGNOSIS — R7303 Prediabetes: Secondary | ICD-10-CM | POA: Diagnosis not present

## 2023-02-07 DIAGNOSIS — R110 Nausea: Secondary | ICD-10-CM

## 2023-02-07 DIAGNOSIS — M79641 Pain in right hand: Secondary | ICD-10-CM | POA: Insufficient documentation

## 2023-02-07 DIAGNOSIS — R936 Abnormal findings on diagnostic imaging of limbs: Secondary | ICD-10-CM

## 2023-02-07 LAB — POCT GLYCOSYLATED HEMOGLOBIN (HGB A1C): HbA1c, POC (prediabetic range): 6.5 % — AB (ref 5.7–6.4)

## 2023-02-07 MED ORDER — ONDANSETRON 4 MG PO TBDP
4.0000 mg | ORAL_TABLET | Freq: Three times a day (TID) | ORAL | 0 refills | Status: DC | PRN
Start: 2023-02-07 — End: 2024-04-08
  Filled 2023-02-07 (×2): qty 20, 7d supply, fill #0

## 2023-02-07 MED ORDER — PREDNISONE 20 MG PO TABS
20.0000 mg | ORAL_TABLET | Freq: Every day | ORAL | 0 refills | Status: AC
  Filled 2023-02-07 (×2): qty 5, 5d supply, fill #0

## 2023-02-07 MED ORDER — DICLOFENAC SODIUM 1 % EX GEL
2.0000 g | Freq: Four times a day (QID) | CUTANEOUS | 2 refills | Status: AC
  Filled 2023-02-07: qty 100, 13d supply, fill #0
  Filled 2023-02-07: qty 100, 30d supply, fill #0

## 2023-02-07 NOTE — Patient Instructions (Addendum)
1. Nausea  - ondansetron (ZOFRAN-ODT) 4 MG disintegrating tablet; Take 1 tablet (4 mg total) by mouth every 8 (eight) hours as needed.  Dispense: 20 tablet; Refill: 0   2. Right hand pain  - DG Hand Complete Right   Follow up:  Follow up in 6 months

## 2023-02-07 NOTE — Progress Notes (Unsigned)
@Patient  ID: Heidi Tran, female    DOB: [birth year], 62 y.o.   MRN: [id number]  Chief Complaint  Patient presents with   Follow-up    Referring provider: Ivonne Andrew, NP   HPI  Complaining of right hand pain started X3 months ago No known injury to same per pt   Heidi Tran 62 y.o. female  has a past medical history of Allergic rhinitis, cause unspecified, Asthma, Hypercholesteremia (12/2019), Hypertension, Shoulder strain, left, initial encounter (04/2019), Unspecified essential hypertension, and Vitamin D deficiency.   Hypertension:   Patient presents today for hypertension. She is compliant with taking Maxide. Denies f/c/s, n/v/d, hemoptysis, PND, leg swelling Denies chest pain or edema   Recently had lumpectomy and recently completed radiation treatments. Follows with oncology.  Currently on tamoxifen.  She states this is causing nausea.  We will order Zofran.   Has not followed up with GI for positive colon cancer screening. Wants to finish with breast treatment first.   Patient has finished radiation - now on tamoxifin - nausea   A1c is 6.5 today in office.  This is the same as last time.  We will continue to monitor.  Discussed diabetic diet in office today.  Denies f/c/s, hemoptysis, PND, leg swelling Denies chest pain or edema    Allergies  Allergen Reactions   Statins Other (See Comments)    Leg cramps   Shellfish Allergy Swelling    Throat, lips and tongue swelling   Sulfonamide Derivatives     REACTION: rash    Immunization History  Administered Date(s) Administered   Influenza Split 02/17/2011, 03/12/2012, 01/27/2013, 02/26/2014   Influenza Whole 02/26/2009, 02/26/2010   Influenza,inj,Quad PF,6+ Mos 07/17/2017, 02/18/2018, 01/31/2019, 03/18/2020, 03/24/2021, 05/04/2022   PFIZER(Purple Top)SARS-COV-2 Vaccination 08/19/2019, 09/16/2019, 04/26/2020   Pneumococcal Polysaccharide-23 05/29/2006    Past Medical History:  Diagnosis Date    Allergic rhinitis, cause unspecified    Asthma    Hypercholesteremia 12/2019   Hypertension    Shoulder strain, left, initial encounter 04/2019   Unspecified essential hypertension    Vitamin D deficiency     Tobacco History: Social History   Tobacco Use  Smoking Status Never  Smokeless Tobacco Never   Counseling given: Not Answered   Outpatient Encounter Medications as of 02/07/2023  Medication Sig   albuterol (PROVENTIL) (2.5 MG/3ML) 0.083% nebulizer solution TAKE 3 MLS (2.5 MG TOTAL) BY NEBULIZATION EVERY 6 (SIX) HOURS AS NEEDED.   albuterol (VENTOLIN HFA) 108 (90 Base) MCG/ACT inhaler Inhale 2 puffs into the lungs every 6 (six) hours as needed for wheezing or shortness of breath.   diclofenac Sodium (VOLTAREN) 1 % GEL Apply 2 g topically 4 (four) times daily.   fluticasone-salmeterol (ADVAIR DISKUS) 250-50 MCG/ACT AEPB Inhale 1 puff into the lungs in the morning and at bedtime.   hydrocortisone cream 1 % Apply 1 application topically 2 (two) times daily.   montelukast (SINGULAIR) 10 MG tablet Take 1 tablet (10 mg) by mouth at bedtime.   Multiple Vitamin (MULTIVITAMIN) capsule Take 1 capsule by mouth daily.   ondansetron (ZOFRAN-ODT) 4 MG disintegrating tablet Take 1 tablet (4 mg total) by mouth every 8 (eight) hours as needed.   potassium chloride SA (KLOR-CON M) 20 MEQ tablet Take 1 tablet (20 mEq total) by mouth daily.   predniSONE (DELTASONE) 20 MG tablet Take 1 tablet (20 mg total) by mouth daily with breakfast for 5 days.   rosuvastatin (CRESTOR) 10 MG tablet Take 1 tablet (10 mg total) by mouth  daily.   triamcinolone (KENALOG) 0.1 % Apply 1 application topically 2 (two) times daily.   triamterene-hydrochlorothiazide (MAXZIDE-25) 37.5-25 MG tablet Take 1 tablet by mouth daily.   Turmeric (QC TUMERIC COMPLEX PO) Take by mouth.   naproxen (NAPROSYN) 500 MG tablet Take 1 tablet (500 mg total) by mouth 2 (two) times daily with a meal. (Patient not taking: Reported on 12/26/2022)    tamoxifen (NOLVADEX) 10 MG tablet Take 0.5 tablets (5 mg total) by mouth daily. (Patient not taking: Reported on 12/25/2022)   No facility-administered encounter medications on file as of 02/07/2023.     Review of Systems  Review of Systems  Constitutional: Negative.   HENT: Negative.    Cardiovascular: Negative.   Gastrointestinal:  Positive for nausea.  Allergic/Immunologic: Negative.   Neurological: Negative.   Psychiatric/Behavioral: Negative.         Physical Exam  BP (!) 140/79 (BP Location: Right Arm, Patient Position: Sitting, Cuff Size: Normal)   Pulse 88   Temp 98.1 F (36.7 C)   Resp 14   Ht 5\' 3"  (1.6 m)   Wt 175 lb (79.4 kg)   SpO2 100%   BMI 31.00 kg/m   Wt Readings from Last 5 Encounters:  02/07/23 175 lb (79.4 kg)  12/25/22 174 lb 2 oz (79 kg)  12/04/22 175 lb (79.4 kg)  11/06/22 172 lb 12.8 oz (78.4 kg)  10/12/22 171 lb (77.6 kg)     Physical Exam Vitals and nursing note reviewed.  Constitutional:      General: She is not in acute distress.    Appearance: She is well-developed.  Cardiovascular:     Rate and Rhythm: Normal rate and regular rhythm.  Pulmonary:     Effort: Pulmonary effort is normal.     Breath sounds: Normal breath sounds.  Neurological:     Mental Status: She is alert and oriented to person, place, and time.      Lab Results:  CBC    Component Value Date/Time   WBC 5.8 11/06/2022 1112   WBC 7.4 07/26/2022 1208   WBC 7.7 09/30/2017 1040   RBC 4.49 11/06/2022 1112   RBC 4.91 07/26/2022 1208   HGB 11.7 11/06/2022 1112   HCT 36.6 11/06/2022 1112   PLT 323 11/06/2022 1112   MCV 82 11/06/2022 1112   MCH 26.1 (L) 11/06/2022 1112   MCH 26.9 07/26/2022 1208   MCHC 32.0 11/06/2022 1112   MCHC 33.8 07/26/2022 1208   RDW 13.9 11/06/2022 1112   LYMPHSABS 2.5 07/26/2022 1208   LYMPHSABS 2.1 01/21/2020 1222   MONOABS 0.6 07/26/2022 1208   EOSABS 0.2 07/26/2022 1208   EOSABS 0.1 01/21/2020 1222   BASOSABS 0.1  07/26/2022 1208   BASOSABS 0.1 01/21/2020 1222    BMET    Component Value Date/Time   NA 138 11/06/2022 1112   K 3.7 11/06/2022 1112   CL 100 11/06/2022 1112   CO2 24 11/06/2022 1112   GLUCOSE 129 (H) 11/06/2022 1112   GLUCOSE 89 09/15/2022 1200   BUN 17 11/06/2022 1112   CREATININE 0.84 11/06/2022 1112   CREATININE 0.81 07/26/2022 1208   CALCIUM 9.8 11/06/2022 1112   GFRNONAA >60 09/15/2022 1200   GFRNONAA >60 07/26/2022 1208   GFRAA 92 01/21/2020 1222      Assessment & Plan:   Nausea - ondansetron (ZOFRAN-ODT) 4 MG disintegrating tablet; Take 1 tablet (4 mg total) by mouth every 8 (eight) hours as needed.  Dispense: 20 tablet; Refill: 0  2. Right hand pain  - DG Hand Complete Right   Follow up:  Follow up in 6 months     Ivonne Andrew, NP 02/08/2023

## 2023-02-07 NOTE — Progress Notes (Unsigned)
Pt is here for 3 month f/u   Complaining of right hand pain started X3 months ago No known injury to same per pt

## 2023-02-08 ENCOUNTER — Other Ambulatory Visit (HOSPITAL_COMMUNITY): Payer: Self-pay

## 2023-02-08 ENCOUNTER — Encounter: Payer: Self-pay | Admitting: Nurse Practitioner

## 2023-02-08 DIAGNOSIS — R110 Nausea: Secondary | ICD-10-CM | POA: Insufficient documentation

## 2023-02-08 NOTE — Assessment & Plan Note (Signed)
-   ondansetron (ZOFRAN-ODT) 4 MG disintegrating tablet; Take 1 tablet (4 mg total) by mouth every 8 (eight) hours as needed.  Dispense: 20 tablet; Refill: 0   2. Right hand pain  - DG Hand Complete Right   Follow up:  Follow up in 6 months

## 2023-02-13 ENCOUNTER — Telehealth: Payer: Self-pay

## 2023-02-13 NOTE — Patient Outreach (Signed)
Care Guide Note  02/13/2023 Name: Heidi Tran MRN: [id number] DOB: 06/01/1960  Referred by: Ivonne Andrew, NP Reason for referral : patient outreach (Outreach to schedule with RPH.)   Heidi Tran is a 62 y.o. year old female who is a primary care patient of Ivonne Andrew, NP. Heidi Tran was referred to the pharmacist for assistance related to HTN.    An unsuccessful telephone outreach was attempted today to contact the patient who was referred to the pharmacy team for assistance with HTN. Additional attempts will be made to contact the patient.   Heidi Tran [facility] Assistant-Population Health [phone]

## 2023-02-14 ENCOUNTER — Telehealth: Payer: Self-pay

## 2023-02-14 NOTE — Progress Notes (Signed)
Care Coordination Note  02/14/2023 Name: [name] MRN: [id number] DOB: Jul 16, 1960  Heidi Tran is a 62 y.o. year old female who is a primary care patient of Ivonne Andrew, NP and is actively engaged with the Chronic Care Management team. I reached out to Venetta Petropoulos by phone today to assist with re-scheduling a follow up visit with the Pharmacist  Follow up plan: Unsuccessful telephone outreach attempt made. A HIPAA compliant phone message was left for the patient providing contact information and requesting a return call.  If patient returns call to provider office, please advise to call CCM Care Guide Penne Lash  at [phone]  Penne Lash, RMA Care Guide [facility]  [place], [place] [address] Direct Dial: [phone] [email or web address]

## 2023-02-16 NOTE — Progress Notes (Signed)
Care Coordination Note  02/16/2023 Name: Heidi Tran MRN: [id number] DOB: November 23, 1960  Heidi Tran is a 62 y.o. year old female who is a primary care patient of Ivonne Andrew, NP and is actively engaged with the Chronic Care Management team. I reached out to Cathi Beecham by phone today to assist with re-scheduling a follow up visit with the Pharmacist  Follow up plan: Telephone appointment with care management team member scheduled for:03/14/2023  Penne Lash, RMA Care Guide [facility]  [place], [place] [address] Direct Dial: [phone] [email or web address]

## 2023-02-28 ENCOUNTER — Other Ambulatory Visit: Payer: Self-pay | Admitting: Nurse Practitioner

## 2023-02-28 ENCOUNTER — Other Ambulatory Visit (HOSPITAL_COMMUNITY): Payer: Self-pay

## 2023-02-28 ENCOUNTER — Other Ambulatory Visit: Payer: Self-pay

## 2023-02-28 DIAGNOSIS — J4540 Moderate persistent asthma, uncomplicated: Secondary | ICD-10-CM

## 2023-02-28 MED ORDER — FLUTICASONE-SALMETEROL 250-50 MCG/ACT IN AEPB
1.0000 | INHALATION_SPRAY | Freq: Two times a day (BID) | RESPIRATORY_TRACT | 0 refills | Status: DC
  Filled 2023-02-28: qty 60, 30d supply, fill #0

## 2023-03-01 ENCOUNTER — Other Ambulatory Visit: Payer: Self-pay

## 2023-03-01 ENCOUNTER — Other Ambulatory Visit (HOSPITAL_COMMUNITY): Payer: Self-pay

## 2023-03-10 ENCOUNTER — Telehealth: Payer: Self-pay | Admitting: Nurse Practitioner

## 2023-03-13 ENCOUNTER — Inpatient Hospital Stay: Payer: Medicare Other | Admitting: Nurse Practitioner

## 2023-03-13 ENCOUNTER — Encounter: Payer: Self-pay | Admitting: Pharmacist

## 2023-03-14 ENCOUNTER — Other Ambulatory Visit: Payer: Medicare Other | Admitting: Pharmacist

## 2023-03-15 ENCOUNTER — Other Ambulatory Visit: Payer: Medicare Other

## 2023-03-27 ENCOUNTER — Inpatient Hospital Stay: Payer: Medicare Other | Attending: Hematology | Admitting: Nurse Practitioner

## 2023-03-27 VITALS — BP 149/83 | HR 89 | Temp 98.4°F | Resp 20 | Wt 176.0 lb

## 2023-03-27 DIAGNOSIS — D0512 Intraductal carcinoma in situ of left breast: Secondary | ICD-10-CM | POA: Insufficient documentation

## 2023-03-27 DIAGNOSIS — Z8042 Family history of malignant neoplasm of prostate: Secondary | ICD-10-CM | POA: Diagnosis not present

## 2023-03-27 DIAGNOSIS — Z90710 Acquired absence of both cervix and uterus: Secondary | ICD-10-CM | POA: Insufficient documentation

## 2023-03-27 DIAGNOSIS — Z923 Personal history of irradiation: Secondary | ICD-10-CM | POA: Diagnosis not present

## 2023-03-27 DIAGNOSIS — Z803 Family history of malignant neoplasm of breast: Secondary | ICD-10-CM | POA: Insufficient documentation

## 2023-03-27 DIAGNOSIS — Z1721 Progesterone receptor positive status: Secondary | ICD-10-CM | POA: Insufficient documentation

## 2023-03-27 DIAGNOSIS — Z170 Estrogen receptor positive status [ER+]: Secondary | ICD-10-CM | POA: Diagnosis not present

## 2023-03-27 DIAGNOSIS — C50912 Malignant neoplasm of unspecified site of left female breast: Secondary | ICD-10-CM | POA: Insufficient documentation

## 2023-03-27 NOTE — Progress Notes (Unsigned)
CLINIC:  Survivorship   Patient Care Team: Ivonne Andrew, NP as PCP - General (Pulmonary Disease) Harriette Bouillon, MD as Consulting Physician (General Surgery) Malachy Mood, MD as Consulting Physician (Hematology) Antony Blackbird, MD as Consulting Physician (Radiation Oncology) Donnelly Angelica, RN as Oncology Nurse Navigator Pershing Proud, RN as Oncology Nurse Navigator Alden Hipp, RPH-CPP (Pharmacist) Pollyann Samples, NP as Nurse Practitioner (Nurse Practitioner)  Date of Service: 03/27/2023  REASON FOR VISIT:  Routine follow-up post-treatment for a recent history of breast cancer.  BRIEF ONCOLOGIC HISTORY:  Oncology History Overview Note   Cancer Staging  Ductal carcinoma in situ (DCIS) of left breast Staging form: Breast, AJCC 8th Edition - Clinical stage from 07/26/2022: Stage 0 (cTis (DCIS), cN0, cM0, ER+, PR+) - Unsigned Stage prefix: Initial diagnosis Nuclear grade: G3 - Pathologic stage from 08/18/2022: Stage 0 (pTis (DCIS), pN0, cM0, G3, ER+, PR+, HER2: Not Assessed) - Signed by Malachy Mood, MD on 12/03/2022 Histologic grading system: 3 grade system     Ductal carcinoma in situ (DCIS) of left breast  07/24/2022 Initial Diagnosis   Ductal carcinoma in situ (DCIS) of left breast   08/02/2022 Genetic Testing   Negative [facility] Common Hereditary Cancers +RNA Panel.  Report date is 08/03/2022.    The [facility] Common Hereditary Cancers + RNA Panel includes sequencing, deletion/duplication, and RNA analysis of the following 48 genes: APC, ATM, AXIN2, BAP1, BARD1, BMPR1A, BRCA1, BRCA2, BRIP1, CDH1, CDK4*, CDKN2A*, CHEK2, CTNNA1, DICER1, EPCAM* (del/dup only), FH, GREM1* (promoter dup analysis only), HOXB13*, KIT*, MBD4*, MEN1, MLH1, MSH2, MSH3, MSH6, MUTYH, NF1, NTHL1, PALB2, PDGFRA*, PMS2, POLD1, POLE, PTEN, RAD51C, RAD51D, SDHA (sequencing only), SDHB, SDHC, SDHD, SMAD4, SMARCA4, STK11, TP53, TSC1, TSC2, VHL.  *Genes without RNA analysis.    08/18/2022 Cancer Staging    Staging form: Breast, AJCC 8th Edition - Pathologic stage from 08/18/2022: Stage 0 (pTis (DCIS), pN0, cM0, G3, ER+, PR+, HER2: Not Assessed) - Signed by Malachy Mood, MD on 12/03/2022 Histologic grading system: 3 grade system     INTERVAL HISTORY:  Ms. Grunder presents to the Survivorship Clinic today for our initial meeting to review her survivorship care plan detailing her treatment course for breast cancer, as well as monitoring long-term side effects of that treatment, education regarding health maintenance, screening, and overall wellness and health promotion.     Overall, Ms. Pattee is doing okay.  She has some breast tenderness at times but has otherwise recovered well from radiation.  She began tamoxifen in July, soon became extremely tired and down, thought she "might leave this earth."  Also had burning in her stomach, esophagus, and skin.  She had nausea with constipation, PCP prescribed Zofran which made the constipation worse.  She stopped it after 3-4 weeks and all side effects have resolved.  Still has intermittent hot flashes but were worse on tamoxifen.    REVIEW OF SYSTEMS:  Review of Systems - Oncology Breast: Denies any new nodularity, masses, tenderness, nipple changes, or nipple discharge.      ONCOLOGY TREATMENT TEAM:  1. Surgeon:  Dr. Luisa Hart at [facility] 2. Medical Oncologist: Dr. Mosetta Putt 3. Radiation Oncologist: Dr. Roselind Messier    PAST MEDICAL/SURGICAL HISTORY:  Past Medical History:  Diagnosis Date   Allergic rhinitis, cause unspecified    Asthma    Hypercholesteremia 12/2019   Hypertension    Shoulder strain, left, initial encounter 04/2019   Unspecified essential hypertension    Vitamin D deficiency    Past Surgical History:  Procedure Laterality Date   ABDOMINAL HYSTERECTOMY     BREAST BIOPSY Left 07/18/2022   [place] LT BREAST BX W LOC DEV 1ST LESION IMG BX SPEC US GUIDE 07/18/2022 [facility]   BREAST BIOPSY Left 07/18/2022   [place] LT BREAST BX  W LOC DEV EA ADD LESION IMG BX SPEC US GUIDE 07/18/2022 [facility]   BREAST BIOPSY  08/15/2022   MM LT RADIOACTIVE SEED EA ADD LESION LOC MAMMO GUIDE 08/15/2022 [facility]   BREAST BIOPSY  08/15/2022   MM LT RADIOACTIVE SEED LOC MAMMO GUIDE 08/15/2022 [facility]   BREAST LUMPECTOMY WITH RADIOACTIVE SEED LOCALIZATION Left 08/18/2022   Procedure: LEFT BREAST BRACKETED LUMPECTOMY WITH RADIOACTIVE SEED LOCALIZATION;  Surgeon: Harriette Bouillon, MD;  Location: [facility];  Service: General;  Laterality: Left;   RE-EXCISION OF BREAST LUMPECTOMY Left 09/21/2022   Procedure: RE-EXCISION OF LEFT BREAST LUMPECTOMY;  Surgeon: Harriette Bouillon, MD;  Location: [facility];  Service: General;  Laterality: Left;   SENTINEL NODE BIOPSY Left 09/21/2022   Procedure: LEFTSENTINEL LYMPH NODE MAPPING;  Surgeon: Harriette Bouillon, MD;  Location: [facility];  Service: General;  Laterality: Left;   TOTAL ABDOMINAL HYSTERECTOMY W/ BILATERAL SALPINGOOPHORECTOMY       ALLERGIES:  Allergies  Allergen Reactions   Statins Other (See Comments)    Leg cramps   Crestor [Rosuvastatin] Other (See Comments)    Joint pain   Shellfish Allergy Swelling    Throat, lips and tongue swelling   Sulfonamide Derivatives     REACTION: rash   Tamoxifen Nausea Only    Burning of skin, esophagus and stomach     CURRENT MEDICATIONS:  Outpatient Encounter Medications as of 03/27/2023  Medication Sig Note   albuterol (PROVENTIL) (2.5 MG/3ML) 0.083% nebulizer solution TAKE 3 MLS (2.5 MG TOTAL) BY NEBULIZATION EVERY 6 (SIX) HOURS AS NEEDED.    albuterol (VENTOLIN HFA) 108 (90 Base) MCG/ACT inhaler Inhale 2 puffs into the lungs every 6 (six) hours as needed for wheezing or shortness of breath.    diclofenac Sodium (VOLTAREN) 1 % GEL Apply 2 g topically 4 (four) times daily.    fluticasone-salmeterol (ADVAIR DISKUS) 250-50 MCG/ACT AEPB Inhale 1 puff into the lungs in the morning  and at bedtime.    hydrocortisone cream 1 % Apply 1 application topically 2 (two) times daily. 04/29/2019: As needed.    montelukast (SINGULAIR) 10 MG tablet Take 1 tablet (10 mg) by mouth at bedtime.    Multiple Vitamin (MULTIVITAMIN) capsule Take 1 capsule by mouth daily.    naproxen (NAPROSYN) 500 MG tablet Take 1 tablet (500 mg total) by mouth 2 (two) times daily with a meal. (Patient not taking: Reported on 12/26/2022)    ondansetron (ZOFRAN-ODT) 4 MG disintegrating tablet Take 1 tablet (4 mg total) by mouth every 8 (eight) hours as needed.    potassium chloride SA (KLOR-CON M) 20 MEQ tablet Take 1 tablet (20 mEq total) by mouth daily.    rosuvastatin (CRESTOR) 10 MG tablet Take 1 tablet (10 mg total) by mouth daily. (Patient not taking: Reported on 03/27/2023)    tamoxifen (NOLVADEX) 10 MG tablet Take 0.5 tablets (5 mg total) by mouth daily. (Patient not taking: Reported on 12/25/2022) 03/27/2023: Stopped due to side effects   triamcinolone (KENALOG) 0.1 % Apply 1 application topically 2 (two) times daily.    triamterene-hydrochlorothiazide (MAXZIDE-25) 37.5-25 MG tablet Take 1 tablet by mouth daily.    Turmeric (QC TUMERIC COMPLEX PO) Take by  mouth.    No facility-administered encounter medications on file as of 03/27/2023.     ONCOLOGIC FAMILY HISTORY:  Family History  Problem Relation Age of Onset   Breast cancer Mother 76       d. 27   Prostate cancer Father        dx 53s   Heart disease Father    Diabetes Father    Bone cancer Paternal Aunt 100       or other primary?   Cancer Paternal Aunt        appendix; early stage; dx after 72   Cancer Paternal Aunt        unknown type; dx 30s; ? 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  Ms. Larrick denies any current or history of tobacco, alcohol, or illicit drug use.     PHYSICAL EXAMINATION:  Vital Signs:    Vitals:   03/27/23 1242  BP: (!) 149/83  Pulse: 89  Resp: 20  Temp: 98.4 F (36.9 C)  SpO2: 98%   Filed Weights   03/27/23 1242  Weight: 176 lb (79.8 kg)   General: Well-nourished, well-appearing female in no acute distress.   HEENT:   Sclerae anicteric Lymph: No cervical, supraclavicular, or infraclavicular lymphadenopathy noted on palpation.  Respiratory:  Breathing non-labored.  Neuro: No focal deficits. Steady gait.  Psych: Mood and affect normal and appropriate for situation.  Extremities: No edema. MSK: No focal spinal tenderness to palpation.  Full range of motion in bilateral upper extremities Skin: Warm and dry. Breasts: No nipple discharge or inversion.  S/p left lumpectomy and radiation, incisions completely healed, mild hyperpigmentation, and localized firmness at the breast incision.  No palpable mass or nodularity in either breast or axilla that I could appreciate  LABORATORY DATA:  None for this visit.  DIAGNOSTIC IMAGING:  None for this visit.      ASSESSMENT AND PLAN:  Ms.. Rubert is a pleasant 62 y.o. female with Stage 0 left breast high-grade DCIS, ER+/PR+, diagnosed in (date), treated with lumpectomy, adjuvant radiation therapy, and tried anti-estrogen therapy with Tamoxifen in July 2024.  She presents to the Survivorship Clinic for our initial meeting and routine follow-up post-completion of treatment for breast cancer.    1. Stage 0 left breast cancer:  Ms. Calver is continuing to recover from definitive treatment for breast cancer. Exam shows left breast tenderness at the healed incision/scar which I feel is likely scar tissue vs seroma. We discussed imaging to confirm seroma for drainage, but she does not want drainage procedure. Will monitor clinically. She is due mammogram in 06/2023.   She tried antiestrogen with low-dose tamoxifen but did not tolerate well, due to fatigue, hot flashes, and burning sensation on the skin/stomach/esophagus.  She stopped  after 1 month and all side effects have resolved, except hot flashes which have improved.  We had a lengthy discussion about antiestrogen and the potential benefit, and the increased risk of recurrence if she does not take it.  I offered to rechallenge tamoxifen with supportive care medications for side effects versus alternative AI such as anastrozole/letrozole/Aromasin.  She declined those options and prefers to proceed with surveillance alone and practice healthy lifestyle and other natural risk reduction strategies.   That she is not taking antiestrogen I recommend to add additional screening breast MRI annually, staggered 6 months apart from mammogram for high risk screening.  She agrees.  Anticipate first MRI in 12/2023.   She will follow-up with her medical oncologist, Dr. Mosetta Putt in 6 months. Today, a comprehensive survivorship care plan and treatment summary was reviewed with the patient today detailing her breast cancer diagnosis, treatment course, potential late/long-term effects of treatment, appropriate follow-up care with recommendations for the future, and patient education resources.  A copy of this summary, along with a letter will be sent to the patient's primary care provider via mail/fax/In Basket message after today's visit.    2. Bone health:  Given Ms. Lorenz's age/history of breast cancer and her postmenopausal status, she is at risk for bone demineralization.  DEXA as per PCP.  In the meantime, she was encouraged to increase her consumption of foods rich in calcium, as well as increase her weight-bearing activities.  She was given education on specific activities to promote bone health.  3. Cancer screening:  Due to Ms. Hammerschmidt's history and her age, she should receive screening for skin and colon cancers. She is s/p total hysterectomy.The information and recommendations are listed on the patient's comprehensive care plan/treatment summary and were reviewed in detail with the patient.     4. Health maintenance and wellness promotion: Ms. Grave was encouraged to consume 5-7 servings of fruits and vegetables per day.  She was also encouraged to engage in moderate to vigorous exercise for 30 minutes per day most days of the week.  She was instructed to limit her alcohol consumption and continue to abstain from tobacco use.   5. Support services/counseling: It is not uncommon for this period of the patient's cancer care trajectory to be one of many emotions and stressors.  We discussed an opportunity for her to participate in the next session of [facility] ("Finding Your New Normal") support group series designed for patients after they have completed treatment.   Ms. Torris was encouraged to take advantage of our many other support services programs, support groups, and/or counseling in coping with her new life as a cancer survivor after completing anti-cancer treatment.  She was offered support today through active listening and expressive supportive counseling.  She was given information regarding our available services and encouraged to contact me with any questions or for help enrolling in any of our support group/programs.    Dispo:   -Pt d/c'd Tamoxifen due to SE's, declined alternative anti-estrogen -Proceed with surveillance alone -Mammogram due in 06/2023 (ordered), screening breast MRI 12/2023 -Return to cancer center in 6 months -Follow up with surgery as indicated -She is welcome to return back to the Survivorship Clinic at any time; no additional follow-up needed at this time.  -Consider referral back to survivorship as a long-term survivor for continued surveillance  Orders Placed This Encounter  Procedures   MM DIAG BREAST TOMO BILATERAL    Standing Status:   Future    Standing Expiration Date:   03/27/2024    Order Specific Question:   Reason for Exam (SYMPTOM  OR DIAGNOSIS REQUIRED)    Answer:   left breast cancer 06/2022 s/p lump/radiation    Order Specific Question:    Preferred imaging location?    Answer:   [facility]     A total of (40) minutes of face-to-face time was spent with this patient with greater than 50% of that time in counseling and care-coordination.   Santiago Glad, NP Survivorship Program [facility] [phone]   Note: PRIMARY CARE PROVIDER Ivonne Andrew, [place] [phone]

## 2023-03-28 ENCOUNTER — Telehealth: Payer: Self-pay | Admitting: Nurse Practitioner

## 2023-03-28 ENCOUNTER — Encounter: Payer: Self-pay | Admitting: Nurse Practitioner

## 2023-04-09 NOTE — Progress Notes (Unsigned)
04/10/2023 Name: Heidi Tran MRN: [id number] DOB: [birth year]  Chief Complaint  Patient presents with   Hyperlipidemia   Hypertension    Heidi Tran is a 62 y.o. year old female who presented for a telephone visit.   They were referred to the pharmacist by their PCP for assistance in managing hyperlipidemia.    Subjective:  Care Team: Primary Care Provider: Ivonne Andrew, NP ; Next Scheduled Visit: 05/09/2023  Medication Access/Adherence  Current Pharmacy:  [facility] [address], [address] [place] [address] Phone: [phone] Fax: [phone]  [id number] - [place], [place] - [address] [address] Phone: [phone] Fax: [phone]   Hypertension:  Current medications: triamterene/hydrochlorothiazide 37.5/25 mg daily   Patient has a validated, automated, upper arm home BP cuff Current blood pressure readings readings: 124/74 mmHg   Patient denies hypotensive s/sx including dizziness, lightheadedness.  Patient denies hypertensive symptoms including headache, chest pain, shortness of breath  Current physical activity: did not discuss at today's visit   Hyperlipidemia/ASCVD Risk Reduction  Current lipid lowering medications: None. Patient recently retrialed rosuvastatin 10 mg and experienced muscle cramps. She stopped medication and feels better now.   Medications tried in the past: rosuvastatin 40 mg daily - muscle cramps/spasms, atorvastatin- dose unknown, but also reports muscle spasms/cramps; ezetimibe 10 mg, simvastatin 40 mg - muscle spasms/cramps; pitavastatin 2 mg per chart review, but patient does not remember; gemfibrozil 600 mg   Family History: father - had a heart attack (49s); mother's parents also had heart disease   Current meal patterns: has been cutting back on fatty foods; now using an air fryer; infrequent red meat, mostly chicken,  [place], or fish   The 10-year ASCVD risk score (Arnett DK, et al., 2019) is: 17.1%   Values used to calculate the score:     Age: 42 years     Sex: Female     Is Non-Hispanic African American: Yes     Diabetic: No     Tobacco smoker: No     Systolic Blood Pressure: 149 mmHg     Is BP treated: Yes     HDL Cholesterol: 49 mg/dL     Total Cholesterol: 277 mg/dL   PREVENT Risk Score: 10 year risk of CVD: 9.9% - 10 year risk of ASCVD: 7.2% - 10 year risk of HF: 5.6%   Objective:  Lab Results  Component Value Date   HGBA1C 6.5 (A) 02/07/2023    Lab Results  Component Value Date   CREATININE 0.84 11/06/2022   BUN 17 11/06/2022   NA 138 11/06/2022   K 3.7 11/06/2022   CL 100 11/06/2022   CO2 24 11/06/2022    Lab Results  Component Value Date   CHOL 277 (H) 11/06/2022   HDL 49 11/06/2022   LDLCALC 189 (H) 11/06/2022   TRIG 207 (H) 11/06/2022   CHOLHDL 5.7 (H) 11/06/2022    Medications Reviewed Today     Reviewed by Roslyn Smiling, [facility] (Pharmacist) on 04/10/23 at 1425  Med List Status: <None>   Medication Order Taking? Sig Documenting Provider Last Dose Status Informant  albuterol (PROVENTIL) (2.5 MG/3ML) 0.083% nebulizer solution [id number] No TAKE 3 MLS (2.5 MG TOTAL) BY NEBULIZATION EVERY 6 (SIX) HOURS AS NEEDED.  Patient not taking: Reported on 04/10/2023   Ivonne Andrew, NP Not Taking Active   albuterol (VENTOLIN HFA) 108 (90 Base) MCG/ACT inhaler [id number] Yes Inhale 2 puffs into  the lungs every 6 (six) hours as needed for wheezing or shortness of breath. Ivonne Andrew, NP Taking Active   diclofenac Sodium (VOLTAREN) 1 % GEL [id number] Yes Apply 2 g topically 4 (four) times daily. Ivonne Andrew, NP Taking Active   fluticasone-salmeterol (ADVAIR DISKUS) 250-50 MCG/ACT AEPB [id number] Yes Inhale 1 puff into the lungs in the morning and at bedtime. Ivonne Andrew, NP Taking Active   hydrocortisone cream 1 % [id number] Yes Apply 1 application topically 2 (two)  times daily. Kallie Locks, FNP Taking Active            Med Note Kallie Locks   Tue Apr 29, 2019  3:17 PM) As needed.   montelukast (SINGULAIR) 10 MG tablet [id number] Yes Take 1 tablet (10 mg) by mouth at bedtime. Ivonne Andrew, NP Taking Active            Med Note Cornelius Moras, Dalma Panchal   Tue Apr 10, 2023  2:23 PM) As needed  Multiple Vitamin (MULTIVITAMIN) capsule [id number] Yes Take 1 capsule by mouth daily. Kallie Locks, FNP Taking Active   naproxen (NAPROSYN) 500 MG tablet [id number] Yes Take 1 tablet (500 mg total) by mouth 2 (two) times daily with a meal. Kallie Locks, FNP Taking Active   ondansetron (ZOFRAN-ODT) 4 MG disintegrating tablet [id number] No Take 1 tablet (4 mg total) by mouth every 8 (eight) hours as needed.  Patient not taking: Reported on 04/10/2023   Ivonne Andrew, NP Not Taking Active   potassium chloride SA (KLOR-CON M) 20 MEQ tablet [id number] Yes Take 1 tablet (20 mEq total) by mouth daily. Ivonne Andrew, NP Taking Active   triamcinolone (KENALOG) 0.1 % [id number] Yes Apply 1 application topically 2 (two) times daily. Kallie Locks, FNP Taking Active            Med Note Cornelius Moras, Skyelynn Rambeau   Tue Apr 10, 2023  2:24 PM) As needed  triamterene-hydrochlorothiazide (MAXZIDE-25) 37.5-25 MG tablet [id number] Yes Take 1 tablet by mouth daily. Ivonne Andrew, NP Taking Active   Turmeric (QC TUMERIC COMPLEX PO) [id number] Yes Take by mouth. [provider] Taking Active            Med Note Cornelius Moras, Geniva Lohnes   Tue Apr 10, 2023  2:24 PM) Takes when arthritis is acting up (as needed)  Med List Note Maple Hudson, Rennis Chris, MD 01/01/11 1946): Allergy vaccine 1:10 GO            Assessment/Plan:   Hypertension: - Currently controlled based on reported home readings  - Reviewed long term cardiovascular and renal outcomes of uncontrolled blood pressure - Reviewed appropriate blood pressure monitoring technique and reviewed goal blood pressure. Recommended to  check home blood pressure and heart rate periodically  - Recommend to continue triamterene/hydrochlorothiazide 37.5/25 mg daily   Hyperlipidemia/ASCVD Risk Reduction: - Currently uncontrolled based on most recent lipid panel taken 5 months ago - LDL 189 - Recommend a PCSK9 inhibitors (Alirocumab (Praluent) or Evolocumab (Repatha)). Discussed these injectables (including adverse effects, administration technique, and patient's comfort level) in detail. Patient amendable to trying therapy.  - Will work on filling out prior authorization for patient. Approved for [facility]   Follow Up Plan: 05/17/2023 with pharmacist via telephone  Roslyn Smiling, PharmD PGY1 Pharmacy Resident 04/10/2023 3:02 PM   I have reviewed the pharmacist's encounter and agree with their documentation.   Catie Eppie Gibson, PharmD, BCACP, CPP [facility] [phone]

## 2023-04-10 ENCOUNTER — Other Ambulatory Visit: Payer: Medicare Other | Admitting: Pharmacist

## 2023-04-10 DIAGNOSIS — E785 Hyperlipidemia, unspecified: Secondary | ICD-10-CM

## 2023-04-12 ENCOUNTER — Other Ambulatory Visit: Payer: Self-pay

## 2023-04-12 ENCOUNTER — Other Ambulatory Visit (HOSPITAL_COMMUNITY): Payer: Self-pay

## 2023-04-12 MED ORDER — REPATHA SURECLICK 140 MG/ML ~~LOC~~ SOAJ
140.0000 mg | SUBCUTANEOUS | 1 refills | Status: DC
Start: 2023-04-12 — End: 2024-02-11
  Filled 2023-04-12 (×2): qty 6, 84d supply, fill #0
  Filled 2023-07-31: qty 6, 84d supply, fill #1

## 2023-04-12 NOTE — Patient Instructions (Addendum)
Starkeisha,   It was great talking to you!  Start Repatha 140 mg every 2 weeks - [email or web address]   Please let me know if you have any questions or concerns!  Catie Eppie Gibson, PharmD, BCACP, CPP Clinical Pharmacist [facility] [phone]

## 2023-04-30 ENCOUNTER — Other Ambulatory Visit: Payer: Self-pay | Admitting: Nurse Practitioner

## 2023-04-30 DIAGNOSIS — J4540 Moderate persistent asthma, uncomplicated: Secondary | ICD-10-CM

## 2023-04-30 MED ORDER — FLUTICASONE-SALMETEROL 250-50 MCG/ACT IN AEPB
1.0000 | INHALATION_SPRAY | Freq: Two times a day (BID) | RESPIRATORY_TRACT | 0 refills | Status: DC
  Filled 2023-04-30: qty 60, 30d supply, fill #0

## 2023-04-30 MED ORDER — POTASSIUM CHLORIDE CRYS ER 20 MEQ PO TBCR
20.0000 meq | EXTENDED_RELEASE_TABLET | Freq: Every day | ORAL | 0 refills | Status: DC
  Filled 2023-04-30: qty 90, 90d supply, fill #0

## 2023-05-01 ENCOUNTER — Other Ambulatory Visit (HOSPITAL_COMMUNITY): Payer: Self-pay

## 2023-05-01 ENCOUNTER — Other Ambulatory Visit: Payer: Self-pay

## 2023-05-09 ENCOUNTER — Ambulatory Visit: Payer: Medicare Other | Admitting: Nurse Practitioner

## 2023-05-11 ENCOUNTER — Encounter: Payer: Self-pay | Admitting: Nurse Practitioner

## 2023-05-11 ENCOUNTER — Ambulatory Visit (INDEPENDENT_AMBULATORY_CARE_PROVIDER_SITE_OTHER): Payer: Medicare Other | Admitting: Nurse Practitioner

## 2023-05-11 VITALS — BP 144/88 | HR 87 | Temp 97.2°F | Wt 178.0 lb

## 2023-05-11 DIAGNOSIS — Z23 Encounter for immunization: Secondary | ICD-10-CM

## 2023-05-11 DIAGNOSIS — R7303 Prediabetes: Secondary | ICD-10-CM | POA: Diagnosis not present

## 2023-05-11 DIAGNOSIS — I10 Essential (primary) hypertension: Secondary | ICD-10-CM | POA: Diagnosis not present

## 2023-05-11 LAB — POCT GLYCOSYLATED HEMOGLOBIN (HGB A1C): Hemoglobin A1C: 6.5 % — AB (ref 4.0–5.6)

## 2023-05-11 NOTE — Progress Notes (Signed)
Subjective   Patient ID: Heidi Tran, female    DOB: [birth year], 62 y.o.   MRN: [id number]  Chief Complaint  Patient presents with   Hyperlipidemia   Hypertension    Referring provider: Ivonne Andrew, NP  Heidi Tran is a 62 y.o. female with Past Medical History: No date: Allergic rhinitis, cause unspecified No date: Asthma 12/2019: Hypercholesteremia No date: Hypertension 04/2019: Shoulder strain, left, initial encounter No date: Unspecified essential hypertension No date: Vitamin D deficiency   HPI  Hypertension:   Patient presents today for hypertension. She is compliant with taking Maxide. Denies f/c/s, n/v/d, hemoptysis, PND, leg swelling Denies chest pain or edema   Recently had lumpectomy and recently completed radiation treatments. Follows with oncology.  Stopped taking tamoxifen. Has notified oncology.  She states it was causing nausea.    Has not followed up with GI for positive colon cancer screening. Will get this scheduled in January   Patient has finished radiation - now on tamoxifin - nausea     A1c is 6.5 today in office.  This is the same as last time.  We will continue to monitor.  Discussed diabetic diet in office today.   Denies f/c/s, hemoptysis, PND, leg swelling Denies chest pain or edema    Allergies  Allergen Reactions   Statins Other (See Comments)    Leg cramps   Crestor [Rosuvastatin] Other (See Comments)    Joint pain   Shellfish Allergy Swelling    Throat, lips and tongue swelling   Sulfonamide Derivatives     REACTION: rash   Tamoxifen Nausea Only    Burning of skin, esophagus and stomach    Immunization History  Administered Date(s) Administered   Influenza Split 02/17/2011, 03/12/2012, 01/27/2013, 02/26/2014   Influenza Whole 02/26/2009, 02/26/2010   Influenza, Seasonal, Injecte, Preservative Fre 05/11/2023   Influenza,inj,Quad PF,6+ Mos 07/17/2017, 02/18/2018, 01/31/2019, 03/18/2020, 03/24/2021, 05/04/2022    PFIZER(Purple Top)SARS-COV-2 Vaccination 08/19/2019, 09/16/2019, 04/26/2020   Pneumococcal Polysaccharide-23 05/29/2006    Tobacco History: Social History   Tobacco Use  Smoking Status Never  Smokeless Tobacco Never   Counseling given: Not Answered   Outpatient Encounter Medications as of 05/11/2023  Medication Sig   albuterol (PROVENTIL) (2.5 MG/3ML) 0.083% nebulizer solution TAKE 3 MLS (2.5 MG TOTAL) BY NEBULIZATION EVERY 6 (SIX) HOURS AS NEEDED.   albuterol (VENTOLIN HFA) 108 (90 Base) MCG/ACT inhaler Inhale 2 puffs into the lungs every 6 (six) hours as needed for wheezing or shortness of breath.   diclofenac Sodium (VOLTAREN) 1 % GEL Apply 2 g topically 4 (four) times daily.   Evolocumab (REPATHA SURECLICK) 140 MG/ML SOAJ Inject 140 mg into the skin every 14 (fourteen) days.   fluticasone-salmeterol (ADVAIR DISKUS) 250-50 MCG/ACT AEPB Inhale 1 puff into the lungs in the morning and at bedtime.   hydrocortisone cream 1 % Apply 1 application topically 2 (two) times daily.   montelukast (SINGULAIR) 10 MG tablet Take 1 tablet (10 mg) by mouth at bedtime.   Multiple Vitamin (MULTIVITAMIN) capsule Take 1 capsule by mouth daily.   naproxen (NAPROSYN) 500 MG tablet Take 1 tablet (500 mg total) by mouth 2 (two) times daily with a meal.   potassium chloride SA (KLOR-CON M) 20 MEQ tablet Take 1 tablet (20 mEq total) by mouth daily.   triamcinolone (KENALOG) 0.1 % Apply 1 application topically 2 (two) times daily.   triamterene-hydrochlorothiazide (MAXZIDE-25) 37.5-25 MG tablet Take 1 tablet by mouth daily.   Turmeric (QC TUMERIC COMPLEX PO) Take by  mouth.   ondansetron (ZOFRAN-ODT) 4 MG disintegrating tablet Take 1 tablet (4 mg total) by mouth every 8 (eight) hours as needed. (Patient not taking: Reported on 05/11/2023)   No facility-administered encounter medications on file as of 05/11/2023.    Review of Systems  Review of Systems  Constitutional: Negative.   HENT: Negative.     Cardiovascular: Negative.   Gastrointestinal: Negative.   Allergic/Immunologic: Negative.   Neurological: Negative.   Psychiatric/Behavioral: Negative.       Objective:   BP (!) 144/88   Pulse 87   Temp (!) 97.2 F (36.2 C)   Wt 178 lb (80.7 kg)   SpO2 98%   BMI 31.53 kg/m   Wt Readings from Last 5 Encounters:  05/11/23 178 lb (80.7 kg)  03/27/23 176 lb (79.8 kg)  02/07/23 175 lb (79.4 kg)  12/25/22 174 lb 2 oz (79 kg)  12/04/22 175 lb (79.4 kg)     Physical Exam Vitals and nursing note reviewed.  Constitutional:      General: She is not in acute distress.    Appearance: She is well-developed.  Cardiovascular:     Rate and Rhythm: Normal rate and regular rhythm.  Pulmonary:     Effort: Pulmonary effort is normal.     Breath sounds: Normal breath sounds.  Neurological:     Mental Status: She is alert and oriented to person, place, and time.       Assessment & Plan:   Essential hypertension -     CBC -     Comprehensive metabolic panel  Need for influenza vaccination -     Flu vaccine trivalent PF, 6mos and older(Flulaval,Afluria,Fluarix,Fluzone)  Prediabetes -     POCT glycosylated hemoglobin (Hb A1C)     Return in about 3 months (around 08/09/2023).     Ivonne Andrew, NP 05/11/2023

## 2023-05-11 NOTE — Patient Instructions (Addendum)
1. Essential hypertension (Primary)  - CBC - Comprehensive metabolic panel    Follow up:  Follow up in 3 months

## 2023-05-12 LAB — COMPREHENSIVE METABOLIC PANEL
ALT: 11 [IU]/L (ref 0–32)
AST: 10 [IU]/L (ref 0–40)
Albumin: 4.4 g/dL (ref 3.9–4.9)
Alkaline Phosphatase: 93 [IU]/L (ref 44–121)
BUN/Creatinine Ratio: 16 (ref 12–28)
BUN: 15 mg/dL (ref 8–27)
Bilirubin Total: 0.3 mg/dL (ref 0.0–1.2)
CO2: 24 mmol/L (ref 20–29)
Calcium: 9.9 mg/dL (ref 8.7–10.3)
Chloride: 105 mmol/L (ref 96–106)
Creatinine, Ser: 0.93 mg/dL (ref 0.57–1.00)
Globulin, Total: 2.2 g/dL (ref 1.5–4.5)
Glucose: 88 mg/dL (ref 70–99)
Potassium: 4.3 mmol/L (ref 3.5–5.2)
Sodium: 145 mmol/L — ABNORMAL HIGH (ref 134–144)
Total Protein: 6.6 g/dL (ref 6.0–8.5)
eGFR: 69 mL/min/{1.73_m2} (ref >59)

## 2023-05-12 LAB — CBC
Hematocrit: 38.8 % (ref 34.0–46.6)
Hemoglobin: 12.5 g/dL (ref 11.1–15.9)
MCH: 27.1 pg (ref 26.6–33.0)
MCHC: 32.2 g/dL (ref 31.5–35.7)
MCV: 84 fL (ref 79–97)
Platelets: 312 10*3/uL (ref 150–450)
RBC: 4.62 x10E6/uL (ref 3.77–5.28)
RDW: 14.2 % (ref 11.7–15.4)
WBC: 5.7 10*3/uL (ref 3.4–10.8)

## 2023-05-14 NOTE — Progress Notes (Signed)
My chart message was sent to pt after confirming last activity date.  Kh

## 2023-05-17 ENCOUNTER — Other Ambulatory Visit (INDEPENDENT_AMBULATORY_CARE_PROVIDER_SITE_OTHER): Payer: Medicare Other | Admitting: Pharmacist

## 2023-05-17 VITALS — BP 137/82

## 2023-05-17 DIAGNOSIS — Z13220 Encounter for screening for lipoid disorders: Secondary | ICD-10-CM

## 2023-05-17 DIAGNOSIS — I10 Essential (primary) hypertension: Secondary | ICD-10-CM

## 2023-05-17 NOTE — Patient Instructions (Addendum)
Shirrell,   It was great talking to you!  Please continue Repatha 140 mg every 14 days. We will follow up on your cholesterol at your next appointment.   Thanks!  Catie Eppie Gibson, PharmD, BCACP, CPP Clinical Pharmacist [facility] [phone]

## 2023-05-17 NOTE — Progress Notes (Signed)
05/17/2023 Name: Heidi Tran MRN: [id number] DOB: 12/18/1960  Chief Complaint  Patient presents with   Medication Management   Diabetes   Hypertension    Sara-Jane Geeter is a 62 y.o. year old female who presented for a telephone visit.   They were referred to the pharmacist by their PCP for assistance in managing diabetes, hypertension, and hyperlipidemia.    Subjective:  Care Team: Primary Care Provider: Ivonne Andrew, NP ; Next Scheduled Visit: 08/09/23  Medication Access/Adherence  Current Pharmacy:  [facility] [address], [address] [place] [address] Phone: [phone] Fax: [phone]  [id number] - [place], [place] - [address] [address] Phone: [phone] Fax: [phone]   Patient reports affordability concerns with their medications: No  Patient reports access/transportation concerns to their pharmacy: No  Patient reports adherence concerns with their medications:  No    Hypertension:  Current medications: triamterene/HCTZ  Patient has a validated, automated, upper arm home BP cuff Current blood pressure readings readings: readings in 130/70-80s at home  Patient denies hypotensive s/sx including dizziness, lightheadedness.  Patient denies hypertensive symptoms including headache, chest pain, shortness of breath   Hyperlipidemia/ASCVD Risk Reduction  Current lipid lowering medications: Repatha 140 mg every 2 weeks  Reports she is tolerating well. Notes some hand/toe cramps but less so than on prior lipid lowering therapy.   Medications tried in the past: rosuvastatin 40 mg daily - muscle cramps/spasms, atorvastatin- dose unknown, but also reports muscle spasms/cramps; ezetimibe 10 mg, simvastatin 40 mg - muscle spasms/cramps; pitavastatin 2 mg per chart review, but patient does not remember; gemfibrozil 600 mg    Objective:  Lab Results   Component Value Date   HGBA1C 6.5 (A) 05/11/2023    Lab Results  Component Value Date   CREATININE 0.93 05/11/2023   BUN 15 05/11/2023   NA 145 (H) 05/11/2023   K 4.3 05/11/2023   CL 105 05/11/2023   CO2 24 05/11/2023    Lab Results  Component Value Date   CHOL 277 (H) 11/06/2022   HDL 49 11/06/2022   LDLCALC 189 (H) 11/06/2022   TRIG 207 (H) 11/06/2022   CHOLHDL 5.7 (H) 11/06/2022    Medications Reviewed Today     Reviewed by Alden Hipp, RPH-CPP (Pharmacist) on 05/17/23 at 1307  Med List Status: <None>   Medication Order Taking? Sig Documenting Provider Last Dose Status Informant  albuterol (PROVENTIL) (2.5 MG/3ML) 0.083% nebulizer solution [id number] Yes TAKE 3 MLS (2.5 MG TOTAL) BY NEBULIZATION EVERY 6 (SIX) HOURS AS NEEDED. Ivonne Andrew, NP Taking Active   albuterol (VENTOLIN HFA) 108 (90 Base) MCG/ACT inhaler [id number] Yes Inhale 2 puffs into the lungs every 6 (six) hours as needed for wheezing or shortness of breath. Ivonne Andrew, NP Taking Active   diclofenac Sodium (VOLTAREN) 1 % GEL [id number]  Apply 2 g topically 4 (four) times daily. Ivonne Andrew, NP  Active   Evolocumab [facility] SURECLICK) 140 MG/ML Ivory Broad [id number] Yes Inject 140 mg into the skin every 14 (fourteen) days. Ivonne Andrew, NP Taking Active   fluticasone-salmeterol (ADVAIR DISKUS) 250-50 MCG/ACT AEPB [id number] Yes Inhale 1 puff into the lungs in the morning and at bedtime. Ivonne Andrew, NP Taking Active   hydrocortisone cream 1 % [id number] Yes Apply 1 application topically 2 (two) times daily. Kallie Locks, FNP Taking Active            Med  Note Kallie Locks   Tue Apr 29, 2019  3:17 PM) As needed.   montelukast (SINGULAIR) 10 MG tablet [id number] Yes Take 1 tablet (10 mg) by mouth at bedtime. Ivonne Andrew, NP Taking Active            Med Note Cornelius Moras, [place]   Tue Apr 10, 2023  2:23 PM) As needed  Multiple Vitamin (MULTIVITAMIN) capsule [id number] Yes Take 1 capsule  by mouth daily. Kallie Locks, FNP Taking Active   naproxen (NAPROSYN) 500 MG tablet [id number] Yes Take 1 tablet (500 mg total) by mouth 2 (two) times daily with a meal. Kallie Locks, FNP Taking Active   ondansetron (ZOFRAN-ODT) 4 MG disintegrating tablet [id number] No Take 1 tablet (4 mg total) by mouth every 8 (eight) hours as needed.  Patient not taking: Reported on 05/17/2023   Ivonne Andrew, NP Not Taking Active   potassium chloride SA (KLOR-CON M) 20 MEQ tablet [id number] Yes Take 1 tablet (20 mEq total) by mouth daily. Ivonne Andrew, NP Taking Active   triamterene-hydrochlorothiazide (MAXZIDE-25) 37.5-25 MG tablet [id number] Yes Take 1 tablet by mouth daily. Ivonne Andrew, NP Taking Active   Turmeric (QC TUMERIC COMPLEX PO) [id number] Yes Take by mouth. [provider] Taking Active            Med Note Cornelius Moras, [place]   Tue Apr 10, 2023  2:24 PM) Takes when arthritis is acting up (as needed)  Med List Note Maple Hudson, Rennis Chris, MD 01/[birth year]11 1944): Allergy vaccine 1:10 GO              Assessment/Plan:   Hypertension: - Currently controlled per home readings.  - Reviewed long term cardiovascular and renal outcomes of uncontrolled blood pressure - Reviewed appropriate blood pressure monitoring technique and reviewed goal blood pressure. Recommended to check home blood pressure and heart rate periodically, document, and provide at future appointments.  - Recommend to continue current regimen. Discussed that some cramping could be related to dehydration/electrolyte loss as a result of diuretic. Encouraged to continue potassium supplementation, consider magnesium multivitamin.   Hyperlipidemia/ASCVD Risk Reduction: - Currently uncontrolled but anticipate improvement.  - Recommend to continue current regimen at this time. Follow up lipids with next labs   Follow Up Plan: PCP visit in January as scheduled  Catie Eppie Gibson, PharmD, BCACP, CPP Clinical  Pharmacist [facility] [phone]

## 2023-05-24 ENCOUNTER — Other Ambulatory Visit: Payer: Self-pay | Admitting: Nurse Practitioner

## 2023-05-24 ENCOUNTER — Other Ambulatory Visit: Payer: Self-pay

## 2023-05-24 ENCOUNTER — Other Ambulatory Visit (HOSPITAL_COMMUNITY): Payer: Self-pay

## 2023-05-24 DIAGNOSIS — J4540 Moderate persistent asthma, uncomplicated: Secondary | ICD-10-CM

## 2023-05-24 MED ORDER — FLUTICASONE-SALMETEROL 250-50 MCG/ACT IN AEPB
1.0000 | INHALATION_SPRAY | Freq: Two times a day (BID) | RESPIRATORY_TRACT | 0 refills | Status: DC
  Filled 2023-05-24: qty 60, 30d supply, fill #0

## 2023-05-24 NOTE — Telephone Encounter (Signed)
Please advise KH 

## 2023-06-04 ENCOUNTER — Ambulatory Visit: Payer: Medicare Other | Admitting: Hematology

## 2023-06-04 ENCOUNTER — Other Ambulatory Visit: Payer: Medicare Other

## 2023-06-05 ENCOUNTER — Other Ambulatory Visit: Payer: Self-pay

## 2023-06-05 ENCOUNTER — Other Ambulatory Visit: Payer: Self-pay | Admitting: Nurse Practitioner

## 2023-06-05 DIAGNOSIS — I10 Essential (primary) hypertension: Secondary | ICD-10-CM

## 2023-06-05 MED ORDER — TRIAMTERENE-HCTZ 37.5-25 MG PO TABS
1.0000 | ORAL_TABLET | Freq: Every day | ORAL | 1 refills | Status: DC
  Filled 2023-06-05: qty 90, 90d supply, fill #0
  Filled 2023-06-28 – 2023-09-27 (×2): qty 90, 90d supply, fill #1

## 2023-06-28 ENCOUNTER — Other Ambulatory Visit: Payer: Self-pay | Admitting: Nurse Practitioner

## 2023-06-28 ENCOUNTER — Other Ambulatory Visit: Payer: Self-pay

## 2023-06-28 ENCOUNTER — Encounter: Payer: Self-pay | Admitting: Pharmacist

## 2023-06-28 ENCOUNTER — Other Ambulatory Visit (HOSPITAL_COMMUNITY): Payer: Self-pay

## 2023-06-28 DIAGNOSIS — J4540 Moderate persistent asthma, uncomplicated: Secondary | ICD-10-CM

## 2023-06-28 DIAGNOSIS — J45909 Unspecified asthma, uncomplicated: Secondary | ICD-10-CM

## 2023-06-28 MED ORDER — FLUTICASONE-SALMETEROL 250-50 MCG/ACT IN AEPB
1.0000 | INHALATION_SPRAY | Freq: Two times a day (BID) | RESPIRATORY_TRACT | 0 refills | Status: DC
  Filled 2023-06-28: qty 60, 30d supply, fill #0

## 2023-06-28 MED ORDER — ALBUTEROL SULFATE HFA 108 (90 BASE) MCG/ACT IN AERS
2.0000 | INHALATION_SPRAY | Freq: Four times a day (QID) | RESPIRATORY_TRACT | 12 refills | Status: AC | PRN
  Filled 2023-06-28: qty 6.7, 25d supply, fill #0

## 2023-06-29 ENCOUNTER — Other Ambulatory Visit: Payer: Self-pay

## 2023-06-29 ENCOUNTER — Other Ambulatory Visit (HOSPITAL_COMMUNITY): Payer: Self-pay

## 2023-06-29 MED ORDER — FLUTICASONE-SALMETEROL 250-50 MCG/ACT IN AEPB
1.0000 | INHALATION_SPRAY | Freq: Two times a day (BID) | RESPIRATORY_TRACT | 0 refills | Status: DC
  Filled 2023-06-29 – 2023-08-09 (×4): qty 60, 30d supply, fill #0

## 2023-06-29 NOTE — Telephone Encounter (Signed)
Need alternative . Please advise [facility]

## 2023-07-16 ENCOUNTER — Ambulatory Visit
Admission: RE | Admit: 2023-07-16 | Discharge: 2023-07-16 | Disposition: A | Discharge status: Home / Self Care | Payer: Medicare Other | Source: Ambulatory Visit | Attending: Nurse Practitioner | Admitting: Nurse Practitioner

## 2023-07-16 DIAGNOSIS — D0512 Intraductal carcinoma in situ of left breast: Secondary | ICD-10-CM

## 2023-07-19 ENCOUNTER — Other Ambulatory Visit (HOSPITAL_COMMUNITY): Payer: Self-pay

## 2023-07-19 ENCOUNTER — Other Ambulatory Visit: Payer: Self-pay

## 2023-07-19 ENCOUNTER — Telehealth: Payer: Self-pay

## 2023-07-19 ENCOUNTER — Encounter: Payer: Self-pay | Admitting: Pharmacist

## 2023-07-19 NOTE — Telephone Encounter (Signed)
 Please advise [facility]

## 2023-07-30 NOTE — Telephone Encounter (Unsigned)
 Copied from CRM [id number]. Topic: Clinical - Prescription Issue >> Jul 30, 2023 11:16 AM Marland Kitchen D wrote: Reason for CRM: Patient called in due to not being able to get her Advir inhaler due to cost she is requesting Dr. Archie Patten prescribe the generic.

## 2023-07-31 ENCOUNTER — Other Ambulatory Visit: Payer: Self-pay

## 2023-08-01 ENCOUNTER — Other Ambulatory Visit: Payer: Self-pay

## 2023-08-09 ENCOUNTER — Other Ambulatory Visit: Payer: Self-pay

## 2023-08-09 ENCOUNTER — Other Ambulatory Visit (HOSPITAL_COMMUNITY): Payer: Self-pay

## 2023-08-09 ENCOUNTER — Ambulatory Visit (INDEPENDENT_AMBULATORY_CARE_PROVIDER_SITE_OTHER): Payer: Self-pay | Admitting: Nurse Practitioner

## 2023-08-09 ENCOUNTER — Encounter: Payer: Self-pay | Admitting: Nurse Practitioner

## 2023-08-09 VITALS — BP 145/91 | HR 85 | Temp 97.9°F | Wt 179.2 lb

## 2023-08-09 DIAGNOSIS — Z1211 Encounter for screening for malignant neoplasm of colon: Secondary | ICD-10-CM

## 2023-08-09 DIAGNOSIS — J4540 Moderate persistent asthma, uncomplicated: Secondary | ICD-10-CM | POA: Diagnosis not present

## 2023-08-09 MED ORDER — BUDESONIDE-FORMOTEROL FUMARATE 160-4.5 MCG/ACT IN AERO
2.0000 | INHALATION_SPRAY | Freq: Two times a day (BID) | RESPIRATORY_TRACT | 3 refills | Status: AC
  Filled 2023-08-09 (×2): qty 10.2, 30d supply, fill #0

## 2023-08-09 NOTE — Patient Instructions (Signed)
 1. Colon cancer screening (Primary)  - Ambulatory referral to Gastroenterology  2. Moderate persistent asthma, unspecified whether complicated

## 2023-08-09 NOTE — Progress Notes (Signed)
 Subjective   Patient ID: Heidi Tran, female    DOB: Jul 04, 1960, 63 y.o.   MRN: [id number]  Chief Complaint  Patient presents with   Medical Management of Chronic Issues    Recovering from breast cancer  Follow up on BP and CHL    Referring provider: Ivonne Andrew, NP  Heidi Tran is a 63 y.o. female with Past Medical History: No date: Allergic rhinitis, cause unspecified No date: Asthma 12/2019: Hypercholesteremia No date: Hypertension 04/2019: Shoulder strain, left, initial encounter No date: Unspecified essential hypertension No date: Vitamin D deficiency   HPI  Hypertension:   Patient presents today for hypertension. She is compliant with taking Maxide. Denies f/c/s, n/v/d, hemoptysis, PND, leg swelling Denies chest pain or edema   Recently had lumpectomy and recently completed radiation treatments. Follows with oncology.  Stopped taking tamoxifen. Has notified oncology.  She states it was causing nausea. Reports that she has completed everything and is now cancer free.    Has not followed up with GI for positive colon cancer screening. Will get this scheduled in January     A1c is 6.5 today in office.  This is the same as last time.  We will continue to monitor.  Discussed diabetic diet in office today.   Denies f/c/s, hemoptysis, PND, leg swelling Denies chest pain or edema    Allergies  Allergen Reactions   Statins Other (See Comments)    Leg cramps   Crestor [Rosuvastatin] Other (See Comments)    Joint pain   Shellfish Allergy Swelling    Throat, lips and tongue swelling   Sulfonamide Derivatives     REACTION: rash   Tamoxifen Nausea Only    Burning of skin, esophagus and stomach    Immunization History  Administered Date(s) Administered   Influenza Split 02/17/2011, 03/12/2012, 01/27/2013, 02/26/2014   Influenza Whole 02/26/2009, 02/26/2010   Influenza, Seasonal, Injecte, Preservative Fre 05/11/2023   Influenza,inj,Quad PF,6+ Mos  07/17/2017, 02/18/2018, 01/31/2019, 03/18/2020, 03/24/2021, 05/04/2022   PFIZER(Purple Top)SARS-COV-2 Vaccination 08/19/2019, 09/16/2019, 04/26/2020   Pneumococcal Polysaccharide-23 05/29/2006    Tobacco History: Social History   Tobacco Use  Smoking Status Never  Smokeless Tobacco Never   Counseling given: Not Answered   Outpatient Encounter Medications as of 08/09/2023  Medication Sig   albuterol (PROVENTIL) (2.5 MG/3ML) 0.083% nebulizer solution TAKE 3 MLS (2.5 MG TOTAL) BY NEBULIZATION EVERY 6 (SIX) HOURS AS NEEDED.   albuterol (VENTOLIN HFA) 108 (90 Base) MCG/ACT inhaler Inhale 2 puffs into the lungs every 6 (six) hours as needed for wheezing or shortness of breath.   budesonide-formoterol (SYMBICORT) 160-4.5 MCG/ACT inhaler Inhale 2 puffs into the lungs 2 (two) times daily.   diclofenac Sodium (VOLTAREN) 1 % GEL Apply 2 g topically 4 (four) times daily.   Evolocumab (REPATHA SURECLICK) 140 MG/ML SOAJ Inject 140 mg into the skin every 14 (fourteen) days.   fluticasone-salmeterol (ADVAIR DISKUS) 250-50 MCG/ACT AEPB Inhale 1 puff into the lungs in the morning and at bedtime.   hydrocortisone cream 1 % Apply 1 application topically 2 (two) times daily.   montelukast (SINGULAIR) 10 MG tablet Take 1 tablet (10 mg) by mouth at bedtime.   Multiple Vitamin (MULTIVITAMIN) capsule Take 1 capsule by mouth daily.   naproxen (NAPROSYN) 500 MG tablet Take 1 tablet (500 mg total) by mouth 2 (two) times daily with a meal.   ondansetron (ZOFRAN-ODT) 4 MG disintegrating tablet Take 1 tablet (4 mg total) by mouth every 8 (eight) hours as needed.  potassium chloride SA (KLOR-CON M) 20 MEQ tablet Take 1 tablet (20 mEq total) by mouth daily.   triamterene-hydrochlorothiazide (MAXZIDE-25) 37.5-25 MG tablet Take 1 tablet by mouth daily.   Turmeric (QC TUMERIC COMPLEX PO) Take by mouth.   No facility-administered encounter medications on file as of 08/09/2023.    Review of Systems  Review of Systems   Constitutional: Negative.   HENT: Negative.    Cardiovascular: Negative.   Gastrointestinal: Negative.   Allergic/Immunologic: Negative.   Neurological: Negative.   Psychiatric/Behavioral: Negative.       Objective:   BP (!) 145/91   Pulse 85   Temp 97.9 F (36.6 C) (Oral)   Wt 179 lb 3.2 oz (81.3 kg)   SpO2 98%   BMI 31.74 kg/m   Wt Readings from Last 5 Encounters:  08/09/23 179 lb 3.2 oz (81.3 kg)  05/11/23 178 lb (80.7 kg)  03/27/23 176 lb (79.8 kg)  02/07/23 175 lb (79.4 kg)  12/25/22 174 lb 2 oz (79 kg)     Physical Exam Vitals and nursing note reviewed.  Constitutional:      General: She is not in acute distress.    Appearance: She is well-developed.  Cardiovascular:     Rate and Rhythm: Normal rate and regular rhythm.  Pulmonary:     Effort: Pulmonary effort is normal.     Breath sounds: Normal breath sounds.  Neurological:     Mental Status: She is alert and oriented to person, place, and time.       Assessment & Plan:   Colon cancer screening -     Ambulatory referral to Gastroenterology -     CBC -     Comprehensive metabolic panel  Moderate persistent asthma, unspecified whether complicated -     CBC -     Comprehensive metabolic panel  Other orders -     Budesonide-Formoterol Fumarate; Inhale 2 puffs into the lungs 2 (two) times daily.  Dispense: 10.2 g; Refill: 3     Return in about 6 months (around 02/09/2024).   Ivonne Andrew, NP 08/09/2023

## 2023-08-10 ENCOUNTER — Other Ambulatory Visit: Payer: Self-pay

## 2023-08-10 LAB — CBC
Hematocrit: 40.1 % (ref 34.0–46.6)
Hemoglobin: 12.7 g/dL (ref 11.1–15.9)
MCH: 26 pg — ABNORMAL LOW (ref 26.6–33.0)
MCHC: 31.7 g/dL (ref 31.5–35.7)
MCV: 82 fL (ref 79–97)
Platelets: 284 10*3/uL (ref 150–450)
RBC: 4.88 x10E6/uL (ref 3.77–5.28)
RDW: 13.7 % (ref 11.7–15.4)
WBC: 4.5 10*3/uL (ref 3.4–10.8)

## 2023-08-10 LAB — COMPREHENSIVE METABOLIC PANEL
ALT: 9 IU/L (ref 0–32)
AST: 14 IU/L (ref 0–40)
Albumin: 4.4 g/dL (ref 3.9–4.9)
Alkaline Phosphatase: 97 IU/L (ref 44–121)
BUN/Creatinine Ratio: 12 (ref 12–28)
BUN: 11 mg/dL (ref 8–27)
Bilirubin Total: 0.3 mg/dL (ref 0.0–1.2)
CO2: 24 mmol/L (ref 20–29)
Calcium: 9.8 mg/dL (ref 8.7–10.3)
Chloride: 101 mmol/L (ref 96–106)
Creatinine, Ser: 0.95 mg/dL (ref 0.57–1.00)
Globulin, Total: 2.6 g/dL (ref 1.5–4.5)
Glucose: 107 mg/dL — ABNORMAL HIGH (ref 70–99)
Potassium: 3.8 mmol/L (ref 3.5–5.2)
Sodium: 139 mmol/L (ref 134–144)
Total Protein: 7 g/dL (ref 6.0–8.5)
eGFR: 67 mL/min/{1.73_m2} (ref >59)

## 2023-08-29 ENCOUNTER — Encounter: Payer: Self-pay | Admitting: Pediatrics

## 2023-08-31 ENCOUNTER — Other Ambulatory Visit: Payer: Self-pay

## 2023-09-03 ENCOUNTER — Other Ambulatory Visit: Payer: Self-pay

## 2023-09-03 DIAGNOSIS — D0512 Intraductal carcinoma in situ of left breast: Secondary | ICD-10-CM

## 2023-09-03 NOTE — Assessment & Plan Note (Signed)
 [id number], stage 0 -Diagnosed in February 2024 -She underwent left breast lumpectomy, and a reexcision for positive margin.  I reviewed her surgical pathology findings with patient.  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 -s/p adjuvant radiation -I recommend low-dose tamoxifen 5 mg daily for 3 years. She started in 11/2022 but stopped after 3-4 weeks due to intolerable AEs -I recommend cancer surveillance with annual mammogram and breast MRI

## 2023-09-04 ENCOUNTER — Inpatient Hospital Stay (HOSPITAL_BASED_OUTPATIENT_CLINIC_OR_DEPARTMENT_OTHER): Payer: Medicare Other | Admitting: Hematology

## 2023-09-04 ENCOUNTER — Inpatient Hospital Stay: Payer: Medicare Other | Attending: Hematology

## 2023-09-04 ENCOUNTER — Encounter: Payer: Self-pay | Admitting: Hematology

## 2023-09-04 VITALS — BP 136/79 | HR 84 | Temp 98.0°F | Resp 18 | Ht 62.5 in | Wt 180.2 lb

## 2023-09-04 DIAGNOSIS — Z791 Long term (current) use of non-steroidal anti-inflammatories (NSAID): Secondary | ICD-10-CM | POA: Insufficient documentation

## 2023-09-04 DIAGNOSIS — E785 Hyperlipidemia, unspecified: Secondary | ICD-10-CM | POA: Insufficient documentation

## 2023-09-04 DIAGNOSIS — Z79899 Other long term (current) drug therapy: Secondary | ICD-10-CM | POA: Diagnosis not present

## 2023-09-04 DIAGNOSIS — E7800 Pure hypercholesterolemia, unspecified: Secondary | ICD-10-CM | POA: Diagnosis not present

## 2023-09-04 DIAGNOSIS — I10 Essential (primary) hypertension: Secondary | ICD-10-CM | POA: Diagnosis not present

## 2023-09-04 DIAGNOSIS — D0512 Intraductal carcinoma in situ of left breast: Secondary | ICD-10-CM | POA: Diagnosis present

## 2023-09-04 DIAGNOSIS — Z7951 Long term (current) use of inhaled steroids: Secondary | ICD-10-CM | POA: Diagnosis not present

## 2023-09-04 DIAGNOSIS — E559 Vitamin D deficiency, unspecified: Secondary | ICD-10-CM | POA: Diagnosis not present

## 2023-09-04 DIAGNOSIS — M5430 Sciatica, unspecified side: Secondary | ICD-10-CM | POA: Insufficient documentation

## 2023-09-04 LAB — CMP (CANCER CENTER ONLY)
ALT: 10 U/L (ref 0–44)
AST: 13 U/L — ABNORMAL LOW (ref 15–41)
Albumin: 4.4 g/dL (ref 3.5–5.0)
Alkaline Phosphatase: 83 U/L (ref 38–126)
Anion gap: 6 (ref 5–15)
BUN: 14 mg/dL (ref 8–23)
CO2: 28 mmol/L (ref 22–32)
Calcium: 9.6 mg/dL (ref 8.9–10.3)
Chloride: 105 mmol/L (ref 98–111)
Creatinine: 0.89 mg/dL (ref 0.44–1.00)
GFR, Estimated: >60 mL/min (ref >60)
Glucose, Bld: 97 mg/dL (ref 70–99)
Potassium: 3.3 mmol/L — ABNORMAL LOW (ref 3.5–5.1)
Sodium: 139 mmol/L (ref 135–145)
Total Bilirubin: 0.5 mg/dL (ref 0.0–1.2)
Total Protein: 7.5 g/dL (ref 6.5–8.1)

## 2023-09-04 LAB — CBC WITH DIFFERENTIAL (CANCER CENTER ONLY)
Abs Immature Granulocytes: 0.02 10*3/uL (ref 0.00–0.07)
Basophils Absolute: 0.1 10*3/uL (ref 0.0–0.1)
Basophils Relative: 1 %
Eosinophils Absolute: 0.2 10*3/uL (ref 0.0–0.5)
Eosinophils Relative: 3 %
HCT: 36.4 % (ref 36.0–46.0)
Hemoglobin: 12.3 g/dL (ref 12.0–15.0)
Immature Granulocytes: 0 %
Lymphocytes Relative: 34 %
Lymphs Abs: 1.8 10*3/uL (ref 0.7–4.0)
MCH: 26.5 pg (ref 26.0–34.0)
MCHC: 33.8 g/dL (ref 30.0–36.0)
MCV: 78.3 fL — ABNORMAL LOW (ref 80.0–100.0)
Monocytes Absolute: 0.5 10*3/uL (ref 0.1–1.0)
Monocytes Relative: 9 %
Neutro Abs: 2.8 10*3/uL (ref 1.7–7.7)
Neutrophils Relative %: 53 %
Platelet Count: 275 10*3/uL (ref 150–400)
RBC: 4.65 MIL/uL (ref 3.87–5.11)
RDW: 14 % (ref 11.5–15.5)
WBC Count: 5.4 10*3/uL (ref 4.0–10.5)
nRBC: 0 % (ref 0.0–0.2)

## 2023-09-04 NOTE — Progress Notes (Signed)
 [facility]   [phone] [phone]   Clinic Follow up Note   Patient Care Team: Ivonne Andrew, NP as PCP - General (Pulmonary Disease) Harriette Bouillon, MD as Consulting Physician (General Surgery) Malachy Mood, MD as Consulting Physician (Hematology) Antony Blackbird, MD as Consulting Physician (Radiation Oncology) Donnelly Angelica, RN as Oncology Nurse Navigator Pershing Proud, RN as Oncology Nurse Navigator Alden Hipp, RPH-CPP (Pharmacist) Pollyann Samples, NP as Nurse Practitioner (Nurse Practitioner)  Date of Service:  09/04/2023  CHIEF COMPLAINT: f/u of breast DCIS  CURRENT THERAPY:  Cancer surveillance  Oncology History   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  I reviewed her surgical pathology findings with patient.  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 -s/p adjuvant radiation -I recommend low-dose tamoxifen 5 mg daily for 3 years. 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 Underwent surgery and radiation therapy. Tamoxifen was not tolerated and discontinued. Current status is non-invasive with no recurrence. Mammogram in February 2023 was benign. Breast tenderness likely due to scar tissue from surgery and radiation. No new concerns or abnormalities on physical exam. She is satisfied with current surveillance plan and does not wish to pursue additional screening such as breast MRI at this time. - Continue annual mammogram screenings - Perform regular self-breast exams - Follow up with family doctor or  gynecologist for mammogram orders - Contact oncology team if any new concerns arise  Hyperlipidemia Previously on Crestor, which was not tolerated. Currently managing cholesterol with Repatha injections. - Continue Repatha injections as prescribed  Sciatica Chronic condition present for years. No new symptoms or concerns discussed.  Plan -She is clinically doing well, exam was negative except surgical scars in the left breast. -Continue breast cancer surveillance with annual mammogram and self-exam -I will see her as needed in the future   SUMMARY OF ONCOLOGIC HISTORY: Oncology History Overview Note   Cancer Staging  Ductal carcinoma in situ (DCIS) of left breast Staging form: Breast, AJCC 8th Edition - Clinical stage from 07/26/2022: Stage 0 (cTis (DCIS), cN0, cM0, ER+, PR+) - Unsigned Stage prefix: Initial diagnosis Nuclear grade: G3 - Pathologic stage from 08/18/2022: Stage 0 (pTis (DCIS), pN0, cM0, G3, ER+, PR+, HER2: Not Assessed) - Signed by Malachy Mood, MD on 12/03/2022 Histologic grading system: 3 grade system     Ductal carcinoma in situ (DCIS) of left breast  07/24/2022 Initial Diagnosis   Ductal carcinoma in situ (DCIS) of left breast   08/02/2022 Genetic Testing   Negative [facility] Common Hereditary Cancers +RNA Panel.  Report date is 08/03/2022.    The [facility] Common Hereditary Cancers + RNA Panel includes sequencing, deletion/duplication, and RNA analysis of the following 48 genes: APC, ATM, AXIN2, BAP1, BARD1, BMPR1A, BRCA1, BRCA2, BRIP1, CDH1, CDK4*, CDKN2A*, CHEK2, CTNNA1, DICER1, EPCAM* (del/dup only), FH, GREM1* (promoter dup analysis only), HOXB13*, KIT*, MBD4*, MEN1, MLH1, MSH2, MSH3, MSH6, MUTYH, NF1, NTHL1, PALB2, PDGFRA*, PMS2, POLD1, POLE, PTEN, RAD51C, RAD51D, SDHA (sequencing only), SDHB, SDHC, SDHD, SMAD4, SMARCA4, STK11, TP53, TSC1, TSC2, VHL.  *Genes without RNA analysis.    08/18/2022 Cancer Staging   Staging  form: Breast, AJCC 8th Edition - Pathologic stage  from 08/18/2022: Stage 0 (pTis (DCIS), pN0, cM0, G3, ER+, PR+, HER2: Not Assessed) - Signed by Malachy Mood, MD on 12/03/2022 Histologic grading system: 3 grade system      Discussed the use of AI scribe software for clinical note transcription with the patient, who gave verbal consent to proceed.  History of Present Illness The patient, a 63 year old with a history of breast cancer, presents for a routine follow-up. She reports that her overall health has been good since her last visit. She notes some residual tenderness in the area of her previous surgical incision, which she manages with heat and ice. She has not noticed any new or concerning symptoms related to her breasts.  The patient's breast cancer was diagnosed in February 2020 and was identified as Ductal Carcinoma In Situ (DCIS). She underwent surgery and radiation therapy. She attempted treatment with tamoxifen but did not tolerate it well and subsequently discontinued it. She continues to perform regular self-breast exams and has annual mammograms.  In addition to her breast cancer history, the patient has hypercholesterolemia. She was previously on Crestor, which she could not tolerate. She is now on Repatha, an injectable medication for cholesterol management. She also reports a long-standing history of sciatica.     All other systems were reviewed with the patient and are negative.  MEDICAL HISTORY:  Past Medical History:  Diagnosis Date   Allergic rhinitis, cause unspecified    Asthma    Hypercholesteremia 12/2019   Hypertension    Shoulder strain, left, initial encounter 04/2019   Unspecified essential hypertension    Vitamin D deficiency     SURGICAL HISTORY: Past Surgical History:  Procedure Laterality Date   ABDOMINAL HYSTERECTOMY     BREAST BIOPSY Left 07/18/2022   [place] LT BREAST BX W LOC DEV 1ST LESION IMG BX SPEC US GUIDE 07/18/2022 [facility]   BREAST BIOPSY Left 07/18/2022   [place] LT BREAST BX W LOC DEV EA  ADD LESION IMG BX SPEC US GUIDE 07/18/2022 [facility]   BREAST BIOPSY  08/15/2022   MM LT RADIOACTIVE SEED EA ADD LESION LOC MAMMO GUIDE 08/15/2022 [facility]   BREAST BIOPSY  08/15/2022   MM LT RADIOACTIVE SEED LOC MAMMO GUIDE 08/15/2022 [facility]   BREAST LUMPECTOMY WITH RADIOACTIVE SEED LOCALIZATION Left 08/18/2022   Procedure: LEFT BREAST BRACKETED LUMPECTOMY WITH RADIOACTIVE SEED LOCALIZATION;  Surgeon: Harriette Bouillon, MD;  Location: [facility];  Service: General;  Laterality: Left;   RE-EXCISION OF BREAST LUMPECTOMY Left 09/21/2022   Procedure: RE-EXCISION OF LEFT BREAST LUMPECTOMY;  Surgeon: Harriette Bouillon, MD;  Location: [facility];  Service: General;  Laterality: Left;   SENTINEL NODE BIOPSY Left 09/21/2022   Procedure: LEFTSENTINEL LYMPH NODE MAPPING;  Surgeon: Harriette Bouillon, MD;  Location: [facility];  Service: General;  Laterality: Left;   TOTAL ABDOMINAL HYSTERECTOMY W/ BILATERAL SALPINGOOPHORECTOMY      I have reviewed the social history and family history with the patient and they are unchanged from previous note.  ALLERGIES:  is allergic to statins, crestor [rosuvastatin], shellfish allergy, sulfonamide derivatives, and tamoxifen.  MEDICATIONS:  Current Outpatient Medications  Medication Sig Dispense Refill   albuterol (PROVENTIL) (2.5 MG/3ML) 0.083% nebulizer solution TAKE 3 MLS (2.5 MG TOTAL) BY NEBULIZATION EVERY 6 (SIX) HOURS AS NEEDED. 360 mL 3   albuterol (VENTOLIN HFA) 108 (90 Base) MCG/ACT inhaler Inhale 2 puffs into the lungs every 6 (six) hours as  needed for wheezing or shortness of breath. 6.7 g 12   budesonide-formoterol (SYMBICORT) 160-4.5 MCG/ACT inhaler Inhale 2 puffs into the lungs 2 (two) times daily. 10.2 g 3   diclofenac Sodium (VOLTAREN) 1 % GEL Apply 2 g topically 4 (four) times daily. 100 g 2   Evolocumab (REPATHA SURECLICK) 140 MG/ML SOAJ Inject 140 mg into the skin every 14 (fourteen)  days. 6 mL 1   fluticasone-salmeterol (ADVAIR DISKUS) 250-50 MCG/ACT AEPB Inhale 1 puff into the lungs in the morning and at bedtime. 60 each 0   hydrocortisone cream 1 % Apply 1 application topically 2 (two) times daily. 30 g 3   montelukast (SINGULAIR) 10 MG tablet Take 1 tablet (10 mg) by mouth at bedtime. 30 tablet 3   Multiple Vitamin (MULTIVITAMIN) capsule Take 1 capsule by mouth daily. 30 capsule 6   naproxen (NAPROSYN) 500 MG tablet Take 1 tablet (500 mg total) by mouth 2 (two) times daily with a meal. 180 tablet 1   ondansetron (ZOFRAN-ODT) 4 MG disintegrating tablet Take 1 tablet (4 mg total) by mouth every 8 (eight) hours as needed. 20 tablet 0   potassium chloride SA (KLOR-CON M) 20 MEQ tablet Take 1 tablet (20 mEq total) by mouth daily. 90 tablet 0   triamterene-hydrochlorothiazide (MAXZIDE-25) 37.5-25 MG tablet Take 1 tablet by mouth daily. 90 tablet 1   Turmeric (QC TUMERIC COMPLEX PO) Take by mouth.     No current facility-administered medications for this visit.    PHYSICAL EXAMINATION: ECOG PERFORMANCE STATUS: 0 - Asymptomatic  Vitals:   09/04/23 1233  BP: 136/79  Pulse: 84  Resp: 18  Temp: 98 F (36.7 C)  SpO2: 100%   Wt Readings from Last 3 Encounters:  09/04/23 180 lb 3.2 oz (81.7 kg)  08/09/23 179 lb 3.2 oz (81.3 kg)  05/11/23 178 lb (80.7 kg)     GENERAL:alert, no distress and comfortable SKIN: skin color, texture, turgor are normal, no rashes or significant lesions EYES: normal, Conjunctiva are pink and non-injected, sclera clear NECK: supple, thyroid normal size, non-tender, without nodularity LYMPH:  no palpable lymphadenopathy in the cervical, axillary  LUNGS: clear to auscultation and percussion with normal breathing effort HEART: regular rate & rhythm and no murmurs and no lower extremity edema ABDOMEN:abdomen soft, non-tender and normal bowel sounds Musculoskeletal:no cyanosis of digits and no clubbing  NEURO: alert & oriented x 3 with fluent  speech, no focal motor/sensory deficits  Physical Exam BREAST: The left breast is smaller due to surgery and radiation, with scar tissue from the incision and tenderness due to scar tissue. No abnormal findings in the breast exam.  LABORATORY DATA:  I have reviewed the data as listed    Latest Ref Rng & Units 09/04/2023   12:20 PM 08/09/2023   10:13 AM 05/11/2023   10:49 AM  CBC  WBC 4.0 - 10.5 K/uL 5.4  4.5  5.7   Hemoglobin 12.0 - 15.0 g/dL 16.1  09.6  04.5   Hematocrit 36.0 - 46.0 % 36.4  40.1  38.8   Platelets 150 - 400 K/uL 275  284  312         Latest Ref Rng & Units 08/09/2023   10:13 AM 05/11/2023   10:49 AM 11/06/2022   11:12 AM  CMP  Glucose 70 - 99 mg/dL 409  88  811   BUN 8 - 27 mg/dL 11  15  17    Creatinine 0.57 - 1.00 mg/dL 9.14  7.82  9.56  Sodium 134 - 144 mmol/L 139  145  138   Potassium 3.5 - 5.2 mmol/L 3.8  4.3  3.7   Chloride 96 - 106 mmol/L 101  105  100   CO2 20 - 29 mmol/L 24  24  24    Calcium 8.7 - 10.3 mg/dL 9.8  9.9  9.8   Total Protein 6.0 - 8.5 g/dL 7.0  6.6  7.1   Total Bilirubin 0.0 - 1.2 mg/dL 0.3  0.3  0.2   Alkaline Phos 44 - 121 IU/L 97  93  100   AST 0 - 40 IU/L 14  10  14    ALT 0 - 32 IU/L 9  11  15        RADIOGRAPHIC STUDIES: I have personally reviewed the radiological images as listed and agreed with the findings in the report. No results found.    No orders of the defined types were placed in this encounter.  All questions were answered. The patient knows to call the clinic with any problems, questions or concerns. No barriers to learning was detected. The total time spent in the appointment was 20 minutes.     Malachy Mood, MD 09/04/2023

## 2023-09-27 ENCOUNTER — Other Ambulatory Visit: Payer: Self-pay

## 2023-09-27 ENCOUNTER — Other Ambulatory Visit: Payer: Self-pay | Admitting: Nurse Practitioner

## 2023-09-27 ENCOUNTER — Other Ambulatory Visit (HOSPITAL_COMMUNITY): Payer: Self-pay

## 2023-09-27 DIAGNOSIS — J4540 Moderate persistent asthma, uncomplicated: Secondary | ICD-10-CM

## 2023-09-27 MED ORDER — FLUTICASONE-SALMETEROL 250-50 MCG/ACT IN AEPB
1.0000 | INHALATION_SPRAY | Freq: Two times a day (BID) | RESPIRATORY_TRACT | 0 refills | Status: DC
  Filled 2023-09-27: qty 60, 30d supply, fill #0

## 2023-09-27 MED ORDER — POTASSIUM CHLORIDE CRYS ER 20 MEQ PO TBCR
20.0000 meq | EXTENDED_RELEASE_TABLET | Freq: Every day | ORAL | 0 refills | Status: DC
  Filled 2023-09-27: qty 90, 90d supply, fill #0

## 2023-09-28 ENCOUNTER — Other Ambulatory Visit: Payer: Self-pay

## 2023-09-28 ENCOUNTER — Encounter: Payer: Self-pay | Admitting: Pediatrics

## 2023-09-28 ENCOUNTER — Other Ambulatory Visit (HOSPITAL_COMMUNITY): Payer: Self-pay

## 2023-09-28 ENCOUNTER — Encounter: Payer: Self-pay | Admitting: *Deleted

## 2023-09-28 ENCOUNTER — Ambulatory Visit (AMBULATORY_SURGERY_CENTER)

## 2023-09-28 ENCOUNTER — Telehealth: Payer: Self-pay | Admitting: Pediatrics

## 2023-09-28 VITALS — Ht 62.5 in | Wt 170.0 lb

## 2023-09-28 DIAGNOSIS — Z1211 Encounter for screening for malignant neoplasm of colon: Secondary | ICD-10-CM

## 2023-09-28 MED ORDER — NA SULFATE-K SULFATE-MG SULF 17.5-3.13-1.6 GM/177ML PO SOLN
1.0000 | Freq: Once | ORAL | 0 refills | Status: AC
  Filled 2023-09-28 (×2): qty 354, 1d supply, fill #0

## 2023-09-28 NOTE — Telephone Encounter (Signed)
 Inbound call from patient, states prep medication is not covered by insurance, patient would like miralax and dulcolax prep instead.

## 2023-09-28 NOTE — Progress Notes (Signed)
 No egg or soy allergy  known to patient  No issues known to pt with past sedation with any surgeries or procedures Patient denies ever being told they had issues or difficulty with intubation  No FH of Malignant Hyperthermia Pt is not on diet pills Pt is not on  home 02  Pt is not on blood thinners  Constipation: yes  No A fib or A flutter Have any cardiac testing pending-- no  LOA: independent  Prep: suprep   Patient's chart reviewed by Rogena Class CNRA prior to previsit and patient appropriate for the [facility].  Previsit completed and red dot placed by patient's name on their procedure day (on provider's schedule).     PV completed with patient. Prep instructions sent via mychart and home address.

## 2023-09-28 NOTE — Telephone Encounter (Signed)
 Error duplicate encounter

## 2023-09-28 NOTE — Telephone Encounter (Signed)
 Returned patient call.  Reached voicemail.  Left message advising her prep can be changed to Miralax/ducolax and new instructions would be forwarded through MyChart.  Also advised patient to call back if any other questions or concerns.

## 2023-10-18 NOTE — Progress Notes (Signed)
 [facility] History and Physical   Primary Care Physician:  Jerrlyn Morel, NP   Reason for Procedure:  Cologuard positive 08/2022  Plan:    Colonoscopy     HPI: Heidi Tran is a 63 y.o. female undergoing colonoscopy for evaluation of positive Cologuard performed in April 2024.  The patient has never had a colonoscopy.  There is no documented family history of colorectal cancer or polyps.  Patient denies current symptoms of change in bowel habits or rectal bleeding.   Past Medical History:  Diagnosis Date   Allergic rhinitis, cause unspecified    Asthma    Hypercholesteremia 12/2019   Hypertension    Shoulder strain, left, initial encounter 04/2019   Unspecified essential hypertension    Vitamin D  deficiency     Past Surgical History:  Procedure Laterality Date   ABDOMINAL HYSTERECTOMY     BREAST BIOPSY Left 07/18/2022   US  LT BREAST BX W LOC DEV 1ST LESION IMG BX SPEC US  GUIDE 07/18/2022 [facility]   BREAST BIOPSY Left 07/18/2022   US  LT BREAST BX W LOC DEV EA ADD LESION IMG BX SPEC US  GUIDE 07/18/2022 [facility]   BREAST BIOPSY  08/15/2022   MM LT RADIOACTIVE SEED EA ADD LESION LOC MAMMO GUIDE 08/15/2022 [facility]   BREAST BIOPSY  08/15/2022   MM LT RADIOACTIVE SEED LOC MAMMO GUIDE 08/15/2022 [facility]   BREAST LUMPECTOMY WITH RADIOACTIVE SEED LOCALIZATION Left 08/18/2022   Procedure: LEFT BREAST BRACKETED LUMPECTOMY WITH RADIOACTIVE SEED LOCALIZATION;  Surgeon: Sim Dryer, MD;  Location: [facility];  Service: General;  Laterality: Left;   RE-EXCISION OF BREAST LUMPECTOMY Left 09/21/2022   Procedure: RE-EXCISION OF LEFT BREAST LUMPECTOMY;  Surgeon: Sim Dryer, MD;  Location: [facility];  Service: General;  Laterality: Left;   SENTINEL NODE BIOPSY Left 09/21/2022   Procedure: LEFTSENTINEL LYMPH NODE MAPPING;  Surgeon: Sim Dryer, MD;  Location: [facility];  Service:  General;  Laterality: Left;   TOTAL ABDOMINAL HYSTERECTOMY W/ BILATERAL SALPINGOOPHORECTOMY      Prior to Admission medications   Medication Sig Start Date End Date Taking? Authorizing Provider  albuterol  (PROVENTIL ) (2.5 MG/3ML) 0.083% nebulizer solution TAKE 3 MLS (2.5 MG TOTAL) BY NEBULIZATION EVERY 6 (SIX) HOURS AS NEEDED. 11/06/22   Jerrlyn Morel, NP  albuterol  (VENTOLIN  HFA) 108 (90 Base) MCG/ACT inhaler Inhale 2 puffs into the lungs every 6 (six) hours as needed for wheezing or shortness of breath. 06/28/23   Nichols, Tonya S, NP  budesonide -formoterol  (SYMBICORT ) 160-4.5 MCG/ACT inhaler Inhale 2 puffs into the lungs 2 (two) times daily. 08/09/23   Nichols, Tonya S, NP  diclofenac  Sodium (VOLTAREN ) 1 % GEL Apply 2 g topically 4 (four) times daily. 02/07/23   Jerrlyn Morel, NP  Evolocumab  (REPATHA  SURECLICK) 140 MG/ML SOAJ Inject 140 mg into the skin every 14 (fourteen) days. 04/12/23   Jerrlyn Morel, NP  fluticasone -salmeterol (ADVAIR  DISKUS) 250-50 MCG/ACT AEPB Inhale 1 puff into the lungs in the morning and at bedtime. 09/27/23   Jerrlyn Morel, NP  hydrocortisone  cream 1 % Apply 1 application topically 2 (two) times daily. 01/31/19   Stroud, Natalie M, FNP  montelukast  (SINGULAIR ) 10 MG tablet Take 1 tablet (10 mg) by mouth at bedtime. 12/11/22   Nichols, Tonya S, NP  Multiple Vitamin (MULTIVITAMIN) capsule Take 1 capsule by mouth daily. 01/31/19   Stroud, Natalie M, FNP  naproxen  (NAPROSYN ) 500 MG tablet Take 1 tablet (500 mg total)  by mouth 2 (two) times daily with a meal. 05/18/20   Maxene Span, FNP  ondansetron  (ZOFRAN -ODT) 4 MG disintegrating tablet Take 1 tablet (4 mg total) by mouth every 8 (eight) hours as needed. Patient not taking: Reported on 09/28/2023 02/07/23   Jerrlyn Morel, NP  potassium chloride  SA (KLOR-CON  M) 20 MEQ tablet Take 1 tablet (20 mEq total) by mouth daily. 09/27/23   Nichols, Tonya S, NP  triamterene -hydrochlorothiazide  (MAXZIDE -25) 37.5-25 MG tablet  Take 1 tablet by mouth daily. 06/05/23   Nichols, Tonya S, NP  Turmeric (QC TUMERIC COMPLEX PO) Take by mouth.    [provider]    Current Outpatient Medications  Medication Sig Dispense Refill   albuterol  (PROVENTIL ) (2.5 MG/3ML) 0.083% nebulizer solution TAKE 3 MLS (2.5 MG TOTAL) BY NEBULIZATION EVERY 6 (SIX) HOURS AS NEEDED. 360 mL 3   albuterol  (VENTOLIN  HFA) 108 (90 Base) MCG/ACT inhaler Inhale 2 puffs into the lungs every 6 (six) hours as needed for wheezing or shortness of breath. 6.7 g 12   fluticasone -salmeterol (ADVAIR  DISKUS) 250-50 MCG/ACT AEPB Inhale 1 puff into the lungs in the morning and at bedtime. 60 each 0   montelukast  (SINGULAIR ) 10 MG tablet Take 1 tablet (10 mg) by mouth at bedtime. 30 tablet 3   Multiple Vitamin (MULTIVITAMIN) capsule Take 1 capsule by mouth daily. 30 capsule 6   triamterene -hydrochlorothiazide  (MAXZIDE -25) 37.5-25 MG tablet Take 1 tablet by mouth daily. 90 tablet 1   budesonide -formoterol  (SYMBICORT ) 160-4.5 MCG/ACT inhaler Inhale 2 puffs into the lungs 2 (two) times daily. (Patient not taking: Reported on 10/19/2023) 10.2 g 3   diclofenac  Sodium (VOLTAREN ) 1 % GEL Apply 2 g topically 4 (four) times daily. 100 g 2   Evolocumab  (REPATHA  SURECLICK) 140 MG/ML SOAJ Inject 140 mg into the skin every 14 (fourteen) days. 6 mL 1   hydrocortisone  cream 1 % Apply 1 application topically 2 (two) times daily. 30 g 3   naproxen  (NAPROSYN ) 500 MG tablet Take 1 tablet (500 mg total) by mouth 2 (two) times daily with a meal. 180 tablet 1   ondansetron  (ZOFRAN -ODT) 4 MG disintegrating tablet Take 1 tablet (4 mg total) by mouth every 8 (eight) hours as needed. (Patient not taking: Reported on 10/19/2023) 20 tablet 0   potassium chloride  SA (KLOR-CON  M) 20 MEQ tablet Take 1 tablet (20 mEq total) by mouth daily. 90 tablet 0   Turmeric (QC TUMERIC COMPLEX PO) Take by mouth.     Current Facility-Administered Medications  Medication Dose Route Frequency Provider Last  Rate Last Admin   0.9 %  sodium chloride  infusion  500 mL Intravenous Once Netty Sullivant M, MD        Allergies as of 10/19/2023 - Review Complete 10/19/2023  Allergen Reaction Noted   Shellfish allergy  Swelling 08/01/2021   Statins Other (See Comments) 09/04/2014   Tamoxifen  Nausea Only 03/27/2023   Crestor  [rosuvastatin ] Other (See Comments) 03/27/2023   Sulfonamide derivatives Rash     Family History  Problem Relation Age of Onset   Breast cancer Mother 66       d. 49   Prostate cancer Father        dx 41s   Heart disease Father    Diabetes Father    Bone cancer Paternal Aunt 9       or other primary?   Cancer Paternal Aunt        appendix; early stage; dx after 74   Cancer Paternal Aunt  unknown type; dx 30s; ? early stage/minimal treatment   Colon cancer Neg Hx    Rectal cancer Neg Hx    Stomach cancer Neg Hx     Social History   Socioeconomic History   Marital status: Single    Spouse name: Not on file   Number of children: Not on file   Years of education: Not on file   Highest education level: 12th grade  Occupational History   Occupation: Diplomatic Services operational officer  Tobacco Use   Smoking status: Never   Smokeless tobacco: Never  Vaping Use   Vaping status: Never Used  Substance and Sexual Activity   Alcohol use: Never   Drug use: Never   Sexual activity: Not Currently  Other Topics Concern   Not on file  Social History Narrative   Not on file   Social Drivers of Health   Financial Resource Strain: Low Risk  (05/08/2023)   Overall Financial Resource Strain (CARDIA)    Difficulty of Paying Living Expenses: Not hard at all  Food Insecurity: No Food Insecurity (05/08/2023)   Hunger Vital Sign    Worried About Running Out of Food in the Last Year: Never true    Ran Out of Food in the Last Year: Never true  Transportation Needs: No Transportation Needs (08/05/2023)   PRAPARE - Administrator, Civil Service (Medical): No    Lack of Transportation  (Non-Medical): No  Physical Activity: Insufficiently Active (08/05/2023)   Exercise Vital Sign    Days of Exercise per Week: 7 days    Minutes of Exercise per Session: 20 min  Stress: Stress Concern Present (08/05/2023)   [employer] of Occupational Health - Occupational Stress Questionnaire    Feeling of Stress : To some extent  Social Connections: Moderately Integrated (05/08/2023)   Social Connection and Isolation Panel [NHANES]    Frequency of Communication with Friends and Family: More than three times a week    Frequency of Social Gatherings with Friends and Family: Twice a week    Attends Religious Services: More than 4 times per year    Active Member of [employer] or Organizations: Yes    Attends Banker Meetings: More than 4 times per year    Marital Status: Never married  Intimate Partner Violence: Not At Risk (10/11/2022)   Humiliation, Afraid, Rape, and Kick questionnaire    Fear of Current or Ex-Partner: No    Emotionally Abused: No    Physically Abused: No    Sexually Abused: No    Review of Systems:  All other review of systems negative except as mentioned in the HPI.  Physical Exam: Vital signs BP (!) 173/92   Pulse 85   Temp 97.7 F (36.5 C)   Resp 12   Ht 5' 2.5" (1.588 m)   Wt 170 lb (77.1 kg)   SpO2 100%   BMI 30.60 kg/m   General:   Alert,  Well-developed, well-nourished, pleasant and cooperative in NAD Airway:  Mallampati 3 Lungs:  Clear throughout to auscultation.   Heart:  Regular rate and rhythm; no murmurs, clicks, rubs,  or gallops. Abdomen:  Soft, nontender and nondistended. Normal bowel sounds.   Neuro/Psych:  Normal mood and affect. A and O x 3  Eugenia Hess, MD [facility]

## 2023-10-19 ENCOUNTER — Encounter: Payer: Self-pay | Admitting: Pediatrics

## 2023-10-19 ENCOUNTER — Ambulatory Visit (AMBULATORY_SURGERY_CENTER): Admitting: Pediatrics

## 2023-10-19 VITALS — BP 173/88 | HR 79 | Temp 97.7°F | Resp 13 | Ht 62.5 in | Wt 170.0 lb

## 2023-10-19 DIAGNOSIS — Z1211 Encounter for screening for malignant neoplasm of colon: Secondary | ICD-10-CM

## 2023-10-19 DIAGNOSIS — K5730 Diverticulosis of large intestine without perforation or abscess without bleeding: Secondary | ICD-10-CM

## 2023-10-19 DIAGNOSIS — D128 Benign neoplasm of rectum: Secondary | ICD-10-CM

## 2023-10-19 DIAGNOSIS — K648 Other hemorrhoids: Secondary | ICD-10-CM | POA: Diagnosis not present

## 2023-10-19 DIAGNOSIS — D122 Benign neoplasm of ascending colon: Secondary | ICD-10-CM | POA: Diagnosis not present

## 2023-10-19 DIAGNOSIS — D124 Benign neoplasm of descending colon: Secondary | ICD-10-CM | POA: Diagnosis not present

## 2023-10-19 MED ORDER — SODIUM CHLORIDE 0.9 % IV SOLN: 500.0000 mL | Freq: Once | INTRAVENOUS | Status: DC

## 2023-10-19 NOTE — Patient Instructions (Addendum)
-  Handout on polyps, diverticulosis and hemorrhoids provided. -await pathology results. -repeat colonoscopy for surveillance recommended. Date to be determined when pathology result become available.  -NO NSAIDs (ibuprofen ) for 10 days  YOU HAD AN ENDOSCOPIC PROCEDURE TODAY AT THE [facility]:   Refer to the procedure report that was given to you for any specific questions about what was found during the examination.  If the procedure report does not answer your questions, please call your gastroenterologist to clarify.  If you requested that your care partner not be given the details of your procedure findings, then the procedure report has been included in a sealed envelope for you to review at your convenience later.  YOU SHOULD EXPECT: Some feelings of bloating in the abdomen. Passage of more gas than usual.  Walking can help get rid of the air that was put into your GI tract during the procedure and reduce the bloating. If you had a lower endoscopy (such as a colonoscopy or flexible sigmoidoscopy) you may notice spotting of blood in your stool or on the toilet paper. If you underwent a bowel prep for your procedure, you may not have a normal bowel movement for a few days.  Please Note:  You might notice some irritation and congestion in your nose or some drainage.  This is from the oxygen used during your procedure.  There is no need for concern and it should clear up in a day or so.  SYMPTOMS TO REPORT IMMEDIATELY:  Following lower endoscopy (colonoscopy or flexible sigmoidoscopy):  Excessive amounts of blood in the stool  Significant tenderness or worsening of abdominal pains  Swelling of the abdomen that is new, acute  Fever of 100F or higher  For urgent or emergent issues, a gastroenterologist can be reached at any hour by calling [phone]. Do not use MyChart messaging for urgent concerns.    DIET:  We do recommend a small meal at first, but then you may proceed to  your regular diet.  Drink plenty of fluids but you should avoid alcoholic beverages for 24 hours.  ACTIVITY:  You should plan to take it easy for the rest of today and you should NOT DRIVE or use heavy machinery until tomorrow (because of the sedation medicines used during the test).    FOLLOW UP: Our staff will call the number listed on your records the next business day following your procedure.  We will call around 7:15- 8:00 am to check on you and address any questions or concerns that you may have regarding the information given to you following your procedure. If we do not reach you, we will leave a message.     If any biopsies were taken you will be contacted by phone or by letter within the next 1-3 weeks.  Please call us  at [phone] if you have not heard about the biopsies in 3 weeks.    SIGNATURES/CONFIDENTIALITY: You and/or your care partner have signed paperwork which will be entered into your electronic medical record.  These signatures attest to the fact that that the information above on your After Visit Summary has been reviewed and is understood.  Full responsibility of the confidentiality of this discharge information lies with you and/or your care-partner.

## 2023-10-19 NOTE — Progress Notes (Signed)
 To pacu, VSS. Report to [email or web address]

## 2023-10-19 NOTE — Progress Notes (Signed)
 Pt's states no medical or surgical changes since previsit or office visit.

## 2023-10-19 NOTE — Progress Notes (Signed)
 Called to room to assist during endoscopic procedure.  Patient ID and intended procedure confirmed with present staff. Received instructions for my participation in the procedure from the performing physician.

## 2023-10-19 NOTE — Op Note (Signed)
 [facility] Patient Name: Heidi Tran Procedure Date: 10/19/2023 12:07 PM MRN: [id number] Endoscopist: Eugenia Hess , MD, [id number] Age: 63 Referring MD:  Date of Birth: 05-07-1961 Gender: Female Account #: [id number] Procedure:                Colonoscopy Indications:              Positive Cologuard test, last colonoscopy in the                            remote past Procedure:                Pre-Anesthesia Assessment:                           - Prior to the procedure, a History and Physical                            was performed, and patient medications and                            allergies were reviewed. The patient's tolerance of                            previous anesthesia was also reviewed. The risks                            and benefits of the procedure and the sedation                            options and risks were discussed with the patient.                            All questions were answered, and informed consent                            was obtained. Prior Anticoagulants: The patient has                            taken no anticoagulant or antiplatelet agents. ASA                            Grade Assessment: II - A patient with mild systemic                            disease. After reviewing the risks and benefits,                            the patient was deemed in satisfactory condition to                            undergo the procedure.                           After obtaining informed consent, the colonoscope  was passed under direct vision. Throughout the                            procedure, the patient's blood pressure, pulse, and                            oxygen saturations were monitored continuously. The                            CF [id number] #[id number] was introduced through the anus                            and advanced to the cecum, identified by                            appendiceal orifice and  ileocecal valve. The                            colonoscopy was performed without difficulty. The                            patient tolerated the procedure well. The quality                            of the bowel preparation was adequate. The                            ileocecal valve, appendiceal orifice, and rectum                            were photographed. Scope In: 12:28:02 PM Scope Out: 1:04:01 PM Scope Withdrawal Time: 0 hours 29 minutes 5 seconds  Total Procedure Duration: 0 hours 35 minutes 59 seconds  Findings:                 The perianal and digital rectal examinations were                            normal. Pertinent negatives include normal                            sphincter tone and no palpable rectal lesions.                           Medium-mouthed and small-mouthed diverticula were                            found in the sigmoid colon, descending colon,                            transverse colon and ascending colon.                           Two sessile polyps were found in the descending  colon and ascending colon. The polyps were 3 to 4                            mm in size. These polyps were removed with a cold                            biopsy forceps. Resection and retrieval were                            complete.                           A 16 mm polyp was found in the rectum. The polyp                            was pedunculated. The polyp was removed with a hot                            snare. Resection and retrieval were complete. Area                            was successfully injected with 1 mL Spot (carbon                            black) for tattooing.                           Internal hemorrhoids were found during retroflexion. Complications:            No immediate complications. Estimated blood loss:                            Minimal. Estimated Blood Loss:     Estimated blood loss was minimal. Impression:                - Diverticulosis in the sigmoid colon, in the                            descending colon, in the transverse colon and in                            the ascending colon.                           - Two 3 to 4 mm polyps in the descending colon and                            in the ascending colon, removed with a cold biopsy                            forceps. Resected and retrieved.                           - One 16 mm polyp in the rectum, removed with  a hot                            snare. Resected and retrieved. Injected.                           - Internal hemorrhoids. Recommendation:           - Discharge patient to home (ambulatory).                           - Await pathology results.                           - Repeat colonoscopy for surveillance based on                            pathology results.                           - No NSAIDs or aspirin x 10 days status post                            polypectomy                           - The findings and recommendations were discussed                            with the patient's family.                           - Return to referring physician.                           - Patient has a contact number available for                            emergencies. The signs and symptoms of potential                            delayed complications were discussed with the                            patient. Return to normal activities tomorrow.                            Written discharge instructions were provided to the                            patient. Eugenia Hess, MD 10/19/2023 1:16:36 PM This report has been signed electronically.

## 2023-10-23 ENCOUNTER — Telehealth: Payer: Self-pay

## 2023-10-23 NOTE — Telephone Encounter (Signed)
  Follow up Call-     10/19/2023   11:14 AM  Call back number  Post procedure Call Back phone  # [phone]  Permission to leave phone message Yes     Patient questions:  Do you have a fever, pain , or abdominal swelling? No. Pain Score  0 *  Have you tolerated food without any problems? Yes.    Have you been able to return to your normal activities? Yes.    Do you have any questions about your discharge instructions: Diet   No. Medications  No. Follow up visit  No.  Do you have questions or concerns about your Care? No.  Actions: * If pain score is 4 or above: No action needed, pain <4.

## 2023-10-24 ENCOUNTER — Ambulatory Visit: Payer: Self-pay | Admitting: Pediatrics

## 2023-10-24 LAB — SURGICAL PATHOLOGY

## 2023-11-29 ENCOUNTER — Other Ambulatory Visit (HOSPITAL_COMMUNITY): Payer: Self-pay

## 2023-11-29 ENCOUNTER — Other Ambulatory Visit: Payer: Self-pay | Admitting: Nurse Practitioner

## 2023-11-29 DIAGNOSIS — J4540 Moderate persistent asthma, uncomplicated: Secondary | ICD-10-CM

## 2023-11-29 MED ORDER — FLUTICASONE-SALMETEROL 250-50 MCG/ACT IN AEPB
1.0000 | INHALATION_SPRAY | Freq: Two times a day (BID) | RESPIRATORY_TRACT | 0 refills | Status: DC
  Filled 2023-11-29: qty 60, 30d supply, fill #0

## 2023-12-04 ENCOUNTER — Other Ambulatory Visit: Payer: Self-pay

## 2023-12-28 ENCOUNTER — Other Ambulatory Visit (HOSPITAL_COMMUNITY): Payer: Self-pay

## 2023-12-28 ENCOUNTER — Other Ambulatory Visit: Payer: Self-pay | Admitting: Nurse Practitioner

## 2023-12-28 DIAGNOSIS — J4540 Moderate persistent asthma, uncomplicated: Secondary | ICD-10-CM

## 2023-12-28 DIAGNOSIS — I10 Essential (primary) hypertension: Secondary | ICD-10-CM

## 2023-12-28 MED ORDER — TRIAMTERENE-HCTZ 37.5-25 MG PO TABS
1.0000 | ORAL_TABLET | Freq: Every day | ORAL | 1 refills | Status: DC
  Filled 2023-12-28: qty 90, 90d supply, fill #0

## 2023-12-28 MED ORDER — FLUTICASONE-SALMETEROL 250-50 MCG/ACT IN AEPB
1.0000 | INHALATION_SPRAY | Freq: Two times a day (BID) | RESPIRATORY_TRACT | 0 refills | Status: DC
  Filled 2023-12-28: qty 60, 30d supply, fill #0

## 2023-12-31 ENCOUNTER — Other Ambulatory Visit: Payer: Self-pay

## 2024-02-11 ENCOUNTER — Other Ambulatory Visit (HOSPITAL_COMMUNITY): Payer: Self-pay

## 2024-02-11 ENCOUNTER — Ambulatory Visit (INDEPENDENT_AMBULATORY_CARE_PROVIDER_SITE_OTHER): Payer: Self-pay | Admitting: Nurse Practitioner

## 2024-02-11 ENCOUNTER — Encounter: Payer: Self-pay | Admitting: Nurse Practitioner

## 2024-02-11 ENCOUNTER — Other Ambulatory Visit: Payer: Self-pay

## 2024-02-11 VITALS — BP 153/85 | HR 75 | Temp 98.5°F | Wt 172.8 lb

## 2024-02-11 DIAGNOSIS — J4540 Moderate persistent asthma, uncomplicated: Secondary | ICD-10-CM

## 2024-02-11 DIAGNOSIS — M79672 Pain in left foot: Secondary | ICD-10-CM | POA: Diagnosis not present

## 2024-02-11 DIAGNOSIS — I10 Essential (primary) hypertension: Secondary | ICD-10-CM

## 2024-02-11 DIAGNOSIS — M79671 Pain in right foot: Secondary | ICD-10-CM | POA: Diagnosis not present

## 2024-02-11 MED ORDER — REPATHA SURECLICK 140 MG/ML ~~LOC~~ SOAJ
140.0000 mg | SUBCUTANEOUS | 1 refills | Status: AC
  Filled 2024-02-11 (×2): qty 6, 84d supply, fill #0

## 2024-02-11 MED ORDER — TRIAMTERENE-HCTZ 37.5-25 MG PO TABS
1.0000 | ORAL_TABLET | Freq: Every day | ORAL | 1 refills | Status: AC
  Filled 2024-02-11 – 2024-04-29 (×2): qty 90, 90d supply, fill #0

## 2024-02-11 MED ORDER — FLUTICASONE-SALMETEROL 250-50 MCG/ACT IN AEPB
1.0000 | INHALATION_SPRAY | Freq: Two times a day (BID) | RESPIRATORY_TRACT | 0 refills | Status: DC
  Filled 2024-02-11 (×2): qty 60, 30d supply, fill #0

## 2024-02-11 MED ORDER — MONTELUKAST SODIUM 10 MG PO TABS
10.0000 mg | ORAL_TABLET | Freq: Every day | ORAL | 3 refills | Status: AC
  Filled 2024-02-11 (×2): qty 30, 30d supply, fill #0

## 2024-02-11 NOTE — Progress Notes (Signed)
 Subjective   Patient ID: Heidi Tran, female    DOB: June 22, 1960, 63 y.o.   MRN: [id number]  Chief Complaint  Patient presents with   Medical Management of Chronic Issues   Foot Pain    Patient stated that every night when she get in the bed the bottom of her feet are numb    Referring provider: Oley Bascom RAMAN, NP  Heidi Tran is a 63 y.o. female with Past Medical History: No date: Allergic rhinitis, cause unspecified 30 years ago: Allergy  No date: Asthma 12/2019: Hypercholesteremia No date: Hypertension 04/2019: Shoulder strain, left, initial encounter No date: Stroke [facility]) No date: Unspecified essential hypertension No date: Vitamin D  deficiency  Foot Pain    Hypertension:   Patient presents today for hypertension. She is compliant with taking Maxide. Denies f/c/s, n/v/d, hemoptysis, PND, leg swelling Denies chest pain or edema   Recently had lumpectomy and recently completed radiation treatments. Follows with oncology.  Stopped taking tamoxifen . Has notified oncology.  She states it was causing nausea. Reports that she has completed everything and is now cancer free.      A1c is 6.5 at last visit in office.  This is the same as last time.  We will continue to monitor.  Discussed diabetic diet in office today.   Denies f/c/s, hemoptysis, PND, leg swelling Denies chest pain or edema    Allergies  Allergen Reactions   Shellfish Allergy  Swelling    Throat, lips and tongue swelling   Statins Other (See Comments)    Leg cramps   Tamoxifen  Nausea Only    Burning of skin, esophagus and stomach   Crestor  [Rosuvastatin ] Other (See Comments)    Joint pain   Sulfonamide Derivatives Rash    REACTION: rash    Immunization History  Administered Date(s) Administered   Influenza Split 02/17/2011, 03/12/2012, 01/27/2013, 02/26/2014   Influenza Whole 02/26/2009, 02/26/2010   Influenza, Seasonal, Injecte, Preservative Fre 05/11/2023   Influenza,inj,Quad PF,6+  Mos 07/17/2017, 02/18/2018, 01/31/2019, 03/18/2020, 03/24/2021, 05/04/2022   PFIZER(Purple Top)SARS-COV-2 Vaccination 08/19/2019, 09/16/2019, 04/26/2020   Pneumococcal Polysaccharide-23 05/29/2006    Tobacco History: Social History   Tobacco Use  Smoking Status Never  Smokeless Tobacco Never   Counseling given: Not Answered   Outpatient Encounter Medications as of 02/11/2024  Medication Sig   albuterol  (PROVENTIL ) (2.5 MG/3ML) 0.083% nebulizer solution TAKE 3 MLS (2.5 MG TOTAL) BY NEBULIZATION EVERY 6 (SIX) HOURS AS NEEDED.   albuterol  (VENTOLIN  HFA) 108 (90 Base) MCG/ACT inhaler Inhale 2 puffs into the lungs every 6 (six) hours as needed for wheezing or shortness of breath.   diclofenac  Sodium (VOLTAREN ) 1 % GEL Apply 2 g topically 4 (four) times daily.   hydrocortisone  cream 1 % Apply 1 application topically 2 (two) times daily.   Multiple Vitamin (MULTIVITAMIN) capsule Take 1 capsule by mouth daily.   naproxen  (NAPROSYN ) 500 MG tablet Take 1 tablet (500 mg total) by mouth 2 (two) times daily with a meal.   potassium chloride  SA (KLOR-CON  M) 20 MEQ tablet Take 1 tablet (20 mEq total) by mouth daily.   Turmeric (QC TUMERIC COMPLEX PO) Take by mouth.   [DISCONTINUED] Evolocumab  (REPATHA  SURECLICK) 140 MG/ML SOAJ Inject 140 mg into the skin every 14 (fourteen) days.   [DISCONTINUED] fluticasone -salmeterol (ADVAIR  DISKUS) 250-50 MCG/ACT AEPB Inhale 1 puff into the lungs in the morning and at bedtime.   [DISCONTINUED] montelukast  (SINGULAIR ) 10 MG tablet Take 1 tablet (10 mg) by mouth at bedtime.   [DISCONTINUED] triamterene -hydrochlorothiazide  (MAXZIDE -25)  37.5-25 MG tablet Take 1 tablet by mouth daily.   budesonide -formoterol  (SYMBICORT ) 160-4.5 MCG/ACT inhaler Inhale 2 puffs into the lungs 2 (two) times daily. (Patient not taking: Reported on 10/19/2023)   Evolocumab  (REPATHA  SURECLICK) 140 MG/ML SOAJ Inject 140 mg into the skin every 14 (fourteen) days.   fluticasone -salmeterol (ADVAIR   DISKUS) 250-50 MCG/ACT AEPB Inhale 1 puff into the lungs in the morning and at bedtime.   montelukast  (SINGULAIR ) 10 MG tablet Take 1 tablet (10 mg) by mouth at bedtime.   ondansetron  (ZOFRAN -ODT) 4 MG disintegrating tablet Take 1 tablet (4 mg total) by mouth every 8 (eight) hours as needed. (Patient not taking: Reported on 10/19/2023)   triamterene -hydrochlorothiazide  (MAXZIDE -25) 37.5-25 MG tablet Take 1 tablet by mouth daily.   No facility-administered encounter medications on file as of 02/11/2024.    Review of Systems  Review of Systems  Constitutional: Negative.   HENT: Negative.    Cardiovascular: Negative.   Gastrointestinal: Negative.   Allergic/Immunologic: Negative.   Neurological: Negative.   Psychiatric/Behavioral: Negative.       Objective:   BP (!) 170/94   Pulse 88   Temp 98.5 F (36.9 C) (Oral)   Wt 172 lb 12.8 oz (78.4 kg)   SpO2 97%   BMI 31.10 kg/m   Wt Readings from Last 5 Encounters:  02/11/24 172 lb 12.8 oz (78.4 kg)  10/19/23 170 lb (77.1 kg)  09/28/23 170 lb (77.1 kg)  09/04/23 180 lb 3.2 oz (81.7 kg)  08/09/23 179 lb 3.2 oz (81.3 kg)     Physical Exam Vitals and nursing note reviewed.  Constitutional:      General: She is not in acute distress.    Appearance: She is well-developed.  Cardiovascular:     Rate and Rhythm: Normal rate and regular rhythm.  Pulmonary:     Effort: Pulmonary effort is normal.     Breath sounds: Normal breath sounds.  Neurological:     Mental Status: She is alert and oriented to person, place, and time.       Assessment & Plan:   Bilateral foot pain -     Ambulatory referral to Podiatry  Moderate persistent asthma, unspecified whether complicated -     Fluticasone -Salmeterol; Inhale 1 puff into the lungs in the morning and at bedtime.  Dispense: 60 each; Refill: 0  Essential hypertension -     Triamterene -HCTZ; Take 1 tablet by mouth daily.  Dispense: 90 tablet; Refill: 1  Other orders -      Montelukast  Sodium; Take 1 tablet (10 mg) by mouth at bedtime.  Dispense: 30 tablet; Refill: 3 -     Repatha  SureClick; Inject 140 mg into the skin every 14 (fourteen) days.  Dispense: 6 mL; Refill: 1     Return in about 6 months (around 08/10/2024).   Bascom GORMAN Borer, NP 02/11/2024

## 2024-03-03 ENCOUNTER — Ambulatory Visit (INDEPENDENT_AMBULATORY_CARE_PROVIDER_SITE_OTHER)

## 2024-03-03 ENCOUNTER — Encounter: Payer: Self-pay | Admitting: Podiatry

## 2024-03-03 ENCOUNTER — Ambulatory Visit: Admitting: Podiatry

## 2024-03-03 DIAGNOSIS — M25571 Pain in right ankle and joints of right foot: Secondary | ICD-10-CM | POA: Diagnosis not present

## 2024-03-03 DIAGNOSIS — M25572 Pain in left ankle and joints of left foot: Secondary | ICD-10-CM

## 2024-03-03 DIAGNOSIS — G5762 Lesion of plantar nerve, left lower limb: Secondary | ICD-10-CM

## 2024-03-03 DIAGNOSIS — M7752 Other enthesopathy of left foot: Secondary | ICD-10-CM

## 2024-03-03 DIAGNOSIS — M2012 Hallux valgus (acquired), left foot: Secondary | ICD-10-CM

## 2024-03-03 DIAGNOSIS — M2011 Hallux valgus (acquired), right foot: Secondary | ICD-10-CM

## 2024-03-03 DIAGNOSIS — M7751 Other enthesopathy of right foot: Secondary | ICD-10-CM | POA: Diagnosis not present

## 2024-03-03 MED ORDER — TRIAMCINOLONE ACETONIDE 10 MG/ML IJ SUSP
10.0000 mg | Freq: Once | INTRAMUSCULAR | Status: AC
  Administered 2024-03-03: 10 mg

## 2024-03-03 NOTE — Progress Notes (Signed)
 patient presents today with complaint of pain on the dorsal and plantar aspect of the foot pain on the bottom of the foot with walking and at night it burns and feels swollen.  Complains of pain over the dorsal aspect of the foot.  Some pain around first metatarsal phalangeal joint at times.  Usually wears a good supportive shoe.   Physical exam:  General appearance: Pleasant, and in no acute distress. AOx3.  Vascular: Pedal pulses: DP 2/4 bilaterally, PT 2/4 bilaterally.  Mild edema lower legs bilaterally. Capillary fill time immediate b/l.SABRA  Neurological: Light touch intact feet bilaterally.  Normal Achilles reflex bilaterally.  No clonus or spasticity noted.  Negative Tinel sign tarsal tunnel and porta pedis and  cutaneous nerves feet and ankles.  Positive Mulder sign third IMS bilaterally and second IMS left  Dermatologic:   Skin normal temperature bilaterally.  Skin normal color, tone, and texture bilaterally.   Musculoskeletal: Tenderness in the 2nd and 3rd IMS left and third IMS right.  Tenderness at the sinus tarsi and with range of motion subtalar joint.  Some noticed on the dorsal and medial aspect of the first metatarsal phalangeal joint bilaterally.  Hallux abductovalgus deformity bilaterally.  Radiographs: 3 views feet bilaterally: Hallux abductovalgus deformities.  No evidence of  any fractures or dislocations.  Normal bone density.  No evidence of any bone tumors.    Diagnosis: 1.  Arthralgia subtalar joint bilaterally 2.  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 3.  Hallux abductovalgus deformity bilaterally  Plan: -New patient office visit for evaluation and management level 3.  Modifier 25. -Discussed neuromas and etiology and treatment.  Also has some the pain she is getting at the subtalar joint.  This could be from compensation for the neuroma pain.  Discussed proper shoes to wear.  Avoid going barefoot or wearing hard flat soled contrition  shoes -injected 3cc 2:1 mixture 0.5 cc Marcaine :Kenolog 10mg /31ml at neuroma 2nd and 3rd plantar common digital nerve left.  Patient IS received weeks care just above the prescription for diabetic shoes and still's you can take that to the former and flush them down the toilet or something   Return 2 weeks follow-up injection neuroma left foot

## 2024-03-04 ENCOUNTER — Ambulatory Visit: Payer: Self-pay

## 2024-03-08 ENCOUNTER — Other Ambulatory Visit: Payer: Self-pay | Admitting: Nurse Practitioner

## 2024-03-08 DIAGNOSIS — J4540 Moderate persistent asthma, uncomplicated: Secondary | ICD-10-CM

## 2024-03-10 ENCOUNTER — Other Ambulatory Visit (HOSPITAL_COMMUNITY): Payer: Self-pay

## 2024-03-10 ENCOUNTER — Other Ambulatory Visit: Payer: Self-pay

## 2024-03-10 MED ORDER — FLUTICASONE-SALMETEROL 250-50 MCG/ACT IN AEPB
1.0000 | INHALATION_SPRAY | Freq: Two times a day (BID) | RESPIRATORY_TRACT | 0 refills | Status: DC
  Filled 2024-03-10: qty 60, 30d supply, fill #0

## 2024-03-18 ENCOUNTER — Ambulatory Visit: Admitting: Podiatry

## 2024-04-08 ENCOUNTER — Ambulatory Visit: Admitting: Podiatry

## 2024-04-08 ENCOUNTER — Encounter: Payer: Self-pay | Admitting: Podiatry

## 2024-04-08 DIAGNOSIS — G5781 Other specified mononeuropathies of right lower limb: Secondary | ICD-10-CM | POA: Diagnosis not present

## 2024-04-08 MED ORDER — TRIAMCINOLONE ACETONIDE 40 MG/ML IJ SUSP
40.0000 mg | Freq: Once | INTRAMUSCULAR | Status: AC
  Administered 2024-04-08: 40 mg

## 2024-04-08 NOTE — Progress Notes (Signed)
 Patient's follow-up injection neuroma left foot.  At the 2nd and 3rd IMS.  Doing better on the left probably about 80 to 90% better still distal a bit of soreness right is still very painful.   Physical exam:  General appearance: Pleasant, and in no acute distress. AOx3.  Vascular: Pedal pulses: DP 2/4 bilaterally, PT 2/4 bilaterally.  Mild edema lower legs bilaterally. Capillary fill time immediate bilaterally.  Neurological: Positive Mulder sign third IMS right  Dermatologic:   Skin normal temperature bilaterally.  Skin normal color, tone, and texture bilaterally.   Musculoskeletal: Tenderness third IMS right foot.  Some soreness at the IMS 2 and 3 on the left.   Diagnosis: 1.  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  Will do an injection of the third IMS right today. -injected 3cc 2:1 mixture 0.5 cc Marcaine :Kenolog 10mg /57ml at neuroma third plantar common digital nerve right.    Return 2 weeks follow-up injection neuroma third IMS right

## 2024-04-22 ENCOUNTER — Ambulatory Visit: Admitting: Podiatry

## 2024-04-29 ENCOUNTER — Other Ambulatory Visit (HOSPITAL_COMMUNITY): Payer: Self-pay

## 2024-04-29 ENCOUNTER — Other Ambulatory Visit: Payer: Self-pay

## 2024-04-29 ENCOUNTER — Other Ambulatory Visit: Payer: Self-pay | Admitting: Nurse Practitioner

## 2024-04-29 DIAGNOSIS — J4540 Moderate persistent asthma, uncomplicated: Secondary | ICD-10-CM

## 2024-04-30 ENCOUNTER — Other Ambulatory Visit (HOSPITAL_COMMUNITY): Payer: Self-pay

## 2024-04-30 ENCOUNTER — Other Ambulatory Visit: Payer: Self-pay

## 2024-04-30 MED ORDER — FLUTICASONE-SALMETEROL 250-50 MCG/ACT IN AEPB
1.0000 | INHALATION_SPRAY | Freq: Two times a day (BID) | RESPIRATORY_TRACT | 0 refills | Status: DC
  Filled 2024-04-30: qty 60, 30d supply, fill #0

## 2024-04-30 MED ORDER — POTASSIUM CHLORIDE CRYS ER 20 MEQ PO TBCR
20.0000 meq | EXTENDED_RELEASE_TABLET | Freq: Every day | ORAL | 0 refills | Status: AC
  Filled 2024-04-30: qty 90, 90d supply, fill #0

## 2024-05-13 ENCOUNTER — Ambulatory Visit: Payer: Self-pay

## 2024-05-13 VITALS — BP 139/72 | Ht 62.5 in | Wt 170.0 lb

## 2024-05-13 DIAGNOSIS — Z0000 Encounter for general adult medical examination without abnormal findings: Secondary | ICD-10-CM

## 2024-05-13 NOTE — Progress Notes (Signed)
 I connected with  Heidi Tran on 05/13/2024 by a audio enabled telemedicine application and verified that I am speaking with the correct person using two identifiers.  Patient Location: Home  Provider Location: Home Office  Persons Participating in Visit: Patient.  I discussed the limitations of evaluation and management by telemedicine. The patient expressed understanding and agreed to proceed.  Vital Signs: Because this visit was a virtual/telehealth visit, some criteria may be missing or patient reported. Any vitals not documented were not able to be obtained and vitals that have been documented are patient reported.   This visit was performed by a medical professional under my direct supervision. I was immediately available for consultation/collaboration. I have reviewed and agree with the Annual Wellness Visit documentation.  Chief Complaint  Patient presents with   Medicare Wellness     Subjective:   Heidi Tran is a 63 y.o. female who presents for a Medicare Annual Wellness Visit.  Visit info / Clinical Intake: Medicare Wellness Visit Type:: Initial Annual Wellness Visit Persons participating in visit and providing information:: patient Medicare Wellness Visit Mode:: Telephone If telephone:: video declined Since this visit was completed virtually, some vitals may be partially provided or unavailable. Missing vitals are due to the limitations of the virtual format.: Documented vitals are patient reported If Telephone or Video please confirm:: I connected with patient using audio/video enable telemedicine. I verified patient identity with two identifiers, discussed telehealth limitations, and patient agreed to proceed. Patient Location:: home Provider Location:: home office Interpreter Needed?: No Pre-visit prep was completed: yes AWV questionnaire completed by patient prior to visit?: yes Date:: 05/09/24 Living arrangements:: (!) lives alone; with  family/others Patient's Overall Health Status Rating: (Patient-Rptd) very good Typical amount of pain: (Patient-Rptd) some Does pain affect daily life?: (!) (Patient-Rptd) yes Are you currently prescribed opioids?: no  Dietary Habits and Nutritional Risks How many meals a day?: (Patient-Rptd) 3 Eats fruit and vegetables daily?: (Patient-Rptd) yes Most meals are obtained by: (Patient-Rptd) preparing own meals In the last 2 weeks, have you had any of the following?: none Diabetic:: no  Functional Status Activities of Daily Living (to include ambulation/medication): (Patient-Rptd) Independent Ambulation: (Patient-Rptd) Independent Medication Administration: (Patient-Rptd) Independent Home Management (perform basic housework or laundry): (Patient-Rptd) Independent Manage your own finances?: (Patient-Rptd) yes Primary transportation is: (Patient-Rptd) driving Concerns about vision?: no *vision screening is required for WTM* Concerns about hearing?: no  Fall Screening Falls in the past year?: (Patient-Rptd) 0 Number of falls in past year: 0 Was there an injury with Fall?: 0 Fall Risk Category Calculator: 0 Patient Fall Risk Level: Low Fall Risk  Fall Risk Patient at Risk for Falls Due to: No Fall Risks Fall risk Follow up: Falls evaluation completed; Falls prevention discussed  Home and Transportation Safety: All rugs have non-skid backing?: (Patient-Rptd) yes All stairs or steps have railings?: (Patient-Rptd) yes Grab bars in the bathtub or shower?: (!) (Patient-Rptd) no Have non-skid surface in bathtub or shower?: (Patient-Rptd) yes Good home lighting?: (Patient-Rptd) yes Regular seat belt use?: (Patient-Rptd) yes Hospital stays in the last year:: (Patient-Rptd) no  Cognitive Assessment Difficulty concentrating, remembering, or making decisions? : (Patient-Rptd) no Will 6CIT or Mini Cog be Completed: yes What year is it?: 0 points What month is it?: 0 points Give patient  an address phrase to remember (5 components): remember words apple , table, penny About what time is it?: 0 points Count backwards from 20 to 1: 0 points Say the months of the year in reverse: 0  points Repeat the address phrase from earlier: 0 points 6 CIT Score: 0 points  Advance Directives (For Healthcare) Does Patient Have a Medical Advance Directive?: No Would patient like information on creating a medical advance directive?: No - Guardian declined  Reviewed/Updated  Reviewed/Updated: Reviewed All (Medical, Surgical, Family, Medications, Allergies, Care Teams, Patient Goals)    Allergies (verified) Shellfish allergy , Statins, Tamoxifen , Crestor  [rosuvastatin ], and Sulfonamide derivatives   Current Medications (verified) Outpatient Encounter Medications as of 05/13/2024  Medication Sig   albuterol  (PROVENTIL ) (2.5 MG/3ML) 0.083% nebulizer solution TAKE 3 MLS (2.5 MG TOTAL) BY NEBULIZATION EVERY 6 (SIX) HOURS AS NEEDED.   albuterol  (VENTOLIN  HFA) 108 (90 Base) MCG/ACT inhaler Inhale 2 puffs into the lungs every 6 (six) hours as needed for wheezing or shortness of breath.   budesonide -formoterol  (SYMBICORT ) 160-4.5 MCG/ACT inhaler Inhale 2 puffs into the lungs 2 (two) times daily.   diclofenac  Sodium (VOLTAREN ) 1 % GEL Apply 2 g topically 4 (four) times daily.   Evolocumab  (REPATHA  SURECLICK) 140 MG/ML SOAJ Inject 140 mg into the skin every 14 (fourteen) days.   fluticasone -salmeterol (ADVAIR  DISKUS) 250-50 MCG/ACT AEPB Inhale 1 puff into the lungs in the morning and at bedtime.   hydrocortisone  cream 1 % Apply 1 application topically 2 (two) times daily.   montelukast  (SINGULAIR ) 10 MG tablet Take 1 tablet (10 mg) by mouth at bedtime.   Multiple Vitamin (MULTIVITAMIN) capsule Take 1 capsule by mouth daily.   naproxen  (NAPROSYN ) 500 MG tablet Take 1 tablet (500 mg total) by mouth 2 (two) times daily with a meal.   potassium chloride  SA (KLOR-CON  M) 20 MEQ tablet Take 1 tablet (20 mEq  total) by mouth daily.   triamterene -hydrochlorothiazide  (MAXZIDE -25) 37.5-25 MG tablet Take 1 tablet by mouth daily.   Turmeric (QC TUMERIC COMPLEX PO) Take by mouth.   No facility-administered encounter medications on file as of 05/13/2024.    History: Past Medical History:  Diagnosis Date   Allergic rhinitis, cause unspecified    Allergy  30 years ago   Asthma    Hypercholesteremia 12/2019   Hypertension    Shoulder strain, left, initial encounter 04/2019   Stroke [facility])    Unspecified essential hypertension    Vitamin D  deficiency    Past Surgical History:  Procedure Laterality Date   ABDOMINAL HYSTERECTOMY     BREAST BIOPSY Left 07/18/2022   US  LT BREAST BX W LOC DEV 1ST LESION IMG BX SPEC US  GUIDE 07/18/2022 [facility]   BREAST BIOPSY Left 07/18/2022   US  LT BREAST BX W LOC DEV EA ADD LESION IMG BX SPEC US  GUIDE 07/18/2022 [facility]   BREAST BIOPSY  08/15/2022   MM LT RADIOACTIVE SEED EA ADD LESION LOC MAMMO GUIDE 08/15/2022 [facility]   BREAST BIOPSY  08/15/2022   MM LT RADIOACTIVE SEED LOC MAMMO GUIDE 08/15/2022 [facility]   BREAST LUMPECTOMY WITH RADIOACTIVE SEED LOCALIZATION Left 08/18/2022   Procedure: LEFT BREAST BRACKETED LUMPECTOMY WITH RADIOACTIVE SEED LOCALIZATION;  Surgeon: Vanderbilt Ned, MD;  Location: [facility];  Service: General;  Laterality: Left;   CESAREAN SECTION     RE-EXCISION OF BREAST LUMPECTOMY Left 09/21/2022   Procedure: RE-EXCISION OF LEFT BREAST LUMPECTOMY;  Surgeon: Vanderbilt Ned, MD;  Location: [facility];  Service: General;  Laterality: Left;   SENTINEL NODE BIOPSY Left 09/21/2022   Procedure: LEFTSENTINEL LYMPH NODE MAPPING;  Surgeon: Vanderbilt Ned, MD;  Location: [facility];  Service: General;  Laterality: Left;   TOTAL  ABDOMINAL HYSTERECTOMY W/ BILATERAL SALPINGOOPHORECTOMY     Family History  Problem Relation Age of Onset   Breast cancer Mother 52       d.  25   Cancer Mother    Prostate cancer Father        dx 27s   Heart disease Father    Diabetes Father    Cancer Father    Hypertension Father    Bone cancer Paternal Aunt 68       or other primary?   Cancer Paternal Aunt        appendix; early stage; dx after 40   Cancer Paternal Aunt        unknown type; dx 30s; ? early stage/minimal treatment   Diabetes Brother    Colon cancer Neg Hx    Rectal cancer Neg Hx    Stomach cancer Neg Hx    Social History   Occupational History   Occupation: [employer] Services Operational Officer  Tobacco Use   Smoking status: Never   Smokeless tobacco: Never  Vaping Use   Vaping status: Never Used  Substance and Sexual Activity   Alcohol use: Never   Drug use: Never   Sexual activity: Not Currently   Tobacco Counseling Counseling given: Not Answered  SDOH Screenings   Food Insecurity: Food Insecurity Present (05/09/2024)  Housing: Low Risk (05/09/2024)  Transportation Needs: No Transportation Needs (05/09/2024)  Utilities: Not At Risk (05/13/2024)  Depression (PHQ2-9): Low Risk (05/13/2024)  Financial Resource Strain: Low Risk (05/09/2024)  Physical Activity: Sufficiently Active (05/09/2024)  Social Connections: Moderately Integrated (05/09/2024)  Stress: Stress Concern Present (05/09/2024)  Tobacco Use: Low Risk (05/13/2024)  Health Literacy: Adequate Health Literacy (05/13/2024)   See flowsheets for full screening details  Depression Screen PHQ 2 & 9 Depression Scale- Over the past 2 weeks, how often have you been bothered by any of the following problems? Little interest or pleasure in doing things: 0 Feeling down, depressed, or hopeless (PHQ Adolescent also includes...irritable): 0 PHQ-2 Total Score: 0 Trouble falling or staying asleep, or sleeping too much: 1 Feeling tired or having little energy: 0 Poor appetite or overeating (PHQ Adolescent also includes...weight loss): 0 Feeling bad about yourself - or that you are a failure or have let yourself or  your family down: 0 Trouble concentrating on things, such as reading the newspaper or watching television (PHQ Adolescent also includes...like school work): 0 Moving or speaking so slowly that other people could have noticed. Or the opposite - being so fidgety or restless that you have been moving around a lot more than usual: 0 Thoughts that you would be better off dead, or of hurting yourself in some way: 0 PHQ-9 Total Score: 1 If you checked off any problems, how difficult have these problems made it for you to do your work, take care of things at home, or get along with other people?: Not difficult at all  Depression Treatment Depression Interventions/Treatment : EYV7-0 Score <4 Follow-up Not Indicated     Goals Addressed               This Visit's Progress     Patient Stated (pt-stated)        Patient would like to have better health and energy             Objective:    Today's Vitals   05/13/24 1311  BP: 139/72  Weight: 170 lb (77.1 kg)  Height: 5' 2.5 (1.588 m)   Body mass index is 30.6  kg/m.  Hearing/Vision screen Hearing Screening - Comments:: No difficulties  Vision Screening - Comments:: Wears glasses. Dr. Abigail on [address] [place] Immunizations and Health Maintenance Health Maintenance  Topic Date Due   Medicare Annual Wellness (AWV)  Never done   HIV Screening  Never done   Hepatitis C Screening  Never done   Zoster Vaccines- Shingrix (1 of 2) Never done   Bone Density Scan  Never done   COVID-19 Vaccine (4 - 2025-26 season) 01/28/2024   Influenza Vaccine  08/26/2024 (Originally 12/28/2023)   DTaP/Tdap/Td (1 - Tdap) 02/10/2025 (Originally 11/25/1979)   Pneumococcal Vaccine: 50+ Years (2 of 2 - PCV) 02/10/2025 (Originally 05/30/2007)   Mammogram  07/15/2024   Colonoscopy  10/19/2026   Hepatitis B Vaccines 19-59 Average Risk  Aged Out   Meningococcal B Vaccine  Aged Out   Fecal DNA (Cologuard)  Discontinued        Assessment/Plan:  This  is a routine wellness examination for Heidi Tran.  Patient Care Team: Oley Bascom RAMAN, NP as PCP - General (Pulmonary Disease) Vanderbilt Ned, MD as Consulting Physician (General Surgery) Lanny Callander, MD as Consulting Physician (Hematology) Shannon Agent, MD as Consulting Physician (Radiation Oncology) Tyree Nanetta SAILOR, RN as Oncology Nurse Navigator Rudy Dorothyann DASEN, RPH-CPP (Pharmacist) Burton, Lacie K, NP as Nurse Practitioner (Nurse Practitioner)  I have personally reviewed and noted the following in the patients chart:   Medical and social history Use of alcohol, tobacco or illicit drugs  Current medications and supplements including opioid prescriptions. Functional ability and status Nutritional status Physical activity Advanced directives List of other physicians Hospitalizations, surgeries, and ER visits in previous 12 months Vitals Screenings to include cognitive, depression, and falls Referrals and appointments  No orders of the defined types were placed in this encounter.  In addition, I have reviewed and discussed with patient certain preventive protocols, quality metrics, and best practice recommendations. A written personalized care plan for preventive services as well as general preventive health recommendations were provided to patient.   Lyle MARLA Right, [place]   05/13/2024   No follow-ups on file.  After Visit Summary: (MyChart) Due to this being a telephonic visit, the after visit summary with patients personalized plan was offered to patient via MyChart   Health Maintenance was addressed patient declined at this time   No voiced or noted concerns at this time

## 2024-05-13 NOTE — Telephone Encounter (Signed)
 Pt concerned she was exposed to whooping cough about a week ago, was sick prior to exposure, and is improving.   Patient was last seen in primary care on 02/11/2024 by Oley Bascom RAMAN, NP.  Called Nurse Triage reporting Cough.  Symptoms began about a month ago.  Interventions attempted: OTC medications: mucinex.  Symptoms are: gradually improving.  Triage Disposition: Home Care  Patient/caregiver understands and will follow disposition?: Yes, will follow disposition  Copied from CRM #[id number]. Topic: Clinical - Red Word Triage >> May 13, 2024  2:18 PM Amber H wrote: [facility] that prompted transfer to Nurse Triage: Patient called and stated she has a niece that currently have whooping cough. She is congested, green mucous, coughing on and off - does have known asthma. Wants to know if she needs an abx. Reason for Disposition  Cough  Answer Assessment - Initial Assessment Questions 1. ONSET: When did the cough begin?      About a month 3. SPUTUM: Describe the color of your sputum (e.g., none, dry cough; clear, white, yellow, green)     Was clear then green now clear again 4. HEMOPTYSIS: Are you coughing up any blood? If Yes, ask: How much? (e.g., flecks, streaks, tablespoons, etc.)     denies 5. DIFFICULTY BREATHING: Are you having difficulty breathing? If Yes, ask: How bad is it? (e.g., mild, moderate, severe)      denies 6. FEVER: Do you have a fever? If Yes, ask: What is your temperature, how was it measured, and when did it start?     denies 8. LUNG HISTORY: Do you have any history of lung disease?  (e.g., pulmonary embolus, asthma, emphysema)     asthma 10. OTHER SYMPTOMS: Do you have any other symptoms? (e.g., runny nose, wheezing, chest pain)       denies 12. TRAVEL: Have you traveled out of the country in the last month? (e.g., travel history, exposures)       Denies  Pt states that she was exposed to whooping cough recently, states her cough started  before the exposure.  Pt states her illness started about a month ago and is improving.  Protocols used: Cough - Acute Productive-A-AH

## 2024-05-13 NOTE — Patient Instructions (Signed)
 Heidi Tran,  Thank you for taking the time for your Medicare Wellness Visit. I appreciate your continued commitment to your health goals. Please review the care plan we discussed, and feel free to reach out if I can assist you further.  Please note that Annual Wellness Visits do not include a physical exam. Some assessments may be limited, especially if the visit was conducted virtually. If needed, we may recommend an in-person follow-up with your provider.  Ongoing Care Seeing your primary care provider every 3 to 6 months helps us  monitor your health and provide consistent, personalized care.   Referrals If a referral was made during today's visit and you haven't received any updates within two weeks, please contact the referred provider directly to check on the status.  Recommended Screenings:  Health Maintenance  Topic Date Due   Medicare Annual Wellness Visit  Never done   HIV Screening  Never done   Hepatitis C Screening  Never done   Zoster (Shingles) Vaccine (1 of 2) Never done   Osteoporosis screening with Bone Density Scan  Never done   COVID-19 Vaccine (4 - 2025-26 season) 01/28/2024   Flu Shot  08/26/2024*   DTaP/Tdap/Td vaccine (1 - Tdap) 02/10/2025*   Pneumococcal Vaccine for age over 63 (2 of 2 - PCV) 02/10/2025*   Breast Cancer Screening  07/15/2024   Colon Cancer Screening  10/19/2026   Hepatitis B Vaccine  Aged Out   Meningitis B Vaccine  Aged Out   Cologuard (Stool DNA test)  Discontinued  *Topic was postponed. The date shown is not the original due date.       05/09/2024    2:18 PM  Advanced Directives  Does Patient Have a Medical Advance Directive? No  Would patient like information on creating a medical advance directive? No - Guardian declined    Vision: Annual vision screenings are recommended for early detection of glaucoma, cataracts, and diabetic retinopathy. These exams can also reveal signs of chronic conditions such as diabetes and high blood  pressure.  Dental: Annual dental screenings help detect early signs of oral cancer, gum disease, and other conditions linked to overall health, including heart disease and diabetes.  Please see the attached documents for additional preventive care recommendations.

## 2024-05-15 NOTE — Telephone Encounter (Signed)
advi

## 2024-05-19 NOTE — Telephone Encounter (Signed)
 Pt advised cough was getting better. Heidi Tran

## 2024-05-30 ENCOUNTER — Other Ambulatory Visit: Payer: Self-pay

## 2024-05-30 ENCOUNTER — Other Ambulatory Visit: Payer: Self-pay | Admitting: Nurse Practitioner

## 2024-05-30 DIAGNOSIS — J4540 Moderate persistent asthma, uncomplicated: Secondary | ICD-10-CM

## 2024-05-30 MED ORDER — FLUTICASONE-SALMETEROL 250-50 MCG/ACT IN AEPB
1.0000 | INHALATION_SPRAY | Freq: Two times a day (BID) | RESPIRATORY_TRACT | 0 refills | Status: DC
  Filled 2024-05-30: qty 60, 30d supply, fill #0

## 2024-05-31 ENCOUNTER — Other Ambulatory Visit (HOSPITAL_COMMUNITY): Payer: Self-pay

## 2024-06-10 ENCOUNTER — Other Ambulatory Visit: Payer: Self-pay

## 2024-06-10 ENCOUNTER — Telehealth: Payer: Self-pay

## 2024-06-10 ENCOUNTER — Other Ambulatory Visit (HOSPITAL_COMMUNITY): Payer: Self-pay

## 2024-06-10 DIAGNOSIS — J4540 Moderate persistent asthma, uncomplicated: Secondary | ICD-10-CM

## 2024-06-10 MED ORDER — FLUTICASONE-SALMETEROL 250-50 MCG/ACT IN AEPB
1.0000 | INHALATION_SPRAY | Freq: Two times a day (BID) | RESPIRATORY_TRACT | 0 refills | Status: AC
  Filled 2024-06-10 (×2): qty 60, 30d supply, fill #0

## 2024-06-10 NOTE — Telephone Encounter (Signed)
 Copied from CRM [id number]. Topic: Clinical - Medication Question >> Jun 10, 2024  2:02 PM Heidi Tran wrote: Reason for CRM: fluticasone -salmeterol (ADVAIR  DISKUS) 250-50 MCG/ACT AEPB patient called in regarding status on her refill, would like a callback

## 2024-08-11 ENCOUNTER — Ambulatory Visit: Payer: Self-pay | Admitting: Nurse Practitioner
